# Patient Record
Sex: Female | Born: 1935 | Race: White | Hispanic: No | Smoking: Never smoker
Health system: Southern US, Community
[De-identification: ages and names within clinical notes are randomized; demographics above are authoritative.]

## PROBLEM LIST (undated history)

## (undated) DIAGNOSIS — F419 Anxiety disorder, unspecified: Secondary | ICD-10-CM

## (undated) DIAGNOSIS — J189 Pneumonia, unspecified organism: Secondary | ICD-10-CM

## (undated) DIAGNOSIS — I10 Essential (primary) hypertension: Secondary | ICD-10-CM

## (undated) DIAGNOSIS — F0390 Unspecified dementia without behavioral disturbance: Secondary | ICD-10-CM

## (undated) DIAGNOSIS — N3281 Overactive bladder: Secondary | ICD-10-CM

## (undated) DIAGNOSIS — I639 Cerebral infarction, unspecified: Secondary | ICD-10-CM

## (undated) HISTORY — PX: JOINT REPLACEMENT: SHX530

## (undated) HISTORY — PX: HIP ADDUCTOR TENOTOMY: SHX1747

---

## 2000-07-25 ENCOUNTER — Ambulatory Visit (HOSPITAL_COMMUNITY): Admission: RE | Admit: 2000-07-25 | Discharge: 2000-07-25 | Payer: Self-pay | Admitting: Ophthalmology

## 2001-10-30 ENCOUNTER — Ambulatory Visit (HOSPITAL_COMMUNITY): Admission: RE | Admit: 2001-10-30 | Discharge: 2001-10-30 | Payer: Self-pay | Admitting: Ophthalmology

## 2002-02-18 ENCOUNTER — Other Ambulatory Visit: Admission: RE | Admit: 2002-02-18 | Discharge: 2002-02-18 | Payer: Self-pay | Admitting: Obstetrics & Gynecology

## 2004-01-17 ENCOUNTER — Inpatient Hospital Stay (HOSPITAL_COMMUNITY): Admission: EM | Admit: 2004-01-17 | Discharge: 2004-01-20 | Payer: Self-pay | Admitting: Emergency Medicine

## 2004-01-20 ENCOUNTER — Ambulatory Visit: Payer: Self-pay | Admitting: Internal Medicine

## 2006-02-22 ENCOUNTER — Emergency Department (HOSPITAL_COMMUNITY): Admission: EM | Admit: 2006-02-22 | Discharge: 2006-02-22 | Payer: Self-pay | Admitting: Emergency Medicine

## 2006-06-16 ENCOUNTER — Emergency Department (HOSPITAL_COMMUNITY): Admission: EM | Admit: 2006-06-16 | Discharge: 2006-06-16 | Payer: Self-pay | Admitting: Emergency Medicine

## 2007-05-14 ENCOUNTER — Ambulatory Visit (HOSPITAL_COMMUNITY): Admission: RE | Admit: 2007-05-14 | Discharge: 2007-05-14 | Payer: Self-pay | Admitting: Pulmonary Disease

## 2007-07-17 ENCOUNTER — Inpatient Hospital Stay (HOSPITAL_COMMUNITY): Admission: EM | Admit: 2007-07-17 | Discharge: 2007-07-23 | Payer: Self-pay | Admitting: Emergency Medicine

## 2008-12-06 ENCOUNTER — Encounter: Payer: Self-pay | Admitting: Emergency Medicine

## 2008-12-07 ENCOUNTER — Inpatient Hospital Stay (HOSPITAL_COMMUNITY): Admission: RE | Admit: 2008-12-07 | Discharge: 2008-12-12 | Payer: Self-pay | Admitting: Internal Medicine

## 2008-12-10 ENCOUNTER — Ambulatory Visit: Payer: Self-pay | Admitting: Physical Medicine & Rehabilitation

## 2008-12-12 ENCOUNTER — Inpatient Hospital Stay (HOSPITAL_COMMUNITY)
Admission: RE | Admit: 2008-12-12 | Discharge: 2008-12-18 | Payer: Self-pay | Admitting: Physical Medicine & Rehabilitation

## 2008-12-12 ENCOUNTER — Ambulatory Visit: Payer: Self-pay | Admitting: Physical Medicine & Rehabilitation

## 2008-12-18 ENCOUNTER — Inpatient Hospital Stay: Admission: AD | Admit: 2008-12-18 | Discharge: 2009-02-05 | Payer: Self-pay | Admitting: Pulmonary Disease

## 2010-03-09 ENCOUNTER — Inpatient Hospital Stay (HOSPITAL_COMMUNITY)
Admission: RE | Admit: 2010-03-09 | Discharge: 2010-03-15 | DRG: 563 | Disposition: A | Discharge status: Skilled Nursing Facility | Payer: Medicare Other | Source: Ambulatory Visit | Attending: Pulmonary Disease | Admitting: Pulmonary Disease

## 2010-03-09 ENCOUNTER — Emergency Department (HOSPITAL_COMMUNITY): Payer: Medicare Other

## 2010-03-09 DIAGNOSIS — F3289 Other specified depressive episodes: Secondary | ICD-10-CM | POA: Diagnosis present

## 2010-03-09 DIAGNOSIS — E785 Hyperlipidemia, unspecified: Secondary | ICD-10-CM | POA: Diagnosis present

## 2010-03-09 DIAGNOSIS — N390 Urinary tract infection, site not specified: Secondary | ICD-10-CM | POA: Diagnosis present

## 2010-03-09 DIAGNOSIS — S42253A Displaced fracture of greater tuberosity of unspecified humerus, initial encounter for closed fracture: Secondary | ICD-10-CM | POA: Diagnosis present

## 2010-03-09 DIAGNOSIS — W19XXXA Unspecified fall, initial encounter: Secondary | ICD-10-CM | POA: Diagnosis present

## 2010-03-09 DIAGNOSIS — F329 Major depressive disorder, single episode, unspecified: Secondary | ICD-10-CM | POA: Diagnosis present

## 2010-03-09 DIAGNOSIS — Y92009 Unspecified place in unspecified non-institutional (private) residence as the place of occurrence of the external cause: Secondary | ICD-10-CM

## 2010-03-09 DIAGNOSIS — Z8673 Personal history of transient ischemic attack (TIA), and cerebral infarction without residual deficits: Secondary | ICD-10-CM

## 2010-03-09 DIAGNOSIS — S42213A Unspecified displaced fracture of surgical neck of unspecified humerus, initial encounter for closed fracture: Principal | ICD-10-CM | POA: Diagnosis present

## 2010-03-09 DIAGNOSIS — F411 Generalized anxiety disorder: Secondary | ICD-10-CM | POA: Diagnosis present

## 2010-03-09 DIAGNOSIS — I10 Essential (primary) hypertension: Secondary | ICD-10-CM | POA: Diagnosis present

## 2010-03-10 ENCOUNTER — Encounter: Payer: Self-pay | Admitting: Orthopedic Surgery

## 2010-03-10 DIAGNOSIS — S42213A Unspecified displaced fracture of surgical neck of unspecified humerus, initial encounter for closed fracture: Secondary | ICD-10-CM

## 2010-03-11 LAB — URINALYSIS, ROUTINE W REFLEX MICROSCOPIC
Bilirubin Urine: NEGATIVE
Nitrite: POSITIVE — AB
Specific Gravity, Urine: >1.03 — ABNORMAL HIGH (ref 1.005–1.030)
Urine Glucose, Fasting: NEGATIVE mg/dL
Urobilinogen, UA: 0.2 mg/dL (ref 0.0–1.0)
pH: 5.5 (ref 5.0–8.0)

## 2010-03-11 LAB — URINE MICROSCOPIC-ADD ON

## 2010-03-12 LAB — CBC
HCT: 34.7 % — ABNORMAL LOW (ref 36.0–46.0)
Hemoglobin: 11.6 g/dL — ABNORMAL LOW (ref 12.0–15.0)
MCH: 30.8 pg (ref 26.0–34.0)
MCHC: 33.4 g/dL (ref 30.0–36.0)
RBC: 3.77 MIL/uL — ABNORMAL LOW (ref 3.87–5.11)
RDW: 12.2 % (ref 11.5–15.5)
WBC: 9.1 10*3/uL (ref 4.0–10.5)

## 2010-03-12 LAB — DIFFERENTIAL
Basophils Absolute: 0 10*3/uL (ref 0.0–0.1)
Basophils Relative: 0 % (ref 0–1)
Eosinophils Absolute: 0 10*3/uL (ref 0.0–0.7)
Eosinophils Relative: 0 % (ref 0–5)
Lymphocytes Relative: 15 % (ref 12–46)
Lymphs Abs: 1.4 10*3/uL (ref 0.7–4.0)
Monocytes Absolute: 1 10*3/uL (ref 0.1–1.0)
Monocytes Relative: 12 % (ref 3–12)
Neutro Abs: 6.6 10*3/uL (ref 1.7–7.7)
Neutrophils Relative %: 73 % (ref 43–77)

## 2010-03-12 LAB — COMPREHENSIVE METABOLIC PANEL
ALT: 10 U/L (ref 0–35)
AST: 19 U/L (ref 0–37)
CO2: 27 mEq/L (ref 19–32)
Calcium: 9 mg/dL (ref 8.4–10.5)
Chloride: 100 mEq/L (ref 96–112)
GFR calc Af Amer: >60 mL/min (ref >60)
GFR calc non Af Amer: >60 mL/min (ref >60)
Glucose, Bld: 142 mg/dL — ABNORMAL HIGH (ref 70–99)
Sodium: 137 mEq/L (ref 135–145)
Total Bilirubin: 1.9 mg/dL — ABNORMAL HIGH (ref 0.3–1.2)
Total Protein: 7.1 g/dL (ref 6.0–8.3)

## 2010-03-14 LAB — URINE CULTURE
Colony Count: >=100000
Culture  Setup Time: 201202240151

## 2010-03-15 ENCOUNTER — Inpatient Hospital Stay
Admission: RE | Admit: 2010-03-15 | Discharge: 2010-04-17 | Disposition: A | Discharge status: Home / Self Care | Payer: Medicare Other | Source: Ambulatory Visit | Attending: Pulmonary Disease | Admitting: Pulmonary Disease

## 2010-03-15 ENCOUNTER — Encounter: Payer: Self-pay | Admitting: Orthopedic Surgery

## 2010-03-15 DIAGNOSIS — Z8701 Personal history of pneumonia (recurrent): Secondary | ICD-10-CM

## 2010-03-15 DIAGNOSIS — T148XXA Other injury of unspecified body region, initial encounter: Principal | ICD-10-CM

## 2010-03-15 DIAGNOSIS — R05 Cough: Secondary | ICD-10-CM

## 2010-03-16 NOTE — Discharge Summary (Signed)
  [name], [name]               ACCOUNT NO.:  [id number]  MEDICAL RECORD NO.:  [id number]           PATIENT TYPE:  I  LOCATION:  [id number]                          FACILITY:  [facility]  PHYSICIAN:  Deanza Upperman L. Luan Pulling, M.D.DATE OF BIRTH:  06/08/1920  DATE OF ADMISSION:  03/09/2010 DATE OF DISCHARGE:  LH                         DISCHARGE SUMMARY-REFERRING   [name] is potentially being transferred to a skilled care facility today.  HISTORY:  [name] is a 77 year old who has a fractured humerus. Her discharge diagnoses are: 1. Fractured humerus. 2. Status post hip fracture. 3. Status post knee fracture. 4. Inability to ambulate. 5. Urinary tract infection. 6. Hypertension. 7. Anxiety. 8. History of cerebrovascular accident. 9. Hyperlipidemia. 10.Depression.  This is a 77 year old who fell at home and suffered a fracture of her humerus.  [name] had been having severe problems with her leg.  [name] is not able to ambulate without using a walker and [name] cannot use the walker with her fracture in the humerus.  [name] has a history of CVA in the past. [name] is complaining of severe pain.  [name] has not been able to ambulate even with her walker because [name] cannot use it.  PHYSICAL EXAMINATION:  GENERAL:  [name] is very anxious, uncomfortable, complaining of pain.  [name] also complained of depression.  Her shoulder was in an immobilizer. CHEST:  Relatively clear. HEART:  Regular without gallop. ABDOMEN:  Bowel sounds present and active.  HOSPITAL COURSE:  [name] had IV fluids.  [name] was started on nerve medications, started on medications for depression.  [name] was given IV pain medication, had Orthopedic consultation.  The orthopedist said that [name] was not going to be able to bear any weight on that shoulder for at least 2-3 weeks.  This imposes a great deal of difficulty in that [name] has to use a walker to ambulate.  [name] can use the walker with her shoulder in the immobilizer and with no  weightbearing.  [name] did complain of urinary tract infection.  [name] complained of some discomfort in her abdomen after [name] was started on antibiotics and at this point, plans are for her to be transferred to a skilled care facility on a no added salt regular diet.  [name] will have PT, OT, and speech as needed.  [name] will have: 1. Amlodipine 10 mg daily. 2. Benazepril 20 mg daily. 3. Protonix 40 mg daily. 4. Zoloft 50 mg daily. 5. Xanax 0.25 mg q.4 h. p.r.n. pain. 6. Tums 2 q.i.d. p.r.n. indigestion. 7. Hydrocodone 10/325 q.4 h. p.r.n. more severe pain. 8. Zofran 4 mg p.o. q.6 h. p.r.n. nausea. 9. [name] will be on Cipro 250 mg p.o. b.i.d. x5 more days.     Fransisca Shawn L. Luan Pulling, M.D.     ELH/MEDQ  D:  03/12/2010  T:  03/12/2010  Job:  [id number]  Electronically Signed by Sinda Du M.D. on 03/16/2010 05:57:39 PM

## 2010-03-16 NOTE — Discharge Summary (Signed)
  NAMECARLEENA, NEUPERT               ACCOUNT NO.:  [id number]  MEDICAL RECORD NO.:  [id number]           PATIENT TYPE:  LOCATION:                                 FACILITY:  PHYSICIAN:  Quentina Fronek L. Luan Pulling, M.D.DATE OF BIRTH:  1921-01-03  DATE OF ADMISSION: DATE OF DISCHARGE:  LH                         DISCHARGE SUMMARY-REFERRING   ADDENDUM  This is an addendum to the discharge summary that was done earlier.  The patient was approved for inpatient admission.  She had some more problems with nausea, ended up having to take her Cipro IV initially, and then she had some more problems with depression.  All of that is better.  She is much improved, and she is ready for transfer to the skilled care facility on March 15, 2010.     Orella Cushman L. Luan Pulling, M.D.     ELH/MEDQ  D:  03/15/2010  T:  03/15/2010  Job:  [id number]  Electronically Signed by Sinda Du M.D. on 03/16/2010 05:57:47 PM

## 2010-03-16 NOTE — Progress Notes (Signed)
  [name], [name]               ACCOUNT NO.:  [id number]  MEDICAL RECORD NO.:  [id number]           PATIENT TYPE:  I  LOCATION:  [id number]                          FACILITY:  [facility]  PHYSICIAN:  Blessing Zaucha L. Luan Pulling, M.D.DATE OF BIRTH:  01-31-1920  DATE OF PROCEDURE: DATE OF DISCHARGE:                                PROGRESS NOTE   Ms. [name] was admitted with a fractured left humerus.  She is not able to use her arm at all.  She also had a urinary tract infection.  She seems to be doing a little bit better, although she still complains of pretty severe nausea.  Her exam otherwise shows that her temperature is 98.2, pulse 70, respirations 18, blood pressure 115/76, O2 sats 96% on 2 liters.  Her chest is clear.  Heart is regular.  Abdomen is soft.  Extremities showed no edema and overall, I think she is better.  My assessment is that she seems to be somewhat better.  She still complaining of nausea.  She still cannot use her arm.  I am gratified that she has been approved for inpatient status and she is set for transfer to the [facility] on Monday, March 15, 2010.     Keasha Malkiewicz L. Luan Pulling, M.D.     ELH/MEDQ  D:  03/13/2010  T:  03/13/2010  Job:  [id number]  Electronically Signed by Sinda Du M.D. on 03/16/2010 05:58:00 PM

## 2010-03-16 NOTE — Progress Notes (Signed)
  NAMEROBERTA, [name]               ACCOUNT NO.:  [id number]  MEDICAL RECORD NO.:  [id number]           PATIENT TYPE:  I  LOCATION:  [id number]                          FACILITY:  [facility]  PHYSICIAN:  Ricka Westra L. Luan Pulling, M.D.DATE OF BIRTH:  10-21-1920  DATE OF PROCEDURE: DATE OF DISCHARGE:                                PROGRESS NOTE   This patient was admitted with a fracture.  She says she is doing a little better.  She clearly has problems with depression.  I think because of her multiple problems.  My plan then is to continue with her current treatments and medications.  PHYSICAL EXAMINATION:  GENERAL:  Her exam shows that she is awake and alert.  She is in a sling. VITAL SIGNS:  Temperature is 98.6, pulse 85, respirations 16, blood pressure 129/73.  Assessment is that she has had a fracture.  She has problems with her legs.  She has been using a walker.  She is not able to use a walker because of the fracture and we are working on placement issues.     [name] L. Luan Pulling, M.D.     ELH/MEDQ  D:  03/11/2010  T:  03/12/2010  Job:  [id number]  Electronically Signed by Sinda Du M.D. on 03/16/2010 05:57:50 PM

## 2010-03-16 NOTE — Progress Notes (Signed)
  [name], [name]               ACCOUNT NO.:  [id number]  MEDICAL RECORD NO.:  [id number]           PATIENT TYPE:  LOCATION:                                 FACILITY:  PHYSICIAN:  Genova Kiner L. Luan Pulling, M.D.DATE OF BIRTH:  1920-09-29  DATE OF PROCEDURE: DATE OF DISCHARGE:                                PROGRESS NOTE   ADDENDUM  [name] is complaining that the Cipro is bothering her stomach, so I am going to switch it to IV now, so she will be on IV Cipro at this point.     Aliha Diedrich L. Luan Pulling, M.D.     ELH/MEDQ  D:  03/12/2010  T:  03/12/2010  Job:  [id number]  Electronically Signed by Sinda Du M.D. on 03/16/2010 05:58:09 PM

## 2010-03-16 NOTE — Progress Notes (Addendum)
  [name], [name]               ACCOUNT NO.:  [id number]  MEDICAL RECORD NO.:  [id number]           PATIENT TYPE:  LOCATION:                                FACILITY:  [facility]  PHYSICIAN:  Tariyah Pendry L. Luan Pulling, M.D.DATE OF BIRTH:  [birth year]  DATE OF PROCEDURE:  03/15/2010 DATE OF DISCHARGE:                                PROGRESS NOTE   [name] is much improved this morning.  She has less complaints of depression.  She looks better and says she feels better.  We have got her set for transfer to the [facility] later today and I will see how she does there.     Zakaria Sedor L. Luan Pulling, M.D.     ELH/MEDQ  D:  03/15/2010  T:  03/15/2010  Job:  [id number]  Electronically Signed by Sinda Du M.D. on 03/16/2010 05:58:15 PM

## 2010-03-16 NOTE — H&P (Signed)
  NAMEBRITNY, [name]               ACCOUNT NO.:  [id number]  MEDICAL RECORD NO.:  [id number]           PATIENT TYPE:  I  LOCATION:  [id number]                          FACILITY:  [facility]  PHYSICIAN:  Mustafa Potts L. Luan Pulling, M.D.DATE OF BIRTH:  Nov 17, 1920  DATE OF ADMISSION:  03/09/2010 DATE OF DISCHARGE:  LH                             HISTORY & PHYSICAL   REASON FOR ADMISSION:  Fractured humerus.  HISTORY:  Ms. [name] is an 77 year old who has fallen at home and suffered a fracture of her humerus.  The difficulty has been that she also has severe problems with her leg and she has been using a walker at home and she cannot use the walker with the fracture.  She had had severe problems with her leg and eventually went home and has been using a walker and a knee immobilizer.  She has a history of CVA in the past as well.  She is having severe pain.  She is not able to ambulate even with her walker because she cannot use it.  PAST MEDICAL HISTORY:  Positive for hypertension, anxiety, previous CVA, and hyperlipidemia.  PAST SURGICAL HISTORY:  She has had a cholecystectomy and surgeries on her leg.  SOCIAL HISTORY:  She does not smoke.  She does not use any alcohol.  She does not use any illicit drugs.  MEDICATIONS:  At home, she takes Lotrel 10/20 daily and Valium 5 mg as needed.  REVIEW OF SYSTEMS:  Except as mentioned is negative.  PHYSICAL EXAMINATION:  GENERAL:  She is very anxious.  She is uncomfortable and complaining of pain.  Her shoulders is in an immobilizer. HEENT:  Her pupils are reactive.  Nose and throat are clear.  Mucous membranes are moist. NECK:  Supple without masses. CHEST:  Fairly clear without wheezes, rales, or rhonchi. HEART:  Regular without murmur, gallop, or rub. ABDOMEN:  Soft with no masses.  Bowel sounds are present and active. EXTREMITIES:  She is in an immobilizer.  She is tender in the shoulder.  ASSESSMENT:  She has problems with ambulation now.  She  had a fracture. I do not think she is going to be able to manage it at home and I think she is probably going to end up having to be back in a skilled care facility, at least short-term until this heals.  PLAN:  Go ahead and get Orthopedic consultation.  I am going to give her some IV fluids.  Give her something for her nerves, start her on something for depression.  Continue with everything else and follow.     Naiyah Klostermann L. Luan Pulling, M.D.     ELH/MEDQ  D:  03/10/2010  T:  03/11/2010  Job:  [id number]  Electronically Signed by Sinda Du M.D. on 03/16/2010 05:57:36 PM

## 2010-03-16 NOTE — Progress Notes (Signed)
  NAMENERISSA, STARR               ACCOUNT NO.:  [id number]  MEDICAL RECORD NO.:  [id number]           PATIENT TYPE:  I  LOCATION:  [id number]                          FACILITY:  [facility]  PHYSICIAN:  Jing Howatt L. Luan Pulling, M.D.DATE OF BIRTH:  [birth year]  DATE OF PROCEDURE: DATE OF DISCHARGE:                                PROGRESS NOTE   Ms. Lindblom was overall about the same.  She is more depressed this morning.  I have told her that I think we need to simply push through this.  I understand her problem, but that she was much better before.  I think she had more problems with pain, so I am going to switch her from her current morphine to some Dilaudid and see if that helps.  Otherwise, everything is about the same and she has no other new complaints.  Exam shows that her temperature is 98.5, pulse 79, respirations 24, blood pressure 135/68, O2 sats 98% on 2 L.  Her chest is clear.  She looks fairly comfortable, but she is obviously very depressed.  ASSESSMENT:  She has got lot of problems with depression.  She has pain in her arm.  I am going to have her change her pain medication.  She is going to stay on the Zoloft and see how she does.     Rubin Dais L. Luan Pulling, M.D.     ELH/MEDQ  D:  03/14/2010  T:  03/14/2010  Job:  [id number]  Electronically Signed by Sinda Du M.D. on 03/16/2010 05:58:03 PM

## 2010-03-16 NOTE — Progress Notes (Signed)
  NAMEMEGEN, SHECKLER               ACCOUNT NO.:  [id number]  MEDICAL RECORD NO.:  [id number]           PATIENT TYPE:  I  LOCATION:  [id number]                          FACILITY:  [facility]  PHYSICIAN:  Averie Meiner L. Luan Pulling, M.D.DATE OF BIRTH:  16-Jan-1921  DATE OF PROCEDURE: DATE OF DISCHARGE:                                PROGRESS NOTE   I have reviewed all the information on Ms. Joens.  She appears to have a urinary tract infection.  She did not have any lab work done when she came into the hospital, I am going to order a CBC and CMET today.  She is very weak.  She certainly could be hypokalemic.  My concern is the fact that she lives alone.  She already was using a walker for ambulation and even with that was somewhat unsteady as evidenced by her fall.  Now, she has a fractured humerus and according to Dr. Ruthe Mannan note, she is not going to be able to bear weight on her humerus for at least 2-3 weeks and perhaps longer than that.  I simply do not see any way that she is going to be able to be anywhere other than a skilled care facility to receive PT, OT, etc.  With her inability to walk at home obviously, she is unable to prepare meals, she is unable to toilet herself, etc.  She needs to be in the hospital in my opinion.  I do not think that having her at home is safe and I am concerned about her well- being.  I understand they are concerned about whether she should be considered a "observation" patient or a full inpatient, but I think with the patient who cannot ambulate, who cannot use her left arm, who cannot get out of bed, who has a urinary tract infection and multiple other comorbidities including severe depression.  I do not think that we cannot have her fully admitted as an inpatient.  I discussed the situation with the medical director for her insurance in our facility yesterday, but I see no other choice.     Chundra Sauerwein L. Luan Pulling, M.D.     ELH/MEDQ  D:  03/12/2010  T:   03/12/2010  Job:  [id number]  Electronically Signed by Sinda Du M.D. on 03/16/2010 05:57:55 PM

## 2010-03-21 ENCOUNTER — Inpatient Hospital Stay (HOSPITAL_COMMUNITY): Payer: PRIVATE HEALTH INSURANCE

## 2010-03-21 ENCOUNTER — Inpatient Hospital Stay (HOSPITAL_COMMUNITY): Payer: PRIVATE HEALTH INSURANCE | Attending: Pulmonary Disease

## 2010-03-21 ENCOUNTER — Ambulatory Visit (HOSPITAL_COMMUNITY)
Admission: RE | Admit: 2010-03-21 | Discharge: 2010-03-21 | Disposition: A | Discharge status: Home / Self Care | Payer: Medicare Other | Source: Ambulatory Visit | Attending: Pulmonary Disease | Admitting: Pulmonary Disease

## 2010-03-23 NOTE — Consult Note (Addendum)
[name], [name]               ACCOUNT NO.:  [id number]  MEDICAL RECORD NO.:  [id number]           PATIENT TYPE:  I  LOCATION:  [id number]                          FACILITY:  [facility]  PHYSICIAN:  Carole Civil, M.D.DATE OF BIRTH:  09/15/1920  DATE OF CONSULTATION:  03/10/2010 DATE OF DISCHARGE:                                CONSULTATION   REQUESTING PHYSICIAN:  Jasper Loser. Luan Pulling, MD  DATE OF INJURY:  March 09, 2010  CHIEF COMPLAINT:  Pain, left shoulder.  The patient is noted to have had previous right distal femur fracture ORIF in November.  She had a right subcapital hip fracture, treated with bipolar prosthesis.  She is 77 years old.  Hip fracture was in 2009, fixed by Dr. Luna Glasgow.  Distal femur fracture was fixed in [place]. Had a history of stroke with right upper extremity mild weakness in 2005.  She had some postoperative chronic anemia, E. coli urinary tract infection, hypertension, hyperlipidemia.  She tells me she lives alone at home.  She has had a family history of coronary artery disease.  REVIEW OF SYSTEMS:  Negative.  She ambulates with a walker.  PHYSICAL EXAMINATION:  GENERAL APPEARANCE:  She is of small frame. Normal upper extremity pulse, perfusion, capillary refill.  Strong radial and ulnar pulse. NECK:  Cervical area lympha nodes normal. SKIN:  Bruising in the proximal humeral area.  Elbow, wrist, and hand normal. NEUROLOGIC:  Normal sensation in the left upper extremity. The patient is awake, alert, and oriented x3.  Mood and affect normal. EXTREMITIES:  The left arm is in a sling.  She has minimal range of motion and painful range of motion with normal muscle tone, and the elbow, wrist, and hand are stable.  X-rays show a left proximal humerus fracture with involvement of the greater tuberosity and surgical neck.  The fracture meets the Neer criteria for nonoperative treatment.  Recommend sling for 2 weeks and repeat AP and lateral left  shoulder.  If fracture stable, the patient can weight bear with a platform walker at that point.  Until that point, however, she will not be able to use her left upper extremity for weightbearing.  This has been discussed with the patient and her family members who were present.  Call [phone] if there are any questions regarding treatment of the fracture.     Carole Civil, M.D.     SEH/MEDQ  D:  03/10/2010  T:  03/11/2010  Job:  [id number]  Electronically Signed by Arther Abbott M.D. on 03/23/2010 11:40:43 AM Electronically Signed by Arther Abbott M.D. on 03/23/2010 12:00:33 PM Electronically Signed by Arther Abbott M.D. on 03/23/2010 12:15:00 PM Electronically Signed by Arther Abbott M.D. on 03/23/2010 12:30:57 PM Electronically Signed by Arther Abbott M.D. on 03/23/2010 12:48:23 PM Electronically Signed by Arther Abbott M.D. on 03/23/2010 [id number] PM Electronically Signed by Arther Abbott M.D. on 03/23/2010 01:27:00 PM Electronically Signed by Arther Abbott M.D. on 03/23/2010 01:48:17 PM Electronically Signed by Arther Abbott M.D. on 03/23/2010 02:08:03 PM Electronically Signed by Arther Abbott M.D. on 03/23/2010 02:28:39 PM Electronically Signed by Arther Abbott M.D. on 03/23/2010 02:51:41  PM Electronically Signed by Arther Abbott M.D. on 03/23/2010 03:15:57 PM Electronically Signed by Arther Abbott M.D. on 03/23/2010 03:40:41 PM Electronically Signed by Arther Abbott M.D. on 03/23/2010 04:17:06 PM Electronically Signed by Arther Abbott M.D. on 03/23/2010 04:48:09 PM Electronically Signed by Arther Abbott M.D. on 03/23/2010 07:32:31 PM

## 2010-03-31 ENCOUNTER — Ambulatory Visit (HOSPITAL_COMMUNITY): Payer: Medicare Other | Attending: Pulmonary Disease

## 2010-03-31 ENCOUNTER — Telehealth: Payer: Self-pay | Admitting: Orthopedic Surgery

## 2010-03-31 ENCOUNTER — Encounter: Payer: Self-pay | Admitting: Orthopedic Surgery

## 2010-03-31 ENCOUNTER — Ambulatory Visit (HOSPITAL_COMMUNITY): Payer: Medicare Other

## 2010-03-31 DIAGNOSIS — M25519 Pain in unspecified shoulder: Secondary | ICD-10-CM | POA: Insufficient documentation

## 2010-04-05 ENCOUNTER — Ambulatory Visit: Payer: Medicare Other | Admitting: Orthopedic Surgery

## 2010-04-05 ENCOUNTER — Encounter: Payer: Self-pay | Admitting: Orthopedic Surgery

## 2010-04-06 NOTE — Progress Notes (Signed)
Summary: call from [place] following Xray   Phone Note Other Incoming   Caller: [facility] Summary of Call: Inez Catalina from [place] - Ph [phone] - called to relay that patient was taken for her Xray today, Forestine Na.  Copy of Xray report faxed and scanned into chart.  Asking for Dr to review and advise.  Initial call taken by: Ihor Austin,  March 31, 2010 2:47 PM  Follow-up for Phone Call        i dont know does she needa n appt ????? Follow-up by: Arther Abbott MD,  March 31, 2010 3:03 PM  Additional Follow-up for Phone Call Additional follow up Details #1::        Consult note in chart dated 03/10/10 states (after this repeat Xray)   "if fracture stable, patient may weight bear with a platform walker at that point, however, until that point will not be able to use her left upper extremity for weight bearing." ( Patient also had hx of previous RT distal femur FX OR)IF in November 2011.  Please review Xray of 03/31/10 and confirm if appointment needed or not needed. Additional Follow-up by: Ihor Austin,  March 31, 2010 6:20 PM    Additional Follow-up for Phone Call Additional follow up Details #2::    patient may weight bear with a platform walker Follow-up by: Arther Abbott MD,  April 01, 2010 8:47 AM  Additional Follow-up for Phone Call Additional follow up Details #3:: Details for Additional Follow-up Action Taken: called back to [facility]; spoke with patient's nurse, Sharyn Lull; advised per above. Additional Follow-up by: Ihor Austin,  April 01, 2010 5:07 PM

## 2010-04-06 NOTE — Consult Note (Signed)
Summary: Hospital consult LT shoulder  Hospital consult LT shoulder   Imported By: Ihor Austin 03/31/2010 13:41:58  _____________________________________________________________________  External Attachment:    Type:   Image     Comment:   External Document

## 2010-04-08 NOTE — H&P (Signed)
  [name], [name]               ACCOUNT NO.:  [id number]  MEDICAL RECORD NO.:  [id number]           PATIENT TYPE:  I  LOCATION:  S104                          FACILITY:  [facility]  PHYSICIAN:  Leola Fiore L. Luan [name], M.D.DATE OF BIRTH:  [birth year]  DATE OF ADMISSION:  03/15/2010 DATE OF DISCHARGE:  LH                             HISTORY & PHYSICAL   The patient at the [facility].  [name] was admitted to the [facility] because she has a fractured humerus.  She has also had severe problems with her leg.  She is admitted for rehabilitation.  She had some problems with anxiety and depression, but that seems to be a little bit better now.  Her past medical history is positive for previous hip fracture, previous fracture in her knee for the humeral fracture, anxiety, and depression.  Her social history, she has been living at home alone.  She has a daughter who is about 25 miles away that who visits frequently.  She does not smoke.  She does not use any alcohol.  Does not use any illicit drugs.  FAMILY HISTORY:  Not known to be positive for osteoporosis.  REVIEW OF SYSTEMS:  Except as mentioned is negative.  PHYSICAL EXAMINATION:  GENERAL:  She is a well-developed, well-nourished female who is in no acute distress.  She has a sling on her left arm. HEENT:  Her pupils are reactive.  Nose and throat are clear.  Mucous membranes are moist. CHEST:  Shows some minimal rhonchi. HEART:  Regular. ABDOMEN:  Soft.  ASSESSMENT:  She had a fracture in the humerus.  She has difficulty with managing her walker.  She has been admitted to the skilled care facility for rehabilitation.     [name], M.D.    ELH/MEDQ  D:  03/17/2010  T:  03/17/2010  Job:  [id number]  Electronically Signed by Sinda Du M.D. on 04/08/2010 09:11:15 AM

## 2010-04-08 NOTE — Progress Notes (Signed)
  [name], [name]               ACCOUNT NO.:  [id number]  MEDICAL RECORD NO.:  [id number]           PATIENT TYPE:  LOCATION:                                 FACILITY:  PHYSICIAN:  Krosby Ritchie L. Luan Pulling, M.D.DATE OF BIRTH:  02/10/1920  DATE OF PROCEDURE:  04/04/2010 DATE OF DISCHARGE:                                PROGRESS NOTE   [name] is doing better.  She has no new complaints.  She is hopeful that she is going to be ready for discharge fairly soon.  She has no new problems.  Her arm seems to be healing, she is trying to work on learning how to walk with a walker with a raised platform.  Her chest is clear.  Heart is regular.  Abdomen is soft.  She looks pretty comfortable.  ASSESSMENT:  She has multiple problems including a fractured humerus. She has had severe problems with her leg.  She has anxiety.  She has depression in the past and overall I think she is better now.  My plan then is to continue with her treatment.  She is hopeful of going home fairly soon.  We will see what we can arrange as far as that is concerned.     Tyleek Smick L. Luan Pulling, M.D.     ELH/MEDQ  D:  04/04/2010  T:  04/04/2010  Job:  [id number]  Electronically Signed by Sinda Du M.D. on 04/08/2010 09:11:18 AM

## 2010-04-09 ENCOUNTER — Inpatient Hospital Stay (HOSPITAL_COMMUNITY): Payer: Medicare Other | Attending: Pulmonary Disease

## 2010-04-15 NOTE — Letter (Signed)
Summary: Disch Summ 03/15/10 and 03/09/10  Disch Summ 03/15/10 and 03/09/10   Imported By: Ihor Austin 04/04/2010 11:45:22  _____________________________________________________________________  External Attachment:    Type:   Image     Comment:   External Document

## 2010-04-20 LAB — PROTIME-INR
INR: 1.53 — ABNORMAL HIGH (ref 0.00–1.49)
Prothrombin Time: 20.8 seconds — ABNORMAL HIGH (ref 11.6–15.2)

## 2010-04-20 NOTE — Miscellaneous (Signed)
Summary: Nursing Home orders  Nursing Home orders   Imported By: Ihor Austin 04/13/2010 19:29:59  _____________________________________________________________________  External Attachment:    Type:   Image     Comment:   External Document

## 2010-04-21 LAB — URINE MICROSCOPIC-ADD ON

## 2010-04-21 LAB — COMPREHENSIVE METABOLIC PANEL
AST: 15 U/L (ref 0–37)
Albumin: 3.1 g/dL — ABNORMAL LOW (ref 3.5–5.2)
Calcium: 8.5 mg/dL (ref 8.4–10.5)
Chloride: 105 mEq/L (ref 96–112)
Creatinine, Ser: 0.75 mg/dL (ref 0.4–1.2)
GFR calc Af Amer: >60 mL/min (ref >60)
Total Protein: 5.7 g/dL — ABNORMAL LOW (ref 6.0–8.3)

## 2010-04-21 LAB — CBC
HCT: 38.6 % (ref 36.0–46.0)
HCT: 26.7 % — ABNORMAL LOW (ref 36.0–46.0)
HCT: 28.8 % — ABNORMAL LOW (ref 36.0–46.0)
Hemoglobin: 13.3 g/dL (ref 12.0–15.0)
Hemoglobin: 9.3 g/dL — ABNORMAL LOW (ref 12.0–15.0)
MCHC: 34.5 g/dL (ref 30.0–36.0)
MCHC: 34.6 g/dL (ref 30.0–36.0)
MCHC: 34.8 g/dL (ref 30.0–36.0)
MCHC: 35.2 g/dL (ref 30.0–36.0)
MCV: 93.8 fL (ref 78.0–100.0)
MCV: 94.2 fL (ref 78.0–100.0)
MCV: 94.5 fL (ref 78.0–100.0)
MCV: 94.6 fL (ref 78.0–100.0)
Platelets: 350 10*3/uL (ref 150–400)
RBC: 4.11 MIL/uL (ref 3.87–5.11)
RBC: 3.05 MIL/uL — ABNORMAL LOW (ref 3.87–5.11)
RDW: 12.1 % (ref 11.5–15.5)
WBC: 12.4 10*3/uL — ABNORMAL HIGH (ref 4.0–10.5)
WBC: 7 10*3/uL (ref 4.0–10.5)
WBC: 7.1 10*3/uL (ref 4.0–10.5)

## 2010-04-21 LAB — DIFFERENTIAL
Basophils Relative: 0 % (ref 0–1)
Eosinophils Absolute: 0 10*3/uL (ref 0.0–0.7)
Eosinophils Relative: 0 % (ref 0–5)
Eosinophils Relative: 4 % (ref 0–5)
Lymphocytes Relative: 24 % (ref 12–46)
Lymphs Abs: 2.4 10*3/uL (ref 0.7–4.0)
Lymphs Abs: 1.7 10*3/uL (ref 0.7–4.0)
Monocytes Absolute: 0.6 10*3/uL (ref 0.1–1.0)
Monocytes Absolute: 0.8 10*3/uL (ref 0.1–1.0)
Monocytes Relative: 5 % (ref 3–12)
Monocytes Relative: 12 % (ref 3–12)

## 2010-04-21 LAB — BASIC METABOLIC PANEL
BUN: 8 mg/dL (ref 6–23)
CO2: 26 mEq/L (ref 19–32)
CO2: 29 mEq/L (ref 19–32)
CO2: 31 mEq/L (ref 19–32)
Chloride: 104 mEq/L (ref 96–112)
Chloride: 101 mEq/L (ref 96–112)
GFR calc Af Amer: >60 mL/min (ref >60)
GFR calc Af Amer: >60 mL/min (ref >60)
Glucose, Bld: 116 mg/dL — ABNORMAL HIGH (ref 70–99)
Potassium: 3.1 mEq/L — ABNORMAL LOW (ref 3.5–5.1)
Potassium: 3.4 mEq/L — ABNORMAL LOW (ref 3.5–5.1)
Potassium: 4 mEq/L (ref 3.5–5.1)
Sodium: 136 mEq/L (ref 135–145)

## 2010-04-21 LAB — URINALYSIS, MICROSCOPIC ONLY
Glucose, UA: NEGATIVE mg/dL
Hgb urine dipstick: NEGATIVE
Specific Gravity, Urine: 1.021 (ref 1.005–1.030)
Urobilinogen, UA: 0.2 mg/dL (ref 0.0–1.0)

## 2010-04-21 LAB — URINE CULTURE: Culture: NO GROWTH

## 2010-04-21 LAB — PROTIME-INR
INR: 0.98 (ref 0.00–1.49)
INR: 1.91 — ABNORMAL HIGH (ref 0.00–1.49)
INR: 2.32 — ABNORMAL HIGH (ref 0.00–1.49)
INR: 2.4 — ABNORMAL HIGH (ref 0.00–1.49)
Prothrombin Time: 25.3 seconds — ABNORMAL HIGH (ref 11.6–15.2)
Prothrombin Time: 26 seconds — ABNORMAL HIGH (ref 11.6–15.2)

## 2010-04-21 LAB — URINALYSIS, ROUTINE W REFLEX MICROSCOPIC
Bilirubin Urine: NEGATIVE
Specific Gravity, Urine: 1.025 (ref 1.005–1.030)
Urobilinogen, UA: 0.2 mg/dL (ref 0.0–1.0)
pH: 6 (ref 5.0–8.0)

## 2010-06-01 NOTE — Group Therapy Note (Signed)
NAMECELIE, GIROLAMO               ACCOUNT NO.:  [id number]   MEDICAL RECORD NO.:  [id number]          PATIENT TYPE:  INP   LOCATION:  [id number]                          FACILITY:  [facility]   PHYSICIAN:  Edward L. Luan Pulling, M.D.DATE OF BIRTH:  Jun 13, 1920   DATE OF PROCEDURE:  DATE OF DISCHARGE:                                 PROGRESS NOTE   Ms. Romanek seen with broken hip and seems to be doing well.  She is not  comfortable, but she is doing well.   Her physical examination shows temperature is 98, pulse 78, respirations  20, blood pressure 118/60, O2 sats 99%.  Her chest is clear.  She looks  better.   ASSESSMENT:  She is doing better.   PLAN:  She is being set up for transfer to a skilled care facility, but  we do not have a bed as yet.      Edward L. Luan Pulling, M.D.  Electronically Signed     ELH/MEDQ  D:  07/20/2007  T:  07/20/2007  Job:  [id number]

## 2010-06-01 NOTE — Group Therapy Note (Signed)
[name], SHASTEEN               ACCOUNT NO.:  [id number]   MEDICAL RECORD NO.:  [id number]          PATIENT TYPE:  INP   LOCATION:  [id number]                          FACILITY:  [facility]   PHYSICIAN:  Edward L. Luan Pulling, M.D.DATE OF BIRTH:  1921-01-05   DATE OF PROCEDURE:  DATE OF DISCHARGE:                                 PROGRESS NOTE   Ms. [name] says that she got hot during the night and indeed her  temperature was 102.  This morning, her temperature was down to 97.1,  pulse was 83, respirations 16, blood pressure 107/52, and O2 sats 95% on  2 L.  Her chest is pretty clear.  O2 sats good.  She did have a culture  of the wound.   ASSESSMENT:  She has had a hip replacement.  She has had some fever now  without an obvious source.  I am going to go ahead and get blood  cultures, get a urine culture, and get a chest x-ray.  We will check a  CBC today and see what is going on with her hemoglobin level and her  white blood count.  Depending on results of all that, she may need an  antibiotics.  She may simply have atelectasis, but I am concerned about  the fact that she does have this fever.      Edward L. Luan Pulling, M.D.  Electronically Signed     ELH/MEDQ  D:  07/19/2007  T:  07/19/2007  Job:  [id number]

## 2010-06-01 NOTE — Group Therapy Note (Signed)
[name], [name]               ACCOUNT NO.:  [id number]   MEDICAL RECORD NO.:  [id number]          PATIENT TYPE:  INP   LOCATION:  [id number]                          FACILITY:  [facility]   PHYSICIAN:  Edward L. Luan Pulling, M.D.DATE OF BIRTH:  1920/06/04   DATE OF PROCEDURE:  07/18/2007  DATE OF DISCHARGE:                                 PROGRESS NOTE   Ms. [name] had her surgery done yesterday and seems to be doing pretty  well.  She has no new complaints.  She is anxious to start physical  therapy.  She does not like to wedge between her legs.  She does not  like the IV.  She does not like the O2.   PHYSICAL EXAMINATION:  Temperature is 99.1, pulse is 101, respirations  18, blood pressure 109/59.  Her O2 sat is 91% on 2 L, so I do not think  we can stop the oxygen.  Her chest is pretty clear.  Her heart is  regular.   She says that her pain is pretty well-controlled and rates it at about  5.  My plan then is to continue with current treatments.  She is to  start PT today and how she does with PT will determine whether she is  able to go home or whether she is going to need rehab stay.      Edward L. Luan Pulling, M.D.  Electronically Signed     ELH/MEDQ  D:  07/18/2007  T:  07/18/2007  Job:  [id number]

## 2010-06-01 NOTE — Consult Note (Signed)
NAMECHELCEY, [name]               ACCOUNT NO.:  [id number]   MEDICAL RECORD NO.:  [id number]          PATIENT TYPE:  EMS   LOCATION:  ED                            FACILITY:  [facility]   PHYSICIAN:  Edward L. Luan Pulling, M.D.DATE OF BIRTH:  1921/01/03   DATE OF CONSULTATION:  DATE OF DISCHARGE:                                 CONSULTATION   PATIENT OF:  Iona Hansen, MD   REASON FOR CONSULTATION:  Medical clearance for fractured hip.   HISTORY:  Ms. Fiest is an 77 year old who came into the emergency room  because of fall.  She fell about 2 in the morning.  She said she got up  through the bed to go to the bathroom.  She lost her balance, fell on to  the right side and felt something pop.  She called family eventually  when she came to her senses and had them bring her to the emergency room  where she was found to have a fractured hip.  She complained that her  pain is at 7-8/10.   PAST MEDICAL HISTORY:  Her past medical history is positive for  hypertension, anxiety, CVA, and hyperlipidemia.   PAST SURGICAL HISTORY:  Surgically she has had a cholecystectomy in the  past.   SOCIAL HISTORY:  She does not smoke.  She does not drink any alcohol.  She does not use any illicit drugs.  She lives at home alone.   MEDICATIONS AT HOME:  1. Lisinopril 40 mg daily.  2. Valium 5 mg as needed at bedtime.   ALLERGIES:  She is allergic to SULFA and PENICILLIN.   REVIEW OF SYSTEMS:  Except as mentioned is negative.  She has not had  any cough or congestion.  No other new symptoms.  No chest pain.   PHYSICAL EXAMINATION:  GENERAL:  Physical examination shows a thin, but  well-developed female who is in no acute distress now.  VITAL SIGNS:  Blood pressure 124/71, pulse is 96, respirations 20, and  temperature 97.5.  HEENT:  Her pupils are reactive.  Her nose and throat are clear.  NECK:  Her neck is supple without masses.  She does not have any bruits.  CHEST:  Her chest is clear without  wheezes.  HEART:  Her heart is regular without murmur, gallop, or rub.  ABDOMEN:  Her abdomen is soft.  No masses are felt.  EXTREMITIES:  I did not examined her right leg.  She does not have any  edema.  CENTRAL NERVOUS SYSTEM:  Examination is grossly intact.   LABORATORY DATA:  Electrocardiogram shows sinus rhythm, first-degree AV  block, no acute changes.   Chest x-ray shows no pneumonia, perhaps an area of atelectasis.  Hip  shows subcapital right femoral neck fracture, osteopenia.   She had a blood gas on room air that showed pH of 7.38, pCO2 of 39, pO2  of 53.9 and she since then had oxygen applied and had a nebulizer  treatment.  O2 saturation is in the 97% range now.  CBC shows white  count 14,100, hemoglobin 12.6, and platelets 218.  PT/INR 13.2 and 1.0  respectively, PTT is 25.  Her BMET, glucose 115, otherwise normal.  Urinalysis shows a small amount of blood and trace protein.   ASSESSMENT:  She has no contraindications to planned surgery and Dr.  Luna Glasgow think he can go ahead and do at this morning, which I think is  excellent to go ahead and get this accomplished as early as possible.      Edward L. Luan Pulling, M.D.  Electronically Signed     ELH/MEDQ  D:  07/17/2007  T:  07/17/2007  Job:  [id number]

## 2010-06-01 NOTE — Group Therapy Note (Signed)
NAMETIMEKO, TIDRICK               ACCOUNT NO.:  [id number]   MEDICAL RECORD NO.:  [id number]          PATIENT TYPE:  INP   LOCATION:  [id number]                          FACILITY:  [facility]   PHYSICIAN:  Edward L. Luan Pulling, M.D.DATE OF BIRTH:  05-12-1920   DATE OF PROCEDURE:  DATE OF DISCHARGE:                                 PROGRESS NOTE   Ms. Rempfer continues with a number of constitutional complaints.  She  is not sleeping or she is too sleepy.  She has been a little confused,  which I think is a combination of morphine, having had surgery, and  being out of her normal environment.   PHYSICAL EXAMINATION:  VITAL SIGNS:  Today shows a temperature 97.5,  pulse 81, respirations 20, blood pressure 133/71, and O2 saturation is  98%.  CHEST:  Clear.   She looks actually pretty comfortable, and I think she is doing well.  She is just not feeling as well as she would like.   ASSESSMENT:  I think she is doing okay.   PLAN:  Continue her treatment and medications.  She is to continue with  PT, etc.  She is going to need short-term nursing home placement, when  she goes home.      Edward L. Luan Pulling, M.D.  Electronically Signed     ELH/MEDQ  D:  07/21/2007  T:  07/21/2007  Job:  [id number]

## 2010-06-01 NOTE — Group Therapy Note (Signed)
Nicole, [name]               ACCOUNT NO.:  [id number]   MEDICAL RECORD NO.:  [id number]          PATIENT TYPE:  INP   LOCATION:  [id number]                          FACILITY:  [facility]   PHYSICIAN:  Edward L. Luan Pulling, M.D.DATE OF BIRTH:  [birth year]   DATE OF PROCEDURE:  DATE OF DISCHARGE:  07/23/2007                                 PROGRESS NOTE   PROBLEM:  Fractured hip.   SUBJECTIVE:  [name] is doing much better.  She has more movement of  her hip than she did yesterday and seems overall to be improved.  She  has no new complaints.  We are awaiting skilled care facility placement.   PHYSICAL EXAMINATION:  Her exam today shows her temperature is 98, pulse  82, respirations 20, blood pressure 152/61, O2 sats 99% on 2 liters.  She looks more comfortable.  Her chest is pretty clear.   ASSESSMENT:  She is better.   PLAN:  Continue with her treatment.  She is ready for placement on a  bedpan.      Edward L. Luan Pulling, M.D.  Electronically Signed     ELH/MEDQ  D:  07/23/2007  T:  07/23/2007  Job:  [id number]

## 2010-06-01 NOTE — Op Note (Signed)
[name], [name]               ACCOUNT NO.:  [id number]   MEDICAL RECORD NO.:  [id number]          PATIENT TYPE:  INP   LOCATION:  [id number]                          FACILITY:  [facility]   PHYSICIAN:  J. Sanjuana Kava, M.D. DATE OF BIRTH:  [birth year]   DATE OF PROCEDURE:  DATE OF DISCHARGE:                               OPERATIVE REPORT   PREOPERATIVE DIAGNOSIS:  Subcapital fracture of the right hip.   POSTOPERATIVE DIAGNOSIS:  Subcapital fracture of the right hip.   PROCEDURE:  Bipolar prosthesis on the right hip.   ANESTHESIA:  Spinal.   SURGEON:  J. Sanjuana Kava, M.D.   ESTIMATED BLOOD LOSS:  Less than 200 mL of blood loss.   DRAINS:  One large Hemovac drain.   INDICATIONS:  The patient fell this morning and injured her right hip  while going to the bathroom.  X-rays revealed subcapital fracture of the  right hip.  She was seen and evaluated in the emergency room.  Dr.  Luan Pulling has also seen her.  While she is at increased risk, she was  found to be a surgical candidate.  I have discussed with her the risks  and imponderables of the procedure.  She appears to understand, the  procedure as outlined.   DESCRIPTION OF PROCEDURE:  Seen in the holding area, the right hip was  identified as a correct surgical site.  She placed a mark on the right  hip and I placed a mark on the right hip.  She was brought to the  operating room.  She was given spinal anesthesia and transferred to the  fracture table.  She placed right side up, left side down decubitus  position, held in by supports.  She was prepped and draped in the usual  manner.   Had a generalized time-out, each members of surgical team and then  people in the room identified themselves.  We did have a Engineer, civil (consulting) present.  The nature of the procedure was discussed at length  at that time and estimated blood loss.  We have 2 units available.  The  patient was identified as [name] and we are doing the right hip for  bipolar hip prosthesis.  All instrumentations were seen to be present  and the drills were present.   Posterior approach was made to the hip.  Sciatic nerve was identified  and a Penrose drain placed around it.  Short external rotators were  identified, tagged, and cut.  Hip capsule was opened, culture obtained.  Femoral head was removed.  Femoral head measured 46 mm.  A box osteotome  was used and the canal was entered.  It was reamed up to a size 8 and  then rasped up to a size 8.  Femoral neck was cut accordingly.  A planer  was used.  A 0-neck and a 46 shaver was selected.  The hip was reduced.  Leg lengths appeared to be normal.  Prosthesis was stable and was  removed.  Acetabulum was cleaned of all debris.  Permanent prosthesis  inserted, a #8 femoral stem, a 0-neck,  and a 46 mm bipolar head was  selected.  Acetabulum was checked.  There was no debris.  Hip was  reduced.  Leg lengths looked good and was very stable.  X-rays were  taken and showed good position and alignment.  Short rotators were  reapproximated through previously applied tags.  Penrose drain was  removed after looking at the sciatic nerve and there was no apparent  injury.  Hemovac drain was placed on with 2-0 silk.  Fascial layer was  reapproximated using interrupted figure-of-eight #1 Bralon suture.  Subcutaneous tissue was closed in layers with 2-0 plain, and skin  staples were used on the skin.  Sterile dressing applied.  Abduction  pillow placed between the knees.  She tolerated it well.  She will go to  recovery room for recovery.           ______________________________  [name]. Sanjuana Kava, M.D.     JWK/MEDQ  D:  07/17/2007  T:  07/18/2007  Job:  [id number]   cc:   Percell Miller L. Luan Pulling, M.D.  Fax: [phone]   Gypsy Balsam. Olin Hauser, M.D.  Fax: [phone]

## 2010-06-01 NOTE — Discharge Summary (Signed)
[name], [name]               ACCOUNT NO.:  [id number]   MEDICAL RECORD NO.:  [id number]          PATIENT TYPE:  INP   LOCATION:  [id number]                          FACILITY:  [facility]   PHYSICIAN:  J. Sanjuana Kava, M.D. DATE OF BIRTH:  03-20-1920   DATE OF ADMISSION:  07/17/2007  DATE OF DISCHARGE:  LH                               DISCHARGE SUMMARY   DISCHARGE DIAGNOSIS:  Subcapital fracture of the right hip.   OTHER DIAGNOSES:  1. Chronic obstructive pulmonary disease.  2. Hypertension.   PROCEDURE PERFORMED:  Bipolar prosthesis on the right hip.   DATE OF SURGERY:  July 17, 2007   DATE OF ADMISSION:  July 07, 2007   PROGNOSIS:  Good.   DISPOSITION:  Awaiting nursing home placement.   DISCHARGE MEDICATIONS:  1. Enoxaparin 30 mg subcu daily for the next 14 days.  2. Potassium chloride 20 mEq p.o. b.i.d.  3. Darvocet-N 100 every 6 hours p.r.n. pain.   I will see the patient in my office in 1 month, x-rays at that time  prior to coming if she still in the nursing home.   WOUND CARE:  Jodell Cipro that are removed from her hip on the right on July  8 or July  9.  Steri-Strip the wound.   PHYSICAL THERAPY INSTRUCTIONS:  Use of a walker, toe-touch to  weightbearing as tolerated on the right.  Use an abduction pillow  between the knees while in bed.   BRIEF HISTORY:  The patient fell going to the bathroom on the morning of  admission.  She was seen in the emergency room early that morning and  went to surgery later that day as she was n.p.o.  Dr. Luan Pulling had seen  her and given medical clearance.  The patient is high risk due to her  age and other medical problems but found to be a good candidate.  She  underwent the procedure and tolerated it well.  She has been slow in  therapy secondary to nausea and weakness.  First postoperative day she  was afebrile, leg lengths were normal, labs were normal.  Second  postoperative day that evening she had a temperature of 102; it has not  risen since then.  Was evaluated with blood culture, urine cultures, and  these were all negative.  Chest x-ray was negative.  She has remained  afebrile since that time.  Again, she has been very slow in therapy,  walking only up to 10-15 feet at a time.  She complains of recurring  nausea at times.  Neurovascular has been intact, her labs are normal.  She did have an episode of decreased potassium and this has been  supplemented.  Will see her in the office in 1 month.  If she has any  difficulties she should contact me through the nursing home.                                            ______________________________  Iona Hansen, M.D.     JWK/MEDQ  D:  07/23/2007  T:  07/23/2007  Job:  [id number]

## 2010-06-01 NOTE — Group Therapy Note (Signed)
NAMEJAZMENE, HULING               ACCOUNT NO.:  [id number]   MEDICAL RECORD NO.:  [id number]          PATIENT TYPE:  INP   LOCATION:  [id number]                          FACILITY:  [facility]   PHYSICIAN:  Edward L. Luan Pulling, M.D.DATE OF BIRTH:  06/25/1920   DATE OF PROCEDURE:  DATE OF DISCHARGE:                                 PROGRESS NOTE   Patient of Dr. Luna Glasgow.   Ms. Dilg is overall I think better.  She is a less confused today,  more alert.  She is complaining of a foul taste in her mouth and she has  some lesions on her tongue that may be some yeast.  Otherwise, she is  doing about the same.  She seems to be eating a little bit better and I  think overall has improved somewhat.   Her physical examination today is for the patient encounter form.  Her  temperature is 97.7, pulse 84, respirations 20, blood pressure 131/99,  O2 sats 100% on 2 L, and she looks more comfortable today.  Her mouth is  as described.   Assessment is that she has had a hip fracture and is slowly improving  from that.  She has what may be some yeast in her mouth.  I am going to  have her take Duke's mixture for that.   PLAN:  I had the Duke's mixture, no other treatments.  No other change  in treatments today.  Continue her meds and follow.      Edward L. Luan Pulling, M.D.  Electronically Signed     ELH/MEDQ  D:  07/22/2007  T:  07/22/2007  Job:  [id number]

## 2010-06-04 NOTE — Procedures (Signed)
[name], [name] NO.:  [id number]   MEDICAL RECORD NO.:  [id number]          PATIENT TYPE:  INP   LOCATION:  [id number]                          FACILITY:  [facility]   PHYSICIAN:  Fay Records, M.D.    DATE OF BIRTH:  [birth year]   DATE OF PROCEDURE:  01/20/2004  DATE OF DISCHARGE:                                  ECHOCARDIOGRAM   REFERRING PHYSICIAN:  Dr. Luan Pulling   TEST INDICATION:  Patient is an 77 year old with history of right-sided  weakness.   2-D ECHOCARDIOGRAM WITH ECHOCARDIOGRAM DOPPLER:  Left ventricle is normal in  size with an end-diastolic dimension of 35 mm, end-systolic dimension of 22  mm.  The intraventricular septum is mildly thickened at 13 mm.  Posterior  wall is normal at 10 mm.  Left atrium is normal.  Right atrium and right  ventricle are normal in size.   The aortic valve is mildly thickened, not stenotic.  There is no  insufficiency.  Mitral valve was mildly thickened, not stenotic.  There is  mild insufficiency (1 out of 4).  Pulmonic valve is normal.  Tricuspid valve  is normal with trace insufficiency.   Overall LV systolic function is normal with an LV EF of 65-70%.  RV systolic  function is also normal.   No pericardial effusion is seen.   No obvious thrombus.      PVR/MEDQ  D:  01/20/2004  T:  01/20/2004  Job:  [id number]

## 2010-06-04 NOTE — Group Therapy Note (Signed)
NAMEJAYLAA, [name]               ACCOUNT NO.:  [id number]   MEDICAL RECORD NO.:  [id number]          PATIENT TYPE:  INP   LOCATION:  A215                          FACILITY:  [facility]   PHYSICIAN:  Edward L. Luan Pulling, M.D.DATE OF BIRTH:  07/16/1920   DATE OF PROCEDURE:  DATE OF DISCHARGE:                                   PROGRESS NOTE   PROBLEMS:  1.  Stroke.  2.  Hypertension.   SUBJECTIVE:  [name] says she is about the same, except she thinks her  hand is a bit worse.  She also had a fall last night when she says that her  right leg collapsed under her.  This is the affected side.   PHYSICAL EXAMINATION:  VITAL SIGNS:  Her temperature is 98; pulse 76;  respirations 16; blood pressure 140/71.  CHEST:  Clear.  She does not have any carotid bruits.  She has not had her  carotid study as yet.   ASSESSMENT:  I think she is a little worse.  Her hand does not appear to be  as mobile as it was.  She thinks this is related to her IV, which is in her  right hand.  I do not think that is the case, but I will go ahead and  discontinue that.  PT, OT and speech consults are ordered and I am going to  go ahead and ask Dr. Merlene Laughter to see her as well.  I think she is probably  going to require short term skilled care facility placement because she  lives at home and I am concerned about the potential for fall, etc.   PLAN:  We will plan to continue with her other treatments and follow.  Her  blood pressure is better controlled.   ADDENDUM:  Her family, after I had seen her, came in and said that she was  taking her Valium from home.  I have Valium ordered here.  I have told her  not to take her home medication.     Edwa   ELH/MEDQ  D:  01/19/2004  T:  01/19/2004  Job:  [id number]

## 2010-06-04 NOTE — Group Therapy Note (Signed)
NAMEGAONOU, HELING               ACCOUNT NO.:  [id number]   MEDICAL RECORD NO.:  [id number]          PATIENT TYPE:  INP   LOCATION:  A215                          FACILITY:  [facility]   PHYSICIAN:  Edward L. Luan Pulling, M.D.DATE OF BIRTH:  02-May-1920   DATE OF PROCEDURE:  DATE OF DISCHARGE:                                   PROGRESS NOTE   PROBLEM:  Stroke.   SUBJECTIVE:  Mrs. Sylvan said she thinks her hand is a little worse today.  To my exam she does have a little bit less strength in her hand than she did  on the last exam.  She is otherwise about the same and has no other new  complaints.   VITAL SIGNS:  Her temperature is 97.5, pulse 82, respirations 20, blood  pressure 150/81.  It actually got up to 197/136.  Her speech is still not  clear.  CHEST:  Clear.  HEART:  Regular.  ABDOMEN:  Soft.  EXTREMITIES:  Showed no edema.   ASSESSMENT:  She has had a stroke.   PLAN:  To continue with her treatments and medications.  I have asked for  PT, speech and OT consultation.  She has a carotid study ordered.  She has  an echocardiogram ordered.     Edwa   ELH/MEDQ  D:  01/18/2004  T:  01/18/2004  Job:  [id number]

## 2010-06-04 NOTE — H&P (Signed)
NAMEALTAMEASE, CAMBRON               ACCOUNT NO.:  [id number]   MEDICAL RECORD NO.:  [id number]          PATIENT TYPE:  EMS   LOCATION:  ED                            FACILITY:  [facility]   PHYSICIAN:  Edward L. Luan Pulling, M.D.DATE OF BIRTH:  01/14/1921   DATE OF ADMISSION:  01/17/2004  DATE OF DISCHARGE:  LH                                HISTORY & PHYSICAL   REASON FOR ADMISSION:  Stroke.   HISTORY:  Ms. Katzen is an 77 year old who started having problems at about  5:30 this morning with weakness of her right side, and her friends stay that  her speech was very slurred.  She seemed to get a little better, but she was  finally prevailed upon to come to the hospital which she did.  When she got  to the hospital, she was noted to have right-sided weakness as well as  somewhat slurred speech.  She has a long known history of hypertension, but  has not ever had anything similar to this in the past.  She says she is fine  now, but her speech is clearly still not normal for her.  She has otherwise  been in pretty good health.   PAST MEDICAL HISTORY:  Positive for hypertension, anxiety for which she  takes Valium.  She does not remember what her antihypertensive is.   SOCIAL HISTORY:  She is a widow.  She lives alone.  She does not have any  history of alcohol or tobacco use.   FAMILY HISTORY:  Her family history is not positive for strokes as far as  she knows.   REVIEW OF SYSTEMS:  Except as mentioned, she has not had any seizures, no  nausea or vomiting.   PHYSICAL EXAMINATION:  VITAL SIGNS:  Blood pressure 170/70, pulse 80.  She  is afebrile.  Respirations are about 14.  HEENT:  She has some facial asymmetry.  Her mucous membranes are moist.  Her  tympanic membranes are intact.  Pupils are reactive to light and  accommodation.  Her nose and throat are clear.  CHEST:  Clear.  HEART:  Regular without gallop.  ABDOMEN:  Soft without masses.  EXTREMITIES:  Showed no edema.  NEUROLOGIC:   Central nervous system examination shows that she does have  definite right-sided weakness, and her speech is not normal.   LABORATORY DATA:  Prothrombin time is 12.6, INR is 0.9.  BMET shows her  sodium is 134, potassium 3.6, chloride 104, CO2 25, glucose 124, BUN 20,  creatinine of 1.2.  CBC shows her white count 6600, hemoglobin 12.8,  platelets 248.   ASSESSMENT:  She has had a stroke, still with some residual.   PLAN:  Go ahead and admit her, place her on ACE inhibitors and Plavix.  I am  going to give her high-dose Lipitor as well, and check carotids for  blockage.  Check and echocardiogram.     Edwa   ELH/MEDQ  D:  01/17/2004  T:  01/17/2004  Job:  [id number]

## 2010-06-04 NOTE — Group Therapy Note (Signed)
NAMEYENTY, COCKRAN               ACCOUNT NO.:  [id number]   MEDICAL RECORD NO.:  [id number]          PATIENT TYPE:  INP   LOCATION:  A215                          FACILITY:  [facility]   PHYSICIAN:  Edward L. Luan Pulling, M.D.DATE OF BIRTH:  1920/04/10   DATE OF PROCEDURE:  DATE OF DISCHARGE:                                   PROGRESS NOTE   PROBLEM:  Stroke.   SUBJECTIVE:  Mrs. Marelli is clearly having more trouble with her right  hand.  Her hand is pretty much flaccid, and she is not really able to do  anything with it.  She did work with PT yesterday and apparently did fairly  well.  I have discussed at length with the patient and her family yesterday  and today that she is not going to be able to be managed at home, that she  is going to have to have at least a short-term nursing home placement.  I  have explained to Mrs. Aure that it will take at least six months for her  to see her total improvement from her stroke.   Her physical exam shows that her temperature is 97.9, pulse is 80,  respirations 20, blood pressure 144/87.  Her chest is clear.  Heart is regular.  She does have some right hemiparesis but not as bad.  Her hand is pretty  much flaccid.  Leg is weak.   Plan is to go ahead with setting her up for discharge to a skilled care  facility.     Edwa   ELH/MEDQ  D:  01/20/2004  T:  01/20/2004  Job:  [id number]

## 2010-06-04 NOTE — Discharge Summary (Signed)
[name], [name]               ACCOUNT NO.:  [id number]   MEDICAL RECORD NO.:  [id number]          PATIENT TYPE:  INP   LOCATION:  A215                          FACILITY:  [facility]   PHYSICIAN:  Edward L. Luan Pulling, M.D.DATE OF BIRTH:  1920/07/24   DATE OF ADMISSION:  01/17/2004  DATE OF DISCHARGE:  [facility]                                 DISCHARGE SUMMARY   DISPOSITION:  Going to a skilled care facility.   FINAL DISCHARGE DIAGNOSES:  1.  Stroke with right hemiparesis.  2.  Hypertension.  3.  Anxiety.   HISTORY:  [name] is an 77 year old who had problems starting at about  5:30 in the morning with weakness of her left side and she had very slurred  speech.  She seemed to get a little better; she delayed coming to the  emergency room until about 8 hours had passed.  She has a history of  hypertension and anxiety.   PHYSICAL EXAMINATION:  VITAL SIGNS:  Her physical examination shows a blood  pressure of 170/70 initially, pulse 80, respirations 14.  NEUROLOGIC:  She  had some facial asymmetry.  Speech was clearly not normal, but not terribly  slurred.  She had decreased strength on her right side.  CHEST:  Chest was clear.   HOSPITAL COURSE:  She was admitted, started on ACE inhibitors, Plavix, high-  dose Lipitor, but did not show much improvement and in fact, her hand seemed  to get worse.  PT, speech and OT consultations were obtained.  I explained  to her family that she was not going to be able to be managed at home and  she is being transferred to a skilled care facility on:   DISCHARGE MEDICATIONS:  1.  Lipitor 80 mg daily.  2.  Plavix 75 mg daily.  3.  Altace 10 mg daily.  4.  Valium 5 mg p.o. t.i.d. p.r.n.  5.  Ambien 10 mg p.o. nightly p.r.n.   DISCHARGE PLANS:  She is to have speech, OT and PT at the skilled care  facility as well.  Eventual discharge plans would be to allow her to get  back home possibly with home health versus an assisted living facility.     Edwa  ELH/MEDQ  D:  01/20/2004  T:  01/20/2004  Job:  [id number]

## 2010-10-14 LAB — WOUND CULTURE: Culture: NO GROWTH

## 2010-10-14 LAB — BASIC METABOLIC PANEL
BUN: 16
BUN: 7
CO2: 35 — ABNORMAL HIGH
Calcium: 8.7
Chloride: 108
Chloride: 100
Chloride: 103
Creatinine, Ser: 0.9
Creatinine, Ser: 0.69
Creatinine, Ser: 0.82
GFR calc Af Amer: >60
GFR calc Af Amer: >60
GFR calc non Af Amer: >60
Glucose, Bld: 115 — ABNORMAL HIGH
Potassium: 3.6
Sodium: 138
Sodium: 140

## 2010-10-14 LAB — URINALYSIS, ROUTINE W REFLEX MICROSCOPIC
Bilirubin Urine: NEGATIVE
Bilirubin Urine: NEGATIVE
Glucose, UA: NEGATIVE
Ketones, ur: NEGATIVE
Nitrite: NEGATIVE
Specific Gravity, Urine: 1.02
Urobilinogen, UA: 0.2
Urobilinogen, UA: 1
pH: 6

## 2010-10-14 LAB — BLOOD GAS, ARTERIAL
Acid-base deficit: 1.2
Patient temperature: 37
TCO2: 21
pCO2 arterial: 39.3
pH, Arterial: 7.387

## 2010-10-14 LAB — CBC
HCT: 36.2
Hemoglobin: 10.7 — ABNORMAL LOW
Hemoglobin: 12.2
MCHC: 33.8
MCV: 89
MCV: 90.6
MCV: 91.1
Platelets: 218
Platelets: 152
RBC: 3.39 — ABNORMAL LOW
RBC: 3.46 — ABNORMAL LOW
RBC: 3.92
RDW: 12.8
WBC: 13 — ABNORMAL HIGH
WBC: 6.4

## 2010-10-14 LAB — DIFFERENTIAL
Basophils Absolute: 0
Basophils Absolute: 0
Basophils Relative: 0
Basophils Relative: 0
Basophils Relative: 0
Eosinophils Absolute: 0
Eosinophils Absolute: 0
Eosinophils Relative: 0
Eosinophils Relative: 0
Lymphocytes Relative: 15
Lymphocytes Relative: 25
Monocytes Absolute: 0.7
Monocytes Relative: 11
Monocytes Relative: 9
Neutro Abs: 4.6
Neutrophils Relative %: 85 — ABNORMAL HIGH
Neutrophils Relative %: 71
Neutrophils Relative %: 80 — ABNORMAL HIGH

## 2010-10-14 LAB — PROTIME-INR: Prothrombin Time: 13.2

## 2010-10-14 LAB — URINE CULTURE
Culture: NO GROWTH
Special Requests: NEGATIVE

## 2010-10-14 LAB — URINE MICROSCOPIC-ADD ON

## 2010-10-14 LAB — SEDIMENTATION RATE: Sed Rate: 10

## 2010-10-14 LAB — CULTURE, BLOOD (ROUTINE X 2): Report Status: 7072009

## 2010-10-14 LAB — CROSSMATCH

## 2010-10-14 LAB — ABO/RH: ABO/RH(D): O POS

## 2011-05-24 ENCOUNTER — Encounter (HOSPITAL_COMMUNITY): Payer: Self-pay

## 2011-05-24 ENCOUNTER — Emergency Department (HOSPITAL_COMMUNITY)
Admission: EM | Admit: 2011-05-24 | Discharge: 2011-05-24 | Disposition: A | Discharge status: Home / Self Care | Payer: Medicare Other | Attending: Emergency Medicine | Admitting: Emergency Medicine

## 2011-05-24 ENCOUNTER — Emergency Department (HOSPITAL_COMMUNITY): Payer: Medicare Other

## 2011-05-24 DIAGNOSIS — R6889 Other general symptoms and signs: Secondary | ICD-10-CM | POA: Diagnosis not present

## 2011-05-24 DIAGNOSIS — I10 Essential (primary) hypertension: Secondary | ICD-10-CM | POA: Diagnosis not present

## 2011-05-24 DIAGNOSIS — S99929A Unspecified injury of unspecified foot, initial encounter: Secondary | ICD-10-CM | POA: Diagnosis not present

## 2011-05-24 DIAGNOSIS — IMO0002 Reserved for concepts with insufficient information to code with codable children: Secondary | ICD-10-CM | POA: Diagnosis not present

## 2011-05-24 DIAGNOSIS — M25569 Pain in unspecified knee: Secondary | ICD-10-CM | POA: Insufficient documentation

## 2011-05-24 DIAGNOSIS — S8990XA Unspecified injury of unspecified lower leg, initial encounter: Secondary | ICD-10-CM | POA: Diagnosis not present

## 2011-05-24 DIAGNOSIS — S8391XA Sprain of unspecified site of right knee, initial encounter: Secondary | ICD-10-CM

## 2011-05-24 HISTORY — DX: Cerebral infarction, unspecified: I63.9

## 2011-05-24 HISTORY — DX: Essential (primary) hypertension: I10

## 2011-05-24 MED ORDER — OXYCODONE-ACETAMINOPHEN 5-325 MG PO TABS: 1.0000 | ORAL_TABLET | Freq: Three times a day (TID) | ORAL | Status: AC | PRN

## 2011-05-24 MED ORDER — OXYCODONE-ACETAMINOPHEN 5-325 MG PO TABS
1.0000 | ORAL_TABLET | Freq: Once | ORAL | Status: AC
  Administered 2011-05-24: 1 via ORAL
  Filled 2011-05-24: qty 1

## 2011-05-24 NOTE — ED Notes (Signed)
Pt states she twisted her right knee and fell about a week ago. States she thought it would get better but has not. Bruising noted from right knee down leg

## 2011-05-24 NOTE — ED Provider Notes (Signed)
History   This chart was scribed for Sharyon Cable, MD by Carolyne Littles. The patient was seen in room APA09/APA09. Patient's care was started at 1059.    CSN: [id number]  Arrival date & time 05/24/11  1059   First MD Initiated Contact with Patient 05/24/11 1114      Chief Complaint - knee pain   HPI [name] is a 78 y.o. female who presents to the Emergency Department complaining of waxing and waning moderate right knee pain onset 1 week ago after twisting her knee while falling and persistent since with associated weakness to RLE. Notes knee pain is worse with movement.  A bystander caught her prior to fall, but twisted knee. Denies fever, chills. chest pain, SOB, numbness, tingling, hip pain, back pain. Patient with h/o stroke, HTN and RLE surgery. Denies use of blood thinners.   Past Medical History  Diagnosis Date  . Hypertension   . Stroke     Past Surgical History  Procedure Date  . Joint replacement   . Hip adductor tenotomy     History reviewed. No pertinent family history.  History  Substance Use Topics  . Smoking status: Not on file  . Smokeless tobacco: Not on file  . Alcohol Use: No    OB History    Grav Para Term Preterm Abortions TAB SAB Ect Mult Living                  Review of Systems A complete 10 system review of systems was obtained and all systems are negative except as noted in the HPI and PMH.   Allergies  Sulfa antibiotics Codeine  Home Medications  No current outpatient prescriptions on file.  BP 141/43  Pulse 78  Temp(Src) 98.1 F (36.7 C) (Oral)  Resp 20  Ht 5\' 2"  (1.575 m)  Wt 120 lb (54.432 kg)  BMI 21.95 kg/m2  SpO2 96%  Physical Exam CONSTITUTIONAL: Well developed/well nourished HEAD AND FACE: Normocephalic/atraumatic EYES: EOMI/PERRL ENMT: Mucous membranes moist NECK: supple no meningeal signs SPINE:entire spine nontender CV: S1/S2 noted, no murmurs/rubs/gallops noted LUNGS: Lungs are clear to auscultation  bilaterally, no apparent distress ABDOMEN: soft, nontender, no rebound or guarding NEURO: Pt is awake/alert, moves all extremitiesx4 EXTREMITIES: pulses normal, full ROM, DP and PT pulses intact bilaterally, tenderness to palpation to right knee with no bruising or erythema, no edema noted, right ankle non tender but scattered bruising to right tibia, no open skin SKIN: warm, color normal PSYCH: no abnormalities of mood noted  ED Course  Procedures   DIAGNOSTIC STUDIES: Oxygen Saturation is 96% on room air, adequate by my interpretation.    COORDINATION OF CARE: 11:29AM-Patient informed of current plan for treatment and evaluation and agrees with plan at this time.   Pt now ambulatory at baseline with walker, pain improved, requesting pain meds She is awake/alert, stable for d/c Discussed appropriate use of pain meds  The patient appears reasonably screened and/or stabilized for discharge and I doubt any other medical condition or other [facility] requiring further screening, evaluation, or treatment in the ED at this time prior to discharge.   Labs Reviewed - No data to display Dg Ankle Complete Right  05/24/2011  *RADIOLOGY REPORT*  Clinical Data: Twisted right ankle.  RIGHT ANKLE - COMPLETE 3+ VIEW  Comparison: None  Findings: The ankle mortise is maintained.  No acute fracture. Moderate osteoporosis.  The visualized mid and hind foot bony structures are intact.  IMPRESSION: No acute fracture.  Original Report Authenticated By: P. Kalman Jewels, M.D.   Dg Knee Complete 4 Views Right  05/24/2011  *RADIOLOGY REPORT*  Clinical Data: Twisted leg 1 week ago  RIGHT KNEE - COMPLETE 4+ VIEW  Comparison: None.  Findings: Internal fixation of the distal right femur.  No evidence acute fracture or dislocation.  No joint effusion.  Osteopenia noted.  IMPRESSION:  1.  No fracture. 2.  Osteopenia  Original Report Authenticated By: Suzy Bouchard, M.D.        MDM  Nursing notes reviewed and  considered in documentation xrays reviewed and considered Previous records reviewed and considered       I personally performed the services described in this documentation, which was scribed in my presence. The recorded information has been reviewed and considered.      Sharyon Cable, MD 05/24/11 1400

## 2011-05-24 NOTE — Discharge Instructions (Signed)
Joint Sprain A sprain is a tear or stretch in the ligaments that hold a joint together. Severe sprains may need as long as 3-6 weeks of immobilization and/or exercises to heal completely. Sprained joints should be rested and protected. If not, they can become unstable and prone to re-injury. Proper treatment can reduce your pain, shorten the period of disability, and reduce the risk of repeated injuries. TREATMENT   Rest and elevate the injured joint to reduce pain and swelling.   Apply ice packs to the injury for 20-30 minutes every 2-3 hours for the next 2-3 days.   Keep the injury wrapped in a compression bandage or splint as long as the joint is painful or as instructed by your caregiver.   Do not use the injured joint until it is completely healed to prevent re-injury and chronic instability. Follow the instructions of your caregiver.   Long-term sprain management may require exercises and/or treatment by a physical therapist. Taping or special braces may help stabilize the joint until it is completely better.  SEEK MEDICAL CARE IF:   You develop increased pain or swelling of the joint.   You develop increasing redness and warmth of the joint.   You develop a fever.   It becomes stiff.   Your hand or foot gets cold or numb.  Document Released: 02/11/2004 Document Revised: 12/23/2010 Document Reviewed: 01/21/2008 [facility] Patient Information 2012 [place].

## 2011-06-22 DIAGNOSIS — I10 Essential (primary) hypertension: Secondary | ICD-10-CM | POA: Diagnosis not present

## 2011-06-22 DIAGNOSIS — M1990 Unspecified osteoarthritis, unspecified site: Secondary | ICD-10-CM | POA: Diagnosis not present

## 2011-06-22 DIAGNOSIS — E785 Hyperlipidemia, unspecified: Secondary | ICD-10-CM | POA: Diagnosis not present

## 2011-06-22 DIAGNOSIS — N318 Other neuromuscular dysfunction of bladder: Secondary | ICD-10-CM | POA: Diagnosis not present

## 2012-02-28 ENCOUNTER — Encounter (HOSPITAL_COMMUNITY): Payer: Self-pay | Admitting: Emergency Medicine

## 2012-02-28 ENCOUNTER — Inpatient Hospital Stay (HOSPITAL_COMMUNITY)
Admission: EM | Admit: 2012-02-28 | Discharge: 2012-03-05 | DRG: 690 | Disposition: A | Discharge status: Home Health | Payer: Medicare Other | Attending: Pulmonary Disease | Admitting: Pulmonary Disease

## 2012-02-28 ENCOUNTER — Emergency Department (HOSPITAL_COMMUNITY): Payer: Medicare Other

## 2012-02-28 DIAGNOSIS — Z8673 Personal history of transient ischemic attack (TIA), and cerebral infarction without residual deficits: Secondary | ICD-10-CM | POA: Diagnosis not present

## 2012-02-28 DIAGNOSIS — A498 Other bacterial infections of unspecified site: Secondary | ICD-10-CM | POA: Diagnosis present

## 2012-02-28 DIAGNOSIS — I10 Essential (primary) hypertension: Secondary | ICD-10-CM | POA: Diagnosis not present

## 2012-02-28 DIAGNOSIS — R410 Disorientation, unspecified: Secondary | ICD-10-CM | POA: Diagnosis present

## 2012-02-28 DIAGNOSIS — R6889 Other general symptoms and signs: Secondary | ICD-10-CM | POA: Diagnosis not present

## 2012-02-28 DIAGNOSIS — J4489 Other specified chronic obstructive pulmonary disease: Secondary | ICD-10-CM | POA: Diagnosis not present

## 2012-02-28 DIAGNOSIS — E876 Hypokalemia: Secondary | ICD-10-CM | POA: Diagnosis not present

## 2012-02-28 DIAGNOSIS — N318 Other neuromuscular dysfunction of bladder: Secondary | ICD-10-CM | POA: Diagnosis present

## 2012-02-28 DIAGNOSIS — R4182 Altered mental status, unspecified: Secondary | ICD-10-CM | POA: Diagnosis not present

## 2012-02-28 DIAGNOSIS — N390 Urinary tract infection, site not specified: Principal | ICD-10-CM | POA: Diagnosis present

## 2012-02-28 DIAGNOSIS — J449 Chronic obstructive pulmonary disease, unspecified: Secondary | ICD-10-CM | POA: Diagnosis present

## 2012-02-28 DIAGNOSIS — I517 Cardiomegaly: Secondary | ICD-10-CM | POA: Diagnosis not present

## 2012-02-28 DIAGNOSIS — F05 Delirium due to known physiological condition: Secondary | ICD-10-CM | POA: Diagnosis not present

## 2012-02-28 DIAGNOSIS — G4700 Insomnia, unspecified: Secondary | ICD-10-CM | POA: Diagnosis not present

## 2012-02-28 DIAGNOSIS — Z79899 Other long term (current) drug therapy: Secondary | ICD-10-CM | POA: Diagnosis not present

## 2012-02-28 DIAGNOSIS — F29 Unspecified psychosis not due to a substance or known physiological condition: Secondary | ICD-10-CM | POA: Diagnosis not present

## 2012-02-28 LAB — CBC WITH DIFFERENTIAL/PLATELET
Basophils Absolute: 0 10*3/uL (ref 0.0–0.1)
Basophils Relative: 0 % (ref 0–1)
Eosinophils Relative: 0 % (ref 0–5)
HCT: 45 % (ref 36.0–46.0)
MCHC: 34 g/dL (ref 30.0–36.0)
MCV: 90.4 fL (ref 78.0–100.0)
Monocytes Absolute: 0.8 10*3/uL (ref 0.1–1.0)
Neutro Abs: 8.6 10*3/uL — ABNORMAL HIGH (ref 1.7–7.7)
Platelets: 269 10*3/uL (ref 150–400)
RDW: 12.7 % (ref 11.5–15.5)
WBC: 10.9 10*3/uL — ABNORMAL HIGH (ref 4.0–10.5)

## 2012-02-28 LAB — COMPREHENSIVE METABOLIC PANEL
ALT: 12 U/L (ref 0–35)
AST: 24 U/L (ref 0–37)
Albumin: 5.1 g/dL (ref 3.5–5.2)
Calcium: 10.5 mg/dL (ref 8.4–10.5)
Creatinine, Ser: 0.77 mg/dL (ref 0.50–1.10)
Sodium: 139 mEq/L (ref 135–145)
Total Protein: 8.6 g/dL — ABNORMAL HIGH (ref 6.0–8.3)

## 2012-02-28 LAB — URINALYSIS, ROUTINE W REFLEX MICROSCOPIC
Glucose, UA: NEGATIVE mg/dL
Ketones, ur: NEGATIVE mg/dL
Leukocytes, UA: NEGATIVE
Protein, ur: 100 mg/dL — AB
Specific Gravity, Urine: 1.025 (ref 1.005–1.030)
Urobilinogen, UA: 0.2 mg/dL (ref 0.0–1.0)

## 2012-02-28 LAB — URINE MICROSCOPIC-ADD ON

## 2012-02-28 MED ORDER — AMLODIPINE BESYLATE 5 MG PO TABS
10.0000 mg | ORAL_TABLET | Freq: Every day | ORAL | Status: DC
  Administered 2012-02-29 – 2012-03-05 (×5): 10 mg via ORAL
  Filled 2012-02-28 (×6): qty 2

## 2012-02-28 MED ORDER — AMLODIPINE BESY-BENAZEPRIL HCL 10-20 MG PO CAPS: 1.0000 | ORAL_CAPSULE | Freq: Every day | ORAL | Status: DC

## 2012-02-28 MED ORDER — BENAZEPRIL HCL 10 MG PO TABS
20.0000 mg | ORAL_TABLET | Freq: Every day | ORAL | Status: DC
  Administered 2012-02-29 – 2012-03-05 (×5): 20 mg via ORAL
  Filled 2012-02-28: qty 1
  Filled 2012-02-28: qty 2
  Filled 2012-02-28: qty 1
  Filled 2012-02-28 (×3): qty 2
  Filled 2012-02-28: qty 1
  Filled 2012-02-28 (×2): qty 2

## 2012-02-28 MED ORDER — DIAZEPAM 5 MG PO TABS
5.0000 mg | ORAL_TABLET | Freq: Every day | ORAL | Status: DC
  Administered 2012-02-28 – 2012-03-04 (×6): 5 mg via ORAL
  Filled 2012-02-28 (×6): qty 1

## 2012-02-28 MED ORDER — CEFTRIAXONE SODIUM 1 G IJ SOLR: 1.0000 g | Freq: Once | INTRAMUSCULAR | Status: DC

## 2012-02-28 MED ORDER — OXYBUTYNIN CHLORIDE ER 5 MG PO TB24
10.0000 mg | ORAL_TABLET | Freq: Every day | ORAL | Status: DC
  Administered 2012-02-29 – 2012-03-05 (×5): 10 mg via ORAL
  Filled 2012-02-28 (×2): qty 2
  Filled 2012-02-28: qty 1
  Filled 2012-02-28 (×3): qty 2
  Filled 2012-02-28: qty 1
  Filled 2012-02-28: qty 2

## 2012-02-28 MED ORDER — DEXTROSE 5 % IV SOLN
1.0000 g | INTRAVENOUS | Status: DC
  Administered 2012-02-28 – 2012-03-04 (×6): 1 g via INTRAVENOUS
  Filled 2012-02-28 (×7): qty 10

## 2012-02-28 MED ORDER — ACETAMINOPHEN 500 MG PO TABS
500.0000 mg | ORAL_TABLET | ORAL | Status: DC | PRN
  Administered 2012-02-29 – 2012-03-02 (×2): 500 mg via ORAL
  Filled 2012-02-28 (×2): qty 1

## 2012-02-28 NOTE — ED Notes (Addendum)
Pt lives alone and cares for self. Pt walks with walker, but unsteady today without it. Pt family reports pt is confused and talking to people who aren't there. No history dementia/alzheimers. [id number]. Pt has strong urine odor.
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
## 2012-02-29 DIAGNOSIS — N390 Urinary tract infection, site not specified: Secondary | ICD-10-CM | POA: Diagnosis not present

## 2012-02-29 DIAGNOSIS — R4182 Altered mental status, unspecified: Secondary | ICD-10-CM | POA: Diagnosis not present

## 2012-02-29 MED ORDER — CIPROFLOXACIN IN D5W 400 MG/200ML IV SOLN
400.0000 mg | Freq: Two times a day (BID) | INTRAVENOUS | Status: DC
  Administered 2012-02-29 – 2012-03-02 (×5): 400 mg via INTRAVENOUS
  Filled 2012-02-29 (×5): qty 200

## 2012-02-29 MED ORDER — LORAZEPAM 2 MG/ML IJ SOLN
1.0000 mg | INTRAMUSCULAR | Status: DC | PRN
  Administered 2012-02-29 – 2012-03-03 (×4): 1 mg via INTRAVENOUS
  Filled 2012-02-29 (×4): qty 1

## 2012-02-29 MED ORDER — ENOXAPARIN SODIUM 40 MG/0.4ML ~~LOC~~ SOLN
40.0000 mg | SUBCUTANEOUS | Status: DC
  Administered 2012-02-29 – 2012-03-05 (×6): 40 mg via SUBCUTANEOUS
  Filled 2012-02-29 (×6): qty 0.4

## 2012-02-29 MED ORDER — SODIUM CHLORIDE 0.9 % IV SOLN
INTRAVENOUS | Status: DC
  Administered 2012-02-29 – 2012-03-02 (×5): via INTRAVENOUS

## 2012-02-29 NOTE — Progress Notes (Signed)
ANTICOAGULATION CONSULT NOTE - Initial Consult  Pharmacy Consult for Lovenox Indication: VTE prophylaxis  Allergies  Allergen Reactions  . Sulfa Antibiotics    Patient Measurements: Height: 5\' 2"  (157.5 cm) Weight: 121 lb (54.885 kg) IBW/kg (Calculated) : 50.1  Vital Signs: Temp: 98.6 F (37 C) (02/12 0547) Temp src: Oral (02/12 0547) BP: 181/102 mmHg (02/12 0547) Pulse Rate: 100 (02/12 0547)  Labs:  Recent Labs  02/28/12 1912  HGB 15.3*  HCT 45.0  PLT 269  CREATININE 0.77  TROPONINI <0.30   Estimated Creatinine Clearance: 37.7 ml/min (by C-G formula based on Cr of 0.77).  Medical History: Past Medical History  Diagnosis Date  . Hypertension   . Stroke    Medications:  Scheduled:  . amLODipine  10 mg Oral Daily   And  . benazepril  20 mg Oral Daily  . cefTRIAXone (ROCEPHIN)  IV  1 g Intravenous Q24H  . diazepam  5 mg Oral QHS  . enoxaparin (LOVENOX) injection  40 mg Subcutaneous Q24H  . oxybutynin  10 mg Oral Daily  . [DISCONTINUED] amLODipine-benazepril  1 capsule Oral Daily  . [COMPLETED] cefTRIAXone  1 g Intramuscular Once    Assessment: 79yo female with clcr > 30.  Estimated Creatinine Clearance: 37.7 ml/min (by C-G formula based on Cr of 0.77).  Goal of Therapy:  VTE prophylaxis Monitor platelets by anticoagulation protocol: Yes   Plan:  Lovenox 40mg  SQ q24hrs CBC per protocol  Hart Robinsons A 02/29/2012,8:00 AM
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
## 2012-03-01 DIAGNOSIS — N390 Urinary tract infection, site not specified: Principal | ICD-10-CM | POA: Diagnosis present

## 2012-03-01 DIAGNOSIS — I10 Essential (primary) hypertension: Secondary | ICD-10-CM | POA: Diagnosis present

## 2012-03-01 DIAGNOSIS — E876 Hypokalemia: Secondary | ICD-10-CM | POA: Diagnosis not present

## 2012-03-01 DIAGNOSIS — R410 Disorientation, unspecified: Secondary | ICD-10-CM | POA: Diagnosis present

## 2012-03-01 DIAGNOSIS — R4182 Altered mental status, unspecified: Secondary | ICD-10-CM | POA: Diagnosis not present

## 2012-03-01 LAB — CBC WITH DIFFERENTIAL/PLATELET
Basophils Absolute: 0 10*3/uL (ref 0.0–0.1)
Lymphocytes Relative: 22 % (ref 12–46)
Neutro Abs: 5 10*3/uL (ref 1.7–7.7)
Neutrophils Relative %: 64 % (ref 43–77)
Platelets: 213 10*3/uL (ref 150–400)
RDW: 12.5 % (ref 11.5–15.5)
WBC: 7.9 10*3/uL (ref 4.0–10.5)

## 2012-03-01 LAB — BASIC METABOLIC PANEL
Calcium: 8.5 mg/dL (ref 8.4–10.5)
Chloride: 103 mEq/L (ref 96–112)
Creatinine, Ser: 0.68 mg/dL (ref 0.50–1.10)
GFR calc Af Amer: 87 mL/min — ABNORMAL LOW (ref >90)

## 2012-03-01 MED ORDER — POTASSIUM CHLORIDE CRYS ER 20 MEQ PO TBCR
20.0000 meq | EXTENDED_RELEASE_TABLET | Freq: Two times a day (BID) | ORAL | Status: DC
  Administered 2012-03-01 – 2012-03-05 (×9): 20 meq via ORAL
  Filled 2012-03-01 (×9): qty 1

## 2012-03-01 NOTE — Progress Notes (Signed)
Subjective: She was admitted last night with delirium from UTI. She has other medical problems which have been stable. She remains very confused  Objective: Vital signs in last 24 hours: Temp:  [97.8 F (36.6 C)-98.2 F (36.8 C)] 97.8 F (36.6 C) (02/13 0500) Pulse Rate:  [80-99] 80 (02/13 0500) Resp:  [18-20] 18 (02/13 0500) BP: (106-189)/(60-94) 146/60 mmHg (02/13 0500) SpO2:  [93 %-99 %] 99 % (02/13 0500) Weight change:  Last BM Date: 02/28/12  Intake/Output from previous day: 02/12 0701 - 02/13 0700 In: 2923.3 [P.O.:240; I.V.:2183.3; IV Piggyback:500] Out: -   PHYSICAL EXAM General appearance: alert and Very confused Resp: clear to auscultation bilaterally Cardio: regular rate and rhythm, S1, S2 normal, no murmur, click, rub or gallop GI: soft, non-tender; bowel sounds normal; no masses,  no organomegaly Extremities: extremities normal, atraumatic, no cyanosis or edema  Lab Results:    Basic Metabolic Panel:  Recent Labs  02/28/12 1912 03/01/12 0518  NA 139 138  K 3.7 2.8*  CL 97 103  CO2 28 25  GLUCOSE 117* 105*  BUN 15 14  CREATININE 0.77 0.68  CALCIUM 10.5 8.5   Liver Function Tests:  Recent Labs  02/28/12 1912  AST 24  ALT 12  ALKPHOS 114  BILITOT 1.8*  PROT 8.6*  ALBUMIN 5.1   No results found for this basename: LIPASE, AMYLASE,  in the last 72 hours No results found for this basename: AMMONIA,  in the last 72 hours CBC:  Recent Labs  02/28/12 1912 03/01/12 0518  WBC 10.9* 7.9  NEUTROABS 8.6* 5.0  HGB 15.3* 13.4  HCT 45.0 40.0  MCV 90.4 90.9  PLT 269 213   Cardiac Enzymes:  Recent Labs  02/28/12 1912  TROPONINI <0.30   BNP: No results found for this basename: PROBNP,  in the last 72 hours D-Dimer: No results found for this basename: DDIMER,  in the last 72 hours CBG: No results found for this basename: GLUCAP,  in the last 72 hours Hemoglobin A1C: No results found for this basename: HGBA1C,  in the last 72 hours Fasting  Lipid Panel: No results found for this basename: CHOL, HDL, LDLCALC, TRIG, CHOLHDL, LDLDIRECT,  in the last 72 hours Thyroid Function Tests: No results found for this basename: TSH, T4TOTAL, FREET4, T3FREE, THYROIDAB,  in the last 72 hours Anemia Panel: No results found for this basename: VITAMINB12, FOLATE, FERRITIN, TIBC, IRON, RETICCTPCT,  in the last 72 hours Coagulation: No results found for this basename: LABPROT, INR,  in the last 72 hours Urine Drug Screen: Drugs of Abuse  No results found for this basename: labopia, cocainscrnur, labbenz, amphetmu, thcu, labbarb    Alcohol Level: No results found for this basename: ETH,  in the last 72 hours Urinalysis:  Recent Labs  02/28/12 1830  COLORURINE YELLOW  LABSPEC 1.025  PHURINE 6.5  GLUCOSEU NEGATIVE  HGBUR MODERATE*  BILIRUBINUR NEGATIVE  KETONESUR NEGATIVE  PROTEINUR 100*  UROBILINOGEN 0.2  NITRITE NEGATIVE  LEUKOCYTESUR NEGATIVE   Misc. Labs:  ABGS No results found for this basename: PHART, PCO2, PO2ART, TCO2, HCO3,  in the last 72 hours CULTURES Recent Results (from the past 240 hour(s))  URINE CULTURE     Status: None   Collection Time    02/28/12  6:30 PM      Result Value Range Status   Specimen Description URINE, CATHETERIZED   Final   Special Requests NONE   Final   Culture  Setup Time 02/29/2012 01:43  Final   Colony Count PENDING   Incomplete   Culture Culture reincubated for better growth   Final   Report Status PENDING   Incomplete   Studies/Results: Dg Chest 2 View  02/28/2012  *RADIOLOGY REPORT*  Clinical Data:  Altered mental status.  No chest complaints.  CHEST - 2 VIEW  Comparison: 04/09/2010.  Findings: Cardiomegaly.  Calcified tortuous aorta.  Clear lung fields.   Osteopenia.  Similar appearance to priors.  IMPRESSION: No active cardiopulmonary disease.   Original Report Authenticated By: Rolla Flatten, M.D.     Medications:  Prior to Admission:  Prescriptions prior to admission   Medication Sig Dispense Refill  . acetaminophen (TYLENOL) 500 MG tablet Take 500 mg by mouth every 6 (six) hours as needed for pain.      Marland Kitchen amLODipine-benazepril (LOTREL) 10-20 MG per capsule Take 1 capsule by mouth daily.      . diazepam (VALIUM) 5 MG tablet Take 5 mg by mouth at bedtime.      Marland Kitchen oxybutynin (DITROPAN-XL) 10 MG 24 hr tablet Take 10 mg by mouth daily.       Scheduled: . amLODipine  10 mg Oral Daily   And  . benazepril  20 mg Oral Daily  . cefTRIAXone (ROCEPHIN)  IV  1 g Intravenous Q24H  . ciprofloxacin  400 mg Intravenous Q12H  . diazepam  5 mg Oral QHS  . enoxaparin (LOVENOX) injection  40 mg Subcutaneous Q24H  . oxybutynin  10 mg Oral Daily   Continuous: . sodium chloride 100 mL/hr at 03/01/12 0546   [id number], LORazepam  Assesment: She has delirium from a UTI. She has a history of hypertension and of overactive bladder she has had COPD. Active Problems:   * No active hospital problems. *    Plan: No change in treatments right now. I did add something to help with agitation. She will have repeat laboratory work in the morning.    LOS: 2 days   Areeb Corron L 03/01/2012, 10:36 AM

## 2012-03-01 NOTE — Progress Notes (Signed)
[place] for Lovenox Indication: VTE prophylaxis  Allergies  Allergen Reactions  . Sulfa Antibiotics    Patient Measurements: Height: 5\' 2"  (157.5 cm) Weight: 121 lb (54.885 kg) IBW/kg (Calculated) : 50.1  Vital Signs: Temp: 97.8 F (36.6 C) (02/13 0500) Temp src: Oral (02/13 0500) BP: 146/60 mmHg (02/13 0500) Pulse Rate: 80 (02/13 0500)  Labs:  Recent Labs  02/28/12 1912 03/01/12 0518  HGB 15.3* 13.4  HCT 45.0 40.0  PLT 269 213  CREATININE 0.77 0.68  TROPONINI <0.30  --    Estimated Creatinine Clearance: 37.7 ml/min (by C-G formula based on Cr of 0.68).  Medical History: Past Medical History  Diagnosis Date  . Hypertension   . Stroke    Medications:  Scheduled:  . amLODipine  10 mg Oral Daily   And  . benazepril  20 mg Oral Daily  . cefTRIAXone (ROCEPHIN)  IV  1 g Intravenous Q24H  . ciprofloxacin  400 mg Intravenous Q12H  . diazepam  5 mg Oral QHS  . enoxaparin (LOVENOX) injection  40 mg Subcutaneous Q24H  . oxybutynin  10 mg Oral Daily    Assessment: 79yo female with clcr > 30.  Estimated Creatinine Clearance: 37.7 ml/min (by C-G formula based on Cr of 0.68).  Labs OK.  Goal of Therapy:  VTE prophylaxis Monitor platelets by anticoagulation protocol: Yes   Plan:  Lovenox 40mg  SQ q24hrs Pharmacy will sign off  Hart Robinsons A 03/01/2012,7:45 AM

## 2012-03-01 NOTE — Progress Notes (Signed)
Subjective: She is much improved. She is less confused. She has no new complaints.  Objective: Vital signs in last 24 hours: Temp:  [97.8 F (36.6 C)-98.2 F (36.8 C)] 97.8 F (36.6 C) (02/13 0500) Pulse Rate:  [80-99] 80 (02/13 0500) Resp:  [18-20] 18 (02/13 0500) BP: (106-189)/(60-94) 123/74 mmHg (02/13 1048) SpO2:  [93 %-99 %] 99 % (02/13 0500) Weight change:  Last BM Date: 02/28/12  Intake/Output from previous day: 02/12 0701 - 02/13 0700 In: 2923.3 [P.O.:240; I.V.:2183.3; IV Piggyback:500] Out: -   PHYSICAL EXAM General appearance: alert, cooperative and no distress Resp: clear to auscultation bilaterally Cardio: regular rate and rhythm, S1, S2 normal, no murmur, click, rub or gallop GI: soft, non-tender; bowel sounds normal; no masses,  no organomegaly Extremities: extremities normal, atraumatic, no cyanosis or edema  Lab Results:    Basic Metabolic Panel:  Recent Labs  02/28/12 1912 03/01/12 0518  NA 139 138  K 3.7 2.8*  CL 97 103  CO2 28 25  GLUCOSE 117* 105*  BUN 15 14  CREATININE 0.77 0.68  CALCIUM 10.5 8.5   Liver Function Tests:  Recent Labs  02/28/12 1912  AST 24  ALT 12  ALKPHOS 114  BILITOT 1.8*  PROT 8.6*  ALBUMIN 5.1   No results found for this basename: LIPASE, AMYLASE,  in the last 72 hours No results found for this basename: AMMONIA,  in the last 72 hours CBC:  Recent Labs  02/28/12 1912 03/01/12 0518  WBC 10.9* 7.9  NEUTROABS 8.6* 5.0  HGB 15.3* 13.4  HCT 45.0 40.0  MCV 90.4 90.9  PLT 269 213   Cardiac Enzymes:  Recent Labs  02/28/12 1912  TROPONINI <0.30   BNP: No results found for this basename: PROBNP,  in the last 72 hours D-Dimer: No results found for this basename: DDIMER,  in the last 72 hours CBG: No results found for this basename: GLUCAP,  in the last 72 hours Hemoglobin A1C: No results found for this basename: HGBA1C,  in the last 72 hours Fasting Lipid Panel: No results found for this basename:  CHOL, HDL, LDLCALC, TRIG, CHOLHDL, LDLDIRECT,  in the last 72 hours Thyroid Function Tests: No results found for this basename: TSH, T4TOTAL, FREET4, T3FREE, THYROIDAB,  in the last 72 hours Anemia Panel: No results found for this basename: VITAMINB12, FOLATE, FERRITIN, TIBC, IRON, RETICCTPCT,  in the last 72 hours Coagulation: No results found for this basename: LABPROT, INR,  in the last 72 hours Urine Drug Screen: Drugs of Abuse  No results found for this basename: labopia, cocainscrnur, labbenz, amphetmu, thcu, labbarb    Alcohol Level: No results found for this basename: ETH,  in the last 72 hours Urinalysis:  Recent Labs  02/28/12 1830  COLORURINE YELLOW  LABSPEC 1.025  PHURINE 6.5  GLUCOSEU NEGATIVE  HGBUR MODERATE*  BILIRUBINUR NEGATIVE  KETONESUR NEGATIVE  PROTEINUR 100*  UROBILINOGEN 0.2  NITRITE NEGATIVE  LEUKOCYTESUR NEGATIVE   Misc. Labs:  ABGS No results found for this basename: PHART, PCO2, PO2ART, TCO2, HCO3,  in the last 72 hours CULTURES Recent Results (from the past 240 hour(s))  URINE CULTURE     Status: None   Collection Time    02/28/12  6:30 PM      Result Value Range Status   Specimen Description URINE, CATHETERIZED   Final   Special Requests NONE   Final   Culture  Setup Time 02/29/2012 01:43   Final   Colony Count PENDING  Incomplete   Culture Culture reincubated for better growth   Final   Report Status PENDING   Incomplete   Studies/Results: Dg Chest 2 View  02/28/2012  *RADIOLOGY REPORT*  Clinical Data:  Altered mental status.  No chest complaints.  CHEST - 2 VIEW  Comparison: 04/09/2010.  Findings: Cardiomegaly.  Calcified tortuous aorta.  Clear lung fields.   Osteopenia.  Similar appearance to priors.  IMPRESSION: No active cardiopulmonary disease.   Original Report Authenticated By: Rolla Flatten, M.D.     Medications:  Prior to Admission:  Prescriptions prior to admission  Medication Sig Dispense Refill  . acetaminophen (TYLENOL)  500 MG tablet Take 500 mg by mouth every 6 (six) hours as needed for pain.      Marland Kitchen amLODipine-benazepril (LOTREL) 10-20 MG per capsule Take 1 capsule by mouth daily.      . diazepam (VALIUM) 5 MG tablet Take 5 mg by mouth at bedtime.      Marland Kitchen oxybutynin (DITROPAN-XL) 10 MG 24 hr tablet Take 10 mg by mouth daily.       Scheduled: . amLODipine  10 mg Oral Daily   And  . benazepril  20 mg Oral Daily  . cefTRIAXone (ROCEPHIN)  IV  1 g Intravenous Q24H  . ciprofloxacin  400 mg Intravenous Q12H  . diazepam  5 mg Oral QHS  . enoxaparin (LOVENOX) injection  40 mg Subcutaneous Q24H  . oxybutynin  10 mg Oral Daily  . potassium chloride  20 mEq Oral BID   Continuous: . sodium chloride 100 mL/hr at 03/01/12 0546   [id number], LORazepam  Assesment: She has UTI and has acute delirium from that. Her delirium is much better. She is hypokalemic this morning and that will need to be replaced Principal Problem:   Urinary tract infection, site not specified Active Problems:   Acute delirium   Essential hypertension, benign    Plan: Continue IV antibiotics and oral potassium continue all the other treatments and she has Ativan if needed    LOS: 2 days   Jakeisha Stricker L 03/01/2012, 11:02 AM

## 2012-03-02 LAB — CBC
HCT: 37.6 % (ref 36.0–46.0)
Hemoglobin: 12.6 g/dL (ref 12.0–15.0)
RDW: 12.6 % (ref 11.5–15.5)
WBC: 6.5 10*3/uL (ref 4.0–10.5)

## 2012-03-02 LAB — URINE CULTURE: Colony Count: >=100000

## 2012-03-02 MED ORDER — CIPROFLOXACIN HCL 250 MG PO TABS
500.0000 mg | ORAL_TABLET | Freq: Two times a day (BID) | ORAL | Status: DC
  Administered 2012-03-02 – 2012-03-05 (×5): 500 mg via ORAL
  Filled 2012-03-02: qty 1
  Filled 2012-03-02: qty 2
  Filled 2012-03-02: qty 1
  Filled 2012-03-02 (×3): qty 2
  Filled 2012-03-02: qty 1

## 2012-03-02 NOTE — Progress Notes (Signed)
PHARMACIST - PHYSICIAN COMMUNICATION DR:   Luan Pulling CONCERNING: Antibiotic IV to Oral Route Change Policy  RECOMMENDATION: This patient is receiving Cipro by the intravenous route.  Based on criteria approved by the Pharmacy and Therapeutics Committee, the antibiotic(s) is/are being converted to the equivalent oral dose form(s).   DESCRIPTION: These criteria include:  Patient being treated for a respiratory tract infection, urinary tract infection, or cellulitis  The patient is not neutropenic and does not exhibit a GI malabsorption state  The patient is eating (either orally or via tube) and/or has been taking other orally administered medications for a least 24 hours  The patient is improving clinically and has a Tmax < 100.5  If you have questions about this conversion, please contact the Pharmacy Department  [x]   [phone] )  [facility] []   [phone] )  [facility]  []   [phone] )  [facility] []   [phone] )  [facility]   S. [place] Crane, PharmD

## 2012-03-02 NOTE — Care Management Note (Signed)
    Page 1 of 2   03/05/2012     11:27:45 AM   CARE MANAGEMENT NOTE 03/05/2012  Patient:  [name], [name]   Account Number:  [id number]  Date Initiated:  03/02/2012  Documentation initiated by:  Vladimir Creeks  Subjective/Objective Assessment:   Pt admitted with UTI, and AMS, but is back to normal now. She lives alone, and is comepletely independent at home. She has family and friends who assist if she really needs help, but she refuses to let them do anything, unless she is sick!     Action/Plan:   pt will be returning home at D/C. She has a rolling walker, but is it broken and difficult to use, and she could use another one. It is 77 yrs old. Will ask for order for new walker.   Anticipated DC Date:  03/05/2012   Anticipated DC Plan:  [place]  CM consult      PAC Choice  [place]   Choice offered to / List presented to:     DME arranged  [place]      DME agency  [place] arranged  HH-1 RN  [place].   Status of service:  In process, will continue to follow Medicare Important Message given?  YES (If response is "NO", the following Medicare IM given date fields will be blank) Date Medicare IM given:  03/02/2012 Date Additional Medicare IM given:    Discharge Disposition:    Per UR Regulation:    If discussed at Long Length of Stay Meetings, dates discussed:    Comments:  03/05/12 [place], RN BSN CM Pt discharged home today with [facility] RN and PT. Romualdo Bolk of [facility] is aware and will collet the pts information from the chart. Pt also wants RW with [facility]. Referral sent to [facility] and the walker will be dropped shipped to pts home. Pt and pts nurse aware of discharge arrangements.  03/02/12 [place] Pt would benefit from [facility] RN to visit short term. Refuses HH PT. States she has had it before,  and gets around fine with her walker. Has used [facility], and really liked them and would like them again. Daughter is visiting  in the room and agrees with this. Would like walker from there also. Referrals made to [facility], [facility], and message left for MD

## 2012-03-02 NOTE — Progress Notes (Signed)
PT Cancellation Note  Patient Details Name: [name] MRN: [id number] DOB: 25-Jul-1920   Cancelled Treatment:    Reason Eval/Treat Not Completed:  (pt refused)..the patient adamantly refused to work with me in any way, shape, or form.  She became a bit agitated as I tried to gently persuade her to just "take a little walk with me".  She repeatedly stated that she had no intention of walking with me or doing any PT.  Her daughter was present and was also unable to persuade her to cooperate.  We will d/c PT consult.   Demetrios Isaacs L 03/02/2012, 1:12 PM

## 2012-03-02 NOTE — Progress Notes (Addendum)
Notified Dr. Cindie Laroche that pt is more confused this afternoon than this am. Order for BMET in the a.m.; however, there is already a BMET scheduled for in the am.. Also, notified him of pts urine culture positive for E-coli, pt is on Cipro, no new orders at this time. [name]

## 2012-03-02 NOTE — Progress Notes (Signed)
Subjective: She is still mildly confused. She is very weak  Objective: Vital signs in last 24 hours: Temp:  [98 F (36.7 C)-98.8 F (37.1 C)] 98.8 F (37.1 C) (02/14 0700) Pulse Rate:  [73-84] 84 (02/14 0900) Resp:  [18] 18 (02/14 0700) BP: (131-144)/(46-79) 131/46 mmHg (02/14 0900) SpO2:  [93 %-94 %] 93 % (02/14 0700) Weight change:  Last BM Date:  (unknown)  Intake/Output from previous day: 02/13 0701 - 02/14 0700 In: 2725 [I.V.:2275; IV Piggyback:450] Out: -   PHYSICAL EXAM General appearance: alert, cooperative and moderate distress Resp: clear to auscultation bilaterally Cardio: regular rate and rhythm, S1, S2 normal, no murmur, click, rub or gallop GI: soft, non-tender; bowel sounds normal; no masses,  no organomegaly Extremities: extremities normal, atraumatic, no cyanosis or edema  Lab Results:    Basic Metabolic Panel:  Recent Labs  02/28/12 1912 03/01/12 0518  NA 139 138  K 3.7 2.8*  CL 97 103  CO2 28 25  GLUCOSE 117* 105*  BUN 15 14  CREATININE 0.77 0.68  CALCIUM 10.5 8.5   Liver Function Tests:  Recent Labs  02/28/12 1912  AST 24  ALT 12  ALKPHOS 114  BILITOT 1.8*  PROT 8.6*  ALBUMIN 5.1   No results found for this basename: LIPASE, AMYLASE,  in the last 72 hours No results found for this basename: AMMONIA,  in the last 72 hours CBC:  Recent Labs  02/28/12 1912 03/01/12 0518 03/02/12 0442  WBC 10.9* 7.9 6.5  NEUTROABS 8.6* 5.0  --   HGB 15.3* 13.4 12.6  HCT 45.0 40.0 37.6  MCV 90.4 90.9 91.7  PLT 269 213 194   Cardiac Enzymes:  Recent Labs  02/28/12 1912  TROPONINI <0.30   BNP: No results found for this basename: PROBNP,  in the last 72 hours D-Dimer: No results found for this basename: DDIMER,  in the last 72 hours CBG: No results found for this basename: GLUCAP,  in the last 72 hours Hemoglobin A1C: No results found for this basename: HGBA1C,  in the last 72 hours Fasting Lipid Panel: No results found for this  basename: CHOL, HDL, LDLCALC, TRIG, CHOLHDL, LDLDIRECT,  in the last 72 hours Thyroid Function Tests: No results found for this basename: TSH, T4TOTAL, FREET4, T3FREE, THYROIDAB,  in the last 72 hours Anemia Panel: No results found for this basename: VITAMINB12, FOLATE, FERRITIN, TIBC, IRON, RETICCTPCT,  in the last 72 hours Coagulation: No results found for this basename: LABPROT, INR,  in the last 72 hours Urine Drug Screen: Drugs of Abuse  No results found for this basename: labopia, cocainscrnur, labbenz, amphetmu, thcu, labbarb    Alcohol Level: No results found for this basename: ETH,  in the last 72 hours Urinalysis:  Recent Labs  02/28/12 1830  COLORURINE YELLOW  LABSPEC 1.025  PHURINE 6.5  GLUCOSEU NEGATIVE  HGBUR MODERATE*  BILIRUBINUR NEGATIVE  KETONESUR NEGATIVE  PROTEINUR 100*  UROBILINOGEN 0.2  NITRITE NEGATIVE  LEUKOCYTESUR NEGATIVE   Misc. Labs:  ABGS No results found for this basename: PHART, PCO2, PO2ART, TCO2, HCO3,  in the last 72 hours CULTURES Recent Results (from the past 240 hour(s))  URINE CULTURE     Status: None   Collection Time    02/28/12  6:30 PM      Result Value Range Status   Specimen Description URINE, CATHETERIZED   Final   Special Requests NONE   Final   Culture  Setup Time 02/29/2012 01:43   Final  Colony Count PENDING   Incomplete   Culture Culture reincubated for better growth   Final   Report Status PENDING   Incomplete   Studies/Results: No results found.  Medications:  Prior to Admission:  Prescriptions prior to admission  Medication Sig Dispense Refill  . acetaminophen (TYLENOL) 500 MG tablet Take 500 mg by mouth every 6 (six) hours as needed for pain.      Marland Kitchen amLODipine-benazepril (LOTREL) 10-20 MG per capsule Take 1 capsule by mouth daily.      . diazepam (VALIUM) 5 MG tablet Take 5 mg by mouth at bedtime.      Marland Kitchen oxybutynin (DITROPAN-XL) 10 MG 24 hr tablet Take 10 mg by mouth daily.       Scheduled: . amLODipine   10 mg Oral Daily   And  . benazepril  20 mg Oral Daily  . cefTRIAXone (ROCEPHIN)  IV  1 g Intravenous Q24H  . ciprofloxacin  500 mg Oral BID  . diazepam  5 mg Oral QHS  . enoxaparin (LOVENOX) injection  40 mg Subcutaneous Q24H  . oxybutynin  10 mg Oral Daily  . potassium chloride  20 mEq Oral BID   Continuous: . sodium chloride 100 mL/hr at 03/02/12 0535   [id number], LORazepam  Assesment:she was admited with UTI and delirium. She is better her but still mildly confused her potassium has been low  Principal Problem:   Urinary tract infection, site not specified Active Problems:   Acute delirium   Essential hypertension, benign   Hypokalemia    Plan:she will continue current medications. I will reduce IV fluids. Recheck potassium tomorrow    LOS: 3 days   Hudson Lehmkuhl L 03/02/2012, 11:53 AM

## 2012-03-03 DIAGNOSIS — R4182 Altered mental status, unspecified: Secondary | ICD-10-CM | POA: Diagnosis not present

## 2012-03-03 DIAGNOSIS — N390 Urinary tract infection, site not specified: Secondary | ICD-10-CM | POA: Diagnosis not present

## 2012-03-03 LAB — BASIC METABOLIC PANEL
BUN: 9 mg/dL (ref 6–23)
Chloride: 105 mEq/L (ref 96–112)
GFR calc Af Amer: >90 mL/min (ref >90)
Glucose, Bld: 110 mg/dL — ABNORMAL HIGH (ref 70–99)
Potassium: 3.1 mEq/L — ABNORMAL LOW (ref 3.5–5.1)

## 2012-03-03 MED ORDER — LORAZEPAM 2 MG/ML IJ SOLN
1.0000 mg | Freq: Once | INTRAMUSCULAR | Status: AC
  Administered 2012-03-03: 1 mg via INTRAMUSCULAR
  Filled 2012-03-03: qty 1
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
## 2012-03-03 NOTE — Progress Notes (Addendum)
Pt continues to be very confused today, she was combative at times. She took some medications crushed in applesauce, but refused Diflucan, as it can't be crushed.  After resting, she was more pleasantly confused this evening, however, she is still hallucinating and hearing things. [name]

## 2012-03-04 ENCOUNTER — Inpatient Hospital Stay (HOSPITAL_COMMUNITY): Payer: Medicare Other

## 2012-03-04 DIAGNOSIS — J449 Chronic obstructive pulmonary disease, unspecified: Secondary | ICD-10-CM | POA: Diagnosis not present

## 2012-03-04 DIAGNOSIS — F29 Unspecified psychosis not due to a substance or known physiological condition: Secondary | ICD-10-CM | POA: Diagnosis not present

## 2012-03-04 DIAGNOSIS — R4182 Altered mental status, unspecified: Secondary | ICD-10-CM | POA: Diagnosis not present

## 2012-03-04 DIAGNOSIS — N390 Urinary tract infection, site not specified: Secondary | ICD-10-CM | POA: Diagnosis not present

## 2012-03-04 DIAGNOSIS — I10 Essential (primary) hypertension: Secondary | ICD-10-CM | POA: Diagnosis not present

## 2012-03-04 DIAGNOSIS — G4700 Insomnia, unspecified: Secondary | ICD-10-CM | POA: Diagnosis not present

## 2012-03-04 LAB — VITAMIN B12: Vitamin B-12: 158 pg/mL — ABNORMAL LOW (ref 211–911)

## 2012-03-04 LAB — BASIC METABOLIC PANEL
CO2: 28 mEq/L (ref 19–32)
Chloride: 101 mEq/L (ref 96–112)
Creatinine, Ser: 0.65 mg/dL (ref 0.50–1.10)
Potassium: 3.7 mEq/L (ref 3.5–5.1)

## 2012-03-04 LAB — DIFFERENTIAL
Basophils Relative: 0 % (ref 0–1)
Lymphocytes Relative: 16 % (ref 12–46)
Lymphs Abs: 1.2 10*3/uL (ref 0.7–4.0)
Monocytes Absolute: 0.7 10*3/uL (ref 0.1–1.0)
Monocytes Relative: 9 % (ref 3–12)
Neutro Abs: 5.6 10*3/uL (ref 1.7–7.7)

## 2012-03-04 LAB — FOLATE: Folate: 15.6 ng/mL

## 2012-03-04 LAB — MAGNESIUM: Magnesium: 1.8 mg/dL (ref 1.5–2.5)

## 2012-03-04 LAB — CBC
HCT: 41.4 % (ref 36.0–46.0)
Hemoglobin: 14 g/dL (ref 12.0–15.0)
MCHC: 33.8 g/dL (ref 30.0–36.0)
RBC: 4.56 MIL/uL (ref 3.87–5.11)

## 2012-03-04 MED ORDER — DEXTROSE 5 % IV SOLN
INTRAVENOUS | Status: AC
  Filled 2012-03-04: qty 10

## 2012-03-04 NOTE — Progress Notes (Signed)
NAMEJUDA, DEFREITAS NO.:  [id number]  MEDICAL RECORD NO.:  [id number]  LOCATION:  A341                          FACILITY:  [facility]  PHYSICIAN:  Unk Lightning, MDDATE OF BIRTH:  Nov 22, 1920  DATE OF PROCEDURE: DATE OF DISCHARGE:                                PROGRESS NOTE   An 79 year old white lady, Dr. Luan Pulling with documented UTI and possible altered mental status/delirium.  Blood pressure is 144/68, temperature 98, pulse is 96 and regular, respiratory rate is 20, O2 sat is 96%.  The patient verbalizes where she was uncomfortable today, recognizes caregiver by person, not by the name, more lucid than yesterday. Potassium was rectified from 3.1-3.7 today.  Hemoglobin was 12.6 on February 14,2014, currently on Rocephin and Cipro IV.  PHYSICAL EXAMINATION:  LUNGS:  Diminished breath sounds at the bases. No rales, wheeze, or rhonchi appreciable. HEART:  Regular rhythm.  No S3, S4.  No heaves, thrills, or rubs. EXTREMITIES:  No clubbing, cyanosis, or edema.  The patient is on Valium 5 at bedtime as well as Ditropan 10 mg daily, the only sedating substances.  The plan right now is to get CBC today, Bmet  in the a.m. to check potassium.  We will check magnesium.  We will do dementia workup and obtain CT of the head to rule out any significant infarct, which may account for altered mental state.     Unk Lightning, MD     RMD/MEDQ  D:  03/04/2012  T:  03/04/2012  Job:  [id number]

## 2012-03-04 NOTE — Progress Notes (Signed)
Subjective: She is more confused. She is on appropriate antibiotics based on culture results.  Objective: Vital signs in last 24 hours: Temp:  [97.6 F (36.4 C)-99.9 F (37.7 C)] 97.6 F (36.4 C) (02/16 1358) Pulse Rate:  [87-96] 87 (02/16 1358) Resp:  [16-24] 16 (02/16 1358) BP: (115-154)/(50-80) 115/71 mmHg (02/16 1358) SpO2:  [92 %-97 %] 92 % (02/16 1358) Weight change:  Last BM Date:  (unknown)  Intake/Output from previous day: 02/15 0701 - 02/16 0700 In: 786.3 [I.V.:786.3] Out: -   PHYSICAL EXAM General appearance: alert, moderate distress and confused Resp: clear to auscultation bilaterally Cardio: regular rate and rhythm, S1, S2 normal, no murmur, click, rub or gallop GI: soft, non-tender; bowel sounds normal; no masses,  no organomegaly Extremities: extremities normal, atraumatic, no cyanosis or edema  Lab Results:    Basic Metabolic Panel:  Recent Labs  03/03/12 0808 03/04/12 0610 03/04/12 1006  NA 141 138  --   K 3.1* 3.7  --   CL 105 101  --   CO2 25 28  --   GLUCOSE 110* 106*  --   BUN 9 8  --   CREATININE 0.61 0.65  --   CALCIUM 8.9 9.4  --   MG  --   --  1.8   Liver Function Tests: No results found for this basename: AST, ALT, ALKPHOS, BILITOT, PROT, ALBUMIN,  in the last 72 hours No results found for this basename: LIPASE, AMYLASE,  in the last 72 hours No results found for this basename: AMMONIA,  in the last 72 hours CBC:  Recent Labs  03/02/12 0442 03/04/12 1006  WBC 6.5 7.8  NEUTROABS  --  5.6  HGB 12.6 14.0  HCT 37.6 41.4  MCV 91.7 90.8  PLT 194 251   Cardiac Enzymes: No results found for this basename: CKTOTAL, CKMB, CKMBINDEX, TROPONINI,  in the last 72 hours BNP: No results found for this basename: PROBNP,  in the last 72 hours D-Dimer: No results found for this basename: DDIMER,  in the last 72 hours CBG: No results found for this basename: GLUCAP,  in the last 72 hours Hemoglobin A1C: No results found for this  basename: HGBA1C,  in the last 72 hours Fasting Lipid Panel: No results found for this basename: CHOL, HDL, LDLCALC, TRIG, CHOLHDL, LDLDIRECT,  in the last 72 hours Thyroid Function Tests: No results found for this basename: TSH, T4TOTAL, FREET4, T3FREE, THYROIDAB,  in the last 72 hours Anemia Panel: No results found for this basename: VITAMINB12, FOLATE, FERRITIN, TIBC, IRON, RETICCTPCT,  in the last 72 hours Coagulation: No results found for this basename: LABPROT, INR,  in the last 72 hours Urine Drug Screen: Drugs of Abuse  No results found for this basename: labopia, cocainscrnur, labbenz, amphetmu, thcu, labbarb    Alcohol Level: No results found for this basename: ETH,  in the last 72 hours Urinalysis: No results found for this basename: COLORURINE, APPERANCEUR, LABSPEC, PHURINE, GLUCOSEU, HGBUR, BILIRUBINUR, KETONESUR, PROTEINUR, UROBILINOGEN, NITRITE, LEUKOCYTESUR,  in the last 72 hours Misc. Labs:  ABGS No results found for this basename: PHART, PCO2, PO2ART, TCO2, HCO3,  in the last 72 hours CULTURES Recent Results (from the past 240 hour(s))  URINE CULTURE     Status: None   Collection Time    02/28/12  6:30 PM      Result Value Range Status   Specimen Description URINE, CATHETERIZED   Final   Special Requests NONE   Final   Culture  Setup Time 02/29/2012 01:43   Final   Colony Count >=100,000 COLONIES/ML   Final   Culture ESCHERICHIA COLI   Final   Report Status 03/02/2012 FINAL   Final   Organism ID, Bacteria ESCHERICHIA COLI   Final   Studies/Results: Ct Head Wo Contrast  03/04/2012  *RADIOLOGY REPORT*  Clinical Data: Altered mental status and confusion.  CT HEAD WITHOUT CONTRAST  Technique:  Contiguous axial images were obtained from the base of the skull through the vertex without contrast.  Comparison: 06/16/2006 CT  Findings: Remote infarcts within the left frontal lobe and bilateral basal ganglia noted. Chronic small vessel white matter ischemic changes are  identified.  No acute intracranial abnormalities are identified, including mass lesion or mass effect, hydrocephalus, extra-axial fluid collection, midline shift, hemorrhage, or acute infarction.  The visualized bony calvarium is unremarkable.  IMPRESSION: No evidence of acute intracranial abnormality.  Remote infarcts and chronic small vessel white matter ischemic changes.   Original Report Authenticated By: Margarette Canada, M.D.     Medications:  Prior to Admission:  Prescriptions prior to admission  Medication Sig Dispense Refill  . acetaminophen (TYLENOL) 500 MG tablet Take 500 mg by mouth every 6 (six) hours as needed for pain.      Marland Kitchen amLODipine-benazepril (LOTREL) 10-20 MG per capsule Take 1 capsule by mouth daily.      . diazepam (VALIUM) 5 MG tablet Take 5 mg by mouth at bedtime.      Marland Kitchen oxybutynin (DITROPAN-XL) 10 MG 24 hr tablet Take 10 mg by mouth daily.       Scheduled: . amLODipine  10 mg Oral Daily   And  . benazepril  20 mg Oral Daily  . cefTRIAXone (ROCEPHIN)  IV  1 g Intravenous Q24H  . ciprofloxacin  500 mg Oral BID  . diazepam  5 mg Oral QHS  . enoxaparin (LOVENOX) injection  40 mg Subcutaneous Q24H  . oxybutynin  10 mg Oral Daily  . potassium chloride  20 mEq Oral BID   Continuous: . sodium chloride 20 mL/hr at 03/02/12 1232   [id number], LORazepam  Assesment:she has a UTI and has delirium from that. She is on the appropriate antibiotic.  Principal Problem:   Urinary tract infection, site not specified Active Problems:   Acute delirium   Essential hypertension, benign   Hypokalemia    Plan:no change in treatments continue current medications    LOS: 5 days   [name] L 03/04/2012, 5:55 PM

## 2012-03-04 NOTE — Progress Notes (Signed)
[id number] 

## 2012-03-05 DIAGNOSIS — N390 Urinary tract infection, site not specified: Secondary | ICD-10-CM | POA: Diagnosis not present

## 2012-03-05 DIAGNOSIS — R4182 Altered mental status, unspecified: Secondary | ICD-10-CM | POA: Diagnosis not present

## 2012-03-05 LAB — CBC
HCT: 41.2 % (ref 36.0–46.0)
Platelets: 227 10*3/uL (ref 150–400)
RBC: 4.48 MIL/uL (ref 3.87–5.11)
RDW: 12.5 % (ref 11.5–15.5)
WBC: 7.9 10*3/uL (ref 4.0–10.5)

## 2012-03-05 LAB — BASIC METABOLIC PANEL
BUN: 16 mg/dL (ref 6–23)
CO2: 24 mEq/L (ref 19–32)
Chloride: 102 mEq/L (ref 96–112)
GFR calc Af Amer: 84 mL/min — ABNORMAL LOW (ref >90)
Potassium: 3.8 mEq/L (ref 3.5–5.1)

## 2012-03-05 MED ORDER — CIPROFLOXACIN HCL 500 MG PO TABS: 500.0000 mg | ORAL_TABLET | Freq: Two times a day (BID) | ORAL | Status: DC

## 2012-03-05 NOTE — Progress Notes (Signed)
Discharge instructions given to pt, & pt.'s daughter with teach back given  to RN. Pt. Taken to car via W/C.`

## 2012-03-06 NOTE — Discharge Summary (Signed)
Physician Discharge Summary  Patient ID: [name] MRN: [id number] DOB/AGE: [birth year] 79 y.o. Primary Care Physician:No primary provider on file. Admit date: 02/28/2012 Discharge date: 03/06/2012    Discharge Diagnoses:   Principal Problem:   Urinary tract infection, site not specified Active Problems:   Acute delirium   Essential hypertension, benign   Hypokalemia     Medication List    TAKE these medications       acetaminophen 500 MG tablet  Commonly known as:  TYLENOL  Take 500 mg by mouth every 6 (six) hours as needed for pain.     amLODipine-benazepril 10-20 MG per capsule  Commonly known as:  LOTREL  Take 1 capsule by mouth daily.     ciprofloxacin 500 MG tablet  Commonly known as:  CIPRO  Take 1 tablet (500 mg total) by mouth 2 (two) times daily.     diazepam 5 MG tablet  Commonly known as:  VALIUM  Take 5 mg by mouth at bedtime.     oxybutynin 10 MG 24 hr tablet  Commonly known as:  DITROPAN-XL  Take 10 mg by mouth daily.        Discharged Condition: Improved    Consults: None  Significant Diagnostic Studies: Dg Chest 2 View  02/28/2012  *RADIOLOGY REPORT*  Clinical Data:  Altered mental status.  No chest complaints.  CHEST - 2 VIEW  Comparison: 04/09/2010.  Findings: Cardiomegaly.  Calcified tortuous aorta.  Clear lung fields.   Osteopenia.  Similar appearance to priors.  IMPRESSION: No active cardiopulmonary disease.   Original Report Authenticated By: Rolla Flatten, M.D.    Ct Head Wo Contrast  03/04/2012  *RADIOLOGY REPORT*  Clinical Data: Altered mental status and confusion.  CT HEAD WITHOUT CONTRAST  Technique:  Contiguous axial images were obtained from the base of the skull through the vertex without contrast.  Comparison: 06/16/2006 CT  Findings: Remote infarcts within the left frontal lobe and bilateral basal ganglia noted. Chronic small vessel white matter ischemic changes are identified.  No acute intracranial abnormalities are  identified, including mass lesion or mass effect, hydrocephalus, extra-axial fluid collection, midline shift, hemorrhage, or acute infarction.  The visualized bony calvarium is unremarkable.  IMPRESSION: No evidence of acute intracranial abnormality.  Remote infarcts and chronic small vessel white matter ischemic changes.   Original Report Authenticated By: Margarette Canada, M.D.     Lab Results: Basic Metabolic Panel:  Recent Labs  03/04/12 0610 03/04/12 1006 03/05/12 0528  NA 138  --  139  K 3.7  --  3.8  CL 101  --  102  CO2 28  --  24  GLUCOSE 106*  --  96  BUN 8  --  16  CREATININE 0.65  --  0.75  CALCIUM 9.4  --  9.2  MG  --  1.8  --    Liver Function Tests: No results found for this basename: AST, ALT, ALKPHOS, BILITOT, PROT, ALBUMIN,  in the last 72 hours   CBC:  Recent Labs  03/04/12 1006 03/05/12 0528  WBC 7.8 7.9  NEUTROABS 5.6  --   HGB 14.0 13.8  HCT 41.4 41.2  MCV 90.8 92.0  PLT 251 227    Recent Results (from the past 240 hour(s))  URINE CULTURE     Status: None   Collection Time    02/28/12  6:30 PM      Result Value Range Status   Specimen Description URINE, CATHETERIZED   Final  Special Requests NONE   Final   Culture  Setup Time 02/29/2012 01:43   Final   Colony Count >=100,000 COLONIES/ML   Final   Culture ESCHERICHIA COLI   Final   Report Status 03/02/2012 FINAL   Final   Organism ID, Bacteria ESCHERICHIA COLI   Final     Hospital Course: She came to the hospital because of confusion. Family members had called her and she did not answer the phone when he went to check on her she was markedly confused. She was brought to the emergency department and found to have what appeared to be a urinary tract infection. This eventually grew Escherichia coli. She was started on IV antibiotics and improved and had a somewhat fluctuating course of her delirium. By the time of discharge however she was much improved back at her normal mental status but still  somewhat weak.  Discharge Exam: Blood pressure 134/56, pulse 73, temperature 98 F (36.7 C), temperature source Oral, resp. rate 18, height 5\' 2"  (1.575 m), weight 54.885 kg (121 lb), SpO2 96.00%. She is awake and alert. Her chest is clear. Her heart is regular. Mental status is back to baseline.  Disposition: Home with home health services      Discharge Orders   Future Orders Complete By Expires     Discharge patient  As directed     Face-to-face encounter  As directed     Comments:      I Ellary Casamento L certify that this patient is under my care and that I, or a nurse practitioner or physician's assistant working with me, had a face-to-face encounter that meets the physician face-to-face encounter requirements with this patient on 03/05/2012. The encounter with the patient was in whole, or in part for the following medical condition(s) which is the primary reason for home health care (List medical condition): UTI/delerium    Questions:      The encounter with the patient was in whole, or in part, for the following medical condition, which is the primary reason for home health care:  UTI/delerium    I certify that, based on my findings, the following services are medically necessary home health services:  Nursing    Physical therapy    My clinical findings support the need for the above services:  Cognitive impairments, dementia, or mental confusion  that make it unsafe to leave home    Further, I certify that my clinical findings support that this patient is homebound due to:  Unsafe ambulation due to balance issues    To provide the following care/treatments:  PT    [place]  As directed     Questions:      To provide the following care/treatments:  PT    RN       Follow-up Information   Follow up with [facility]   Contact information:   [address] [place] [address] [phone]      Signed: Alonza Bogus Pager [phone]  03/06/2012,  7:53 AM

## 2012-03-08 DIAGNOSIS — Z8673 Personal history of transient ischemic attack (TIA), and cerebral infarction without residual deficits: Secondary | ICD-10-CM | POA: Diagnosis not present

## 2012-03-08 DIAGNOSIS — N390 Urinary tract infection, site not specified: Secondary | ICD-10-CM | POA: Diagnosis not present

## 2012-03-08 DIAGNOSIS — J449 Chronic obstructive pulmonary disease, unspecified: Secondary | ICD-10-CM | POA: Diagnosis not present

## 2012-03-09 NOTE — Progress Notes (Signed)
UR Chart Review Completed  

## 2012-03-12 DIAGNOSIS — A499 Bacterial infection, unspecified: Secondary | ICD-10-CM | POA: Diagnosis not present

## 2012-03-12 DIAGNOSIS — J449 Chronic obstructive pulmonary disease, unspecified: Secondary | ICD-10-CM | POA: Diagnosis not present

## 2012-03-12 DIAGNOSIS — N390 Urinary tract infection, site not specified: Secondary | ICD-10-CM | POA: Diagnosis not present

## 2012-03-12 DIAGNOSIS — Z8673 Personal history of transient ischemic attack (TIA), and cerebral infarction without residual deficits: Secondary | ICD-10-CM | POA: Diagnosis not present

## 2012-03-12 DIAGNOSIS — I10 Essential (primary) hypertension: Secondary | ICD-10-CM | POA: Diagnosis not present

## 2012-03-15 DIAGNOSIS — N390 Urinary tract infection, site not specified: Secondary | ICD-10-CM | POA: Diagnosis not present

## 2012-03-15 DIAGNOSIS — I10 Essential (primary) hypertension: Secondary | ICD-10-CM | POA: Diagnosis not present

## 2012-03-15 DIAGNOSIS — J449 Chronic obstructive pulmonary disease, unspecified: Secondary | ICD-10-CM | POA: Diagnosis not present

## 2012-03-15 DIAGNOSIS — A499 Bacterial infection, unspecified: Secondary | ICD-10-CM | POA: Diagnosis not present

## 2012-03-15 DIAGNOSIS — Z8673 Personal history of transient ischemic attack (TIA), and cerebral infarction without residual deficits: Secondary | ICD-10-CM | POA: Diagnosis not present

## 2012-03-20 DIAGNOSIS — I10 Essential (primary) hypertension: Secondary | ICD-10-CM | POA: Diagnosis not present

## 2012-03-20 DIAGNOSIS — A499 Bacterial infection, unspecified: Secondary | ICD-10-CM | POA: Diagnosis not present

## 2012-03-20 DIAGNOSIS — J449 Chronic obstructive pulmonary disease, unspecified: Secondary | ICD-10-CM | POA: Diagnosis not present

## 2012-03-20 DIAGNOSIS — N390 Urinary tract infection, site not specified: Secondary | ICD-10-CM | POA: Diagnosis not present

## 2012-03-20 DIAGNOSIS — Z8673 Personal history of transient ischemic attack (TIA), and cerebral infarction without residual deficits: Secondary | ICD-10-CM | POA: Diagnosis not present

## 2012-04-12 DIAGNOSIS — J449 Chronic obstructive pulmonary disease, unspecified: Secondary | ICD-10-CM | POA: Diagnosis not present

## 2012-04-12 DIAGNOSIS — Z8673 Personal history of transient ischemic attack (TIA), and cerebral infarction without residual deficits: Secondary | ICD-10-CM | POA: Diagnosis not present

## 2012-04-12 DIAGNOSIS — I10 Essential (primary) hypertension: Secondary | ICD-10-CM | POA: Diagnosis not present

## 2012-04-12 DIAGNOSIS — A499 Bacterial infection, unspecified: Secondary | ICD-10-CM | POA: Diagnosis not present

## 2012-04-12 DIAGNOSIS — N390 Urinary tract infection, site not specified: Secondary | ICD-10-CM | POA: Diagnosis not present

## 2012-04-23 DIAGNOSIS — A499 Bacterial infection, unspecified: Secondary | ICD-10-CM | POA: Diagnosis not present

## 2012-04-23 DIAGNOSIS — I10 Essential (primary) hypertension: Secondary | ICD-10-CM | POA: Diagnosis not present

## 2012-04-23 DIAGNOSIS — N390 Urinary tract infection, site not specified: Secondary | ICD-10-CM | POA: Diagnosis not present

## 2012-04-23 DIAGNOSIS — Z8673 Personal history of transient ischemic attack (TIA), and cerebral infarction without residual deficits: Secondary | ICD-10-CM | POA: Diagnosis not present

## 2012-04-23 DIAGNOSIS — J449 Chronic obstructive pulmonary disease, unspecified: Secondary | ICD-10-CM | POA: Diagnosis not present

## 2012-05-01 DIAGNOSIS — I10 Essential (primary) hypertension: Secondary | ICD-10-CM | POA: Diagnosis not present

## 2012-05-01 DIAGNOSIS — N390 Urinary tract infection, site not specified: Secondary | ICD-10-CM | POA: Diagnosis not present

## 2012-05-01 DIAGNOSIS — J449 Chronic obstructive pulmonary disease, unspecified: Secondary | ICD-10-CM | POA: Diagnosis not present

## 2012-05-01 DIAGNOSIS — Z8673 Personal history of transient ischemic attack (TIA), and cerebral infarction without residual deficits: Secondary | ICD-10-CM | POA: Diagnosis not present

## 2012-05-01 DIAGNOSIS — A499 Bacterial infection, unspecified: Secondary | ICD-10-CM | POA: Diagnosis not present

## 2013-02-17 ENCOUNTER — Emergency Department (HOSPITAL_COMMUNITY): Payer: Medicare Other

## 2013-02-17 ENCOUNTER — Encounter (HOSPITAL_COMMUNITY): Payer: Self-pay | Admitting: Emergency Medicine

## 2013-02-17 ENCOUNTER — Emergency Department (HOSPITAL_COMMUNITY)
Admission: EM | Admit: 2013-02-17 | Discharge: 2013-02-17 | Disposition: A | Discharge status: Home / Self Care | Payer: Medicare Other | Attending: Emergency Medicine | Admitting: Emergency Medicine

## 2013-02-17 DIAGNOSIS — Z79899 Other long term (current) drug therapy: Secondary | ICD-10-CM | POA: Insufficient documentation

## 2013-02-17 DIAGNOSIS — R51 Headache: Secondary | ICD-10-CM | POA: Insufficient documentation

## 2013-02-17 DIAGNOSIS — R42 Dizziness and giddiness: Secondary | ICD-10-CM | POA: Insufficient documentation

## 2013-02-17 DIAGNOSIS — R519 Headache, unspecified: Secondary | ICD-10-CM

## 2013-02-17 DIAGNOSIS — Z8673 Personal history of transient ischemic attack (TIA), and cerebral infarction without residual deficits: Secondary | ICD-10-CM | POA: Insufficient documentation

## 2013-02-17 DIAGNOSIS — Z792 Long term (current) use of antibiotics: Secondary | ICD-10-CM | POA: Diagnosis not present

## 2013-02-17 DIAGNOSIS — F411 Generalized anxiety disorder: Secondary | ICD-10-CM | POA: Diagnosis not present

## 2013-02-17 DIAGNOSIS — I10 Essential (primary) hypertension: Secondary | ICD-10-CM | POA: Insufficient documentation

## 2013-02-17 DIAGNOSIS — R6889 Other general symptoms and signs: Secondary | ICD-10-CM | POA: Diagnosis not present

## 2013-02-17 HISTORY — DX: Anxiety disorder, unspecified: F41.9

## 2013-02-17 LAB — CBC WITH DIFFERENTIAL/PLATELET
BASOS ABS: 0 10*3/uL (ref 0.0–0.1)
BASOS PCT: 0 % (ref 0–1)
EOS ABS: 0.2 10*3/uL (ref 0.0–0.7)
Eosinophils Relative: 3 % (ref 0–5)
HCT: 42.6 % (ref 36.0–46.0)
HEMOGLOBIN: 14.2 g/dL (ref 12.0–15.0)
Lymphocytes Relative: 28 % (ref 12–46)
Lymphs Abs: 2 10*3/uL (ref 0.7–4.0)
MCH: 30.8 pg (ref 26.0–34.0)
MCHC: 33.3 g/dL (ref 30.0–36.0)
MCV: 92.4 fL (ref 78.0–100.0)
Monocytes Absolute: 0.6 10*3/uL (ref 0.1–1.0)
Monocytes Relative: 9 % (ref 3–12)
NEUTROS ABS: 4.3 10*3/uL (ref 1.7–7.7)
NEUTROS PCT: 60 % (ref 43–77)
PLATELETS: 228 10*3/uL (ref 150–400)
RBC: 4.61 MIL/uL (ref 3.87–5.11)
RDW: 12.8 % (ref 11.5–15.5)
WBC: 7.2 10*3/uL (ref 4.0–10.5)

## 2013-02-17 LAB — BASIC METABOLIC PANEL
BUN: 19 mg/dL (ref 6–23)
CALCIUM: 9.5 mg/dL (ref 8.4–10.5)
CO2: 29 mEq/L (ref 19–32)
CREATININE: 0.87 mg/dL (ref 0.50–1.10)
Chloride: 99 mEq/L (ref 96–112)
GFR, EST AFRICAN AMERICAN: 66 mL/min — AB (ref >90)
GFR, EST NON AFRICAN AMERICAN: 57 mL/min — AB (ref >90)
Glucose, Bld: 108 mg/dL — ABNORMAL HIGH (ref 70–99)
POTASSIUM: 4.4 meq/L (ref 3.7–5.3)
Sodium: 140 mEq/L (ref 137–147)

## 2013-02-17 LAB — SEDIMENTATION RATE: SED RATE: 8 mm/h (ref 0–22)

## 2013-02-17 NOTE — ED Notes (Signed)
Pt to department via EMS.  Per report, pt lives alone.  C/O headache and ringing in ears.  Per report, pt's blood pressure was 204/123 on scene.  Pt is not prescribed BP medications.

## 2013-02-17 NOTE — ED Provider Notes (Signed)
CSN: [id number]     Arrival date & time 02/17/13  0017 History   First MD Initiated Contact with Patient 02/17/13 [phone]     Chief Complaint  Patient presents with  . Headache    Patient is a 80 y.o. female presenting with headaches. The history is provided by the patient.  Headache Pain location:  Generalized Quality:  Dull Radiates to:  Does not radiate Onset quality:  Gradual Duration: several days ago. Timing:  Constant Progression:  Unchanged Chronicity:  New Context comment:  Medications Relieved by:  Nothing Worsened by:  Nothing tried Associated symptoms: dizziness   Associated symptoms: no abdominal pain, no fever, no focal weakness, no visual change and no vomiting   pt reports she recently was placed on cipro for a uti, and after starting this medication she noticed headache and "ringing in my ears"  No new weakness No falls reported No new visual changes She is ambulatory at home with a walker and is at her baseline  She called EMS due to her HA and dizziness  Past Medical History  Diagnosis Date  . Hypertension   . Stroke   . Anxiety    Past Surgical History  Procedure Laterality Date  . Joint replacement    . Hip adductor tenotomy     No family history on file. History  Substance Use Topics  . Smoking status: Never Smoker   . Smokeless tobacco: Not on file  . Alcohol Use: No   OB History   Grav Para Term Preterm Abortions TAB SAB Ect Mult Living                 Review of Systems  Constitutional: Negative for fever.  Eyes: Negative for visual disturbance.  Respiratory: Negative for shortness of breath.   Cardiovascular: Negative for chest pain.  Gastrointestinal: Negative for vomiting and abdominal pain.  Neurological: Positive for dizziness and headaches. Negative for focal weakness and weakness.  All other systems reviewed and are negative.    Allergies  Sulfa antibiotics  Home Medications   Current Outpatient Rx  Name  Route  Sig   Dispense  Refill  . acetaminophen (TYLENOL) 500 MG tablet   Oral   Take 500 mg by mouth every 6 (six) hours as needed for pain.         Marland Kitchen amLODipine-benazepril (LOTREL) 10-20 MG per capsule   Oral   Take 1 capsule by mouth daily.         . ciprofloxacin (CIPRO) 500 MG tablet   Oral   Take 1 tablet (500 mg total) by mouth 2 (two) times daily.   10 tablet   0   . diazepam (VALIUM) 5 MG tablet   Oral   Take 5 mg by mouth at bedtime.         Marland Kitchen oxybutynin (DITROPAN-XL) 10 MG 24 hr tablet   Oral   Take 10 mg by mouth daily.          BP 190/91  Pulse 86  Temp(Src) 98.3 F (36.8 C) (Oral)  Resp 16  Ht 5\' 2"  (1.575 m)  Wt 125 lb (56.7 kg)  BMI 22.86 kg/m2  SpO2 95% Physical Exam CONSTITUTIONAL: Well developed/well nourished HEAD: Normocephalic/atraumatic, no bruising or signs of trauma EYES: EOMI/PERRL, no nystagmus, no corneal hazing, no erythema noted ENMT: Mucous membranes moist, right TM occluded by cerumen, left TM partially obscured by cerumen NECK: supple no meningeal signs, no bruits CV: S1/S2 noted, no murmurs/rubs/gallops noted  LUNGS: Lungs are clear to auscultation bilaterally, no apparent distress ABDOMEN: soft, nontender, no rebound or guarding NEURO:Awake/alert, facies symmetric, no arm or leg drift is noted Cranial nerves 3/4/5/6/07/25/08/11/12 tested and intact Gait normal without ataxia (she uses walker but has steady gait) EXTREMITIES: pulses normal, full ROM SKIN: warm, color normal PSYCH: no abnormalities of mood noted   ED Course  Procedures (including critical care time) 1:55 AM Pt very well appearing.  She immediately speaks about her UTI and seems most concerned about this issue.  She thinks her HA is caused by cipro.  She has no neuro deficits on initial assessment, but she is hypertensive.  Given her age and her headache, will obtain CT head (last CT head in system was last February) 3:11 AM Pt resting comfortably She denies any current  HA She has no focal neuro deficits.  I feel she is clinically stable for d/c home She does live at home alone but apparently is self sufficient with walker I did advise PCP followup this week for re-evaluation and further management of her BP Labs Review Labs Reviewed  BASIC METABOLIC PANEL - Abnormal; Notable for the following:    Glucose, Bld 108 (*)    GFR calc non Af Amer 57 (*)    GFR calc Af Amer 66 (*)    All other components within normal limits  CBC WITH DIFFERENTIAL  SEDIMENTATION RATE   Imaging Review No results found.  EKG Interpretation   None       MDM  No diagnosis found. Nursing notes including past medical history and social history reviewed and considered in documentation Labs/vital reviewed and considered     Sharyon Cable, MD 02/17/13 [phone]

## 2013-02-17 NOTE — ED Notes (Signed)
Patient has steady gait with use of walker. Standby assist to restroom. Patient voided and sample obtained. Patient is in CT at this time.

## 2013-02-17 NOTE — ED Notes (Signed)
Patient placed on monitor at this time.

## 2013-02-17 NOTE — Discharge Instructions (Signed)

## 2013-02-17 NOTE — ED Notes (Signed)
Pt resting in bed while we try and find her a ride home.

## 2013-02-28 DIAGNOSIS — G4700 Insomnia, unspecified: Secondary | ICD-10-CM | POA: Diagnosis not present

## 2013-02-28 DIAGNOSIS — I10 Essential (primary) hypertension: Secondary | ICD-10-CM | POA: Diagnosis not present

## 2013-02-28 DIAGNOSIS — N318 Other neuromuscular dysfunction of bladder: Secondary | ICD-10-CM | POA: Diagnosis not present

## 2013-06-26 DIAGNOSIS — F411 Generalized anxiety disorder: Secondary | ICD-10-CM | POA: Diagnosis not present

## 2013-06-26 DIAGNOSIS — M1990 Unspecified osteoarthritis, unspecified site: Secondary | ICD-10-CM | POA: Diagnosis not present

## 2013-06-26 DIAGNOSIS — I10 Essential (primary) hypertension: Secondary | ICD-10-CM | POA: Diagnosis not present

## 2013-06-26 DIAGNOSIS — N318 Other neuromuscular dysfunction of bladder: Secondary | ICD-10-CM | POA: Diagnosis not present

## 2013-12-19 DIAGNOSIS — Z961 Presence of intraocular lens: Secondary | ICD-10-CM | POA: Diagnosis not present

## 2013-12-19 DIAGNOSIS — H3530 Unspecified macular degeneration: Secondary | ICD-10-CM | POA: Diagnosis not present

## 2013-12-19 DIAGNOSIS — H1851 Endothelial corneal dystrophy: Secondary | ICD-10-CM | POA: Diagnosis not present

## 2013-12-26 DIAGNOSIS — H542 Low vision, both eyes: Secondary | ICD-10-CM | POA: Diagnosis not present

## 2013-12-26 DIAGNOSIS — M1990 Unspecified osteoarthritis, unspecified site: Secondary | ICD-10-CM | POA: Diagnosis not present

## 2013-12-26 DIAGNOSIS — I639 Cerebral infarction, unspecified: Secondary | ICD-10-CM | POA: Diagnosis not present

## 2013-12-26 DIAGNOSIS — Z23 Encounter for immunization: Secondary | ICD-10-CM | POA: Diagnosis not present

## 2013-12-26 DIAGNOSIS — I10 Essential (primary) hypertension: Secondary | ICD-10-CM | POA: Diagnosis not present

## 2014-06-27 DIAGNOSIS — I10 Essential (primary) hypertension: Secondary | ICD-10-CM | POA: Diagnosis not present

## 2014-06-27 DIAGNOSIS — F419 Anxiety disorder, unspecified: Secondary | ICD-10-CM | POA: Diagnosis not present

## 2014-06-27 DIAGNOSIS — M1990 Unspecified osteoarthritis, unspecified site: Secondary | ICD-10-CM | POA: Diagnosis not present

## 2014-06-27 DIAGNOSIS — H542 Low vision, both eyes: Secondary | ICD-10-CM | POA: Diagnosis not present

## 2014-07-11 ENCOUNTER — Encounter (HOSPITAL_COMMUNITY): Payer: Self-pay | Admitting: *Deleted

## 2014-07-11 ENCOUNTER — Emergency Department (HOSPITAL_COMMUNITY): Payer: Medicare Other

## 2014-07-11 ENCOUNTER — Inpatient Hospital Stay (HOSPITAL_COMMUNITY)
Admission: EM | Admit: 2014-07-11 | Discharge: 2014-07-12 | DRG: 195 | Disposition: A | Discharge status: Home / Self Care | Payer: Medicare Other | Attending: Family Medicine | Admitting: Family Medicine

## 2014-07-11 DIAGNOSIS — R0781 Pleurodynia: Secondary | ICD-10-CM | POA: Diagnosis not present

## 2014-07-11 DIAGNOSIS — I10 Essential (primary) hypertension: Secondary | ICD-10-CM | POA: Diagnosis present

## 2014-07-11 DIAGNOSIS — J189 Pneumonia, unspecified organism: Secondary | ICD-10-CM | POA: Diagnosis not present

## 2014-07-11 DIAGNOSIS — Z8673 Personal history of transient ischemic attack (TIA), and cerebral infarction without residual deficits: Secondary | ICD-10-CM | POA: Diagnosis not present

## 2014-07-11 DIAGNOSIS — R270 Ataxia, unspecified: Secondary | ICD-10-CM | POA: Diagnosis not present

## 2014-07-11 DIAGNOSIS — F419 Anxiety disorder, unspecified: Secondary | ICD-10-CM | POA: Diagnosis present

## 2014-07-11 DIAGNOSIS — R079 Chest pain, unspecified: Secondary | ICD-10-CM | POA: Diagnosis present

## 2014-07-11 DIAGNOSIS — R918 Other nonspecific abnormal finding of lung field: Secondary | ICD-10-CM | POA: Diagnosis not present

## 2014-07-11 HISTORY — DX: Overactive bladder: N32.81

## 2014-07-11 LAB — CBC
HCT: 40.6 % (ref 36.0–46.0)
Hemoglobin: 13.6 g/dL (ref 12.0–15.0)
MCH: 30.8 pg (ref 26.0–34.0)
MCHC: 33.5 g/dL (ref 30.0–36.0)
MCV: 91.9 fL (ref 78.0–100.0)
PLATELETS: 202 10*3/uL (ref 150–400)
RBC: 4.42 MIL/uL (ref 3.87–5.11)
RDW: 13.1 % (ref 11.5–15.5)
WBC: 5.8 10*3/uL (ref 4.0–10.5)

## 2014-07-11 LAB — COMPREHENSIVE METABOLIC PANEL
ALBUMIN: 4.3 g/dL (ref 3.5–5.0)
ALT: 9 U/L — ABNORMAL LOW (ref 14–54)
AST: 18 U/L (ref 15–41)
Alkaline Phosphatase: 84 U/L (ref 38–126)
Anion gap: 11 (ref 5–15)
BUN: 16 mg/dL (ref 6–20)
CALCIUM: 9.7 mg/dL (ref 8.9–10.3)
CO2: 28 mmol/L (ref 22–32)
CREATININE: 0.88 mg/dL (ref 0.44–1.00)
Chloride: 101 mmol/L (ref 101–111)
GFR calc Af Amer: >60 mL/min (ref >60)
GFR calc non Af Amer: 56 mL/min — ABNORMAL LOW (ref >60)
Glucose, Bld: 109 mg/dL — ABNORMAL HIGH (ref 65–99)
Potassium: 3.6 mmol/L (ref 3.5–5.1)
Sodium: 140 mmol/L (ref 135–145)
Total Bilirubin: 1.3 mg/dL — ABNORMAL HIGH (ref 0.3–1.2)
Total Protein: 7.6 g/dL (ref 6.5–8.1)

## 2014-07-11 LAB — APTT: aPTT: 28 seconds (ref 24–37)

## 2014-07-11 LAB — PROTIME-INR
INR: 1.06 (ref 0.00–1.49)
Prothrombin Time: 14 seconds (ref 11.6–15.2)

## 2014-07-11 LAB — TROPONIN I: Troponin I: <0.03 ng/mL (ref <0.031)

## 2014-07-11 MED ORDER — DIAZEPAM 5 MG PO TABS
5.0000 mg | ORAL_TABLET | Freq: Every day | ORAL | Status: DC
  Administered 2014-07-11: 5 mg via ORAL
  Filled 2014-07-11: qty 1

## 2014-07-11 MED ORDER — ACETAMINOPHEN 500 MG PO TABS
1000.0000 mg | ORAL_TABLET | Freq: Every day | ORAL | Status: DC
  Administered 2014-07-11: 1000 mg via ORAL
  Filled 2014-07-11: qty 2

## 2014-07-11 MED ORDER — AMLODIPINE BESYLATE 5 MG PO TABS
10.0000 mg | ORAL_TABLET | Freq: Every day | ORAL | Status: DC
  Administered 2014-07-11 – 2014-07-12 (×2): 10 mg via ORAL
  Filled 2014-07-11 (×2): qty 2

## 2014-07-11 MED ORDER — CEFTRIAXONE SODIUM IN DEXTROSE 20 MG/ML IV SOLN
1.0000 g | INTRAVENOUS | Status: DC
  Filled 2014-07-11 (×2): qty 50

## 2014-07-11 MED ORDER — ASPIRIN 81 MG PO CHEW
324.0000 mg | CHEWABLE_TABLET | Freq: Once | ORAL | Status: AC
  Administered 2014-07-11: 324 mg via ORAL
  Filled 2014-07-11: qty 4

## 2014-07-11 MED ORDER — ENOXAPARIN SODIUM 40 MG/0.4ML ~~LOC~~ SOLN
40.0000 mg | SUBCUTANEOUS | Status: DC
Start: 2014-07-11 — End: 2014-07-12
  Administered 2014-07-11: 40 mg via SUBCUTANEOUS
  Filled 2014-07-11: qty 0.4

## 2014-07-11 MED ORDER — SODIUM CHLORIDE 0.9 % IV SOLN
1000.0000 mL | INTRAVENOUS | Status: DC
  Administered 2014-07-11 – 2014-07-12 (×2): 1000 mL via INTRAVENOUS

## 2014-07-11 MED ORDER — AZITHROMYCIN 500 MG IV SOLR
500.0000 mg | INTRAVENOUS | Status: DC
  Filled 2014-07-11 (×2): qty 500

## 2014-07-11 MED ORDER — BENAZEPRIL HCL 10 MG PO TABS
20.0000 mg | ORAL_TABLET | Freq: Every day | ORAL | Status: DC
  Administered 2014-07-11 – 2014-07-12 (×2): 20 mg via ORAL
  Filled 2014-07-11 (×2): qty 2

## 2014-07-11 MED ORDER — DEXTROSE 5 % IV SOLN
500.0000 mg | INTRAVENOUS | Status: DC
  Filled 2014-07-11 (×2): qty 500

## 2014-07-11 MED ORDER — OXYBUTYNIN CHLORIDE ER 5 MG PO TB24
10.0000 mg | ORAL_TABLET | Freq: Every day | ORAL | Status: DC
  Administered 2014-07-12: 10 mg via ORAL
  Filled 2014-07-11 (×4): qty 1
  Filled 2014-07-11: qty 2

## 2014-07-11 MED ORDER — AMLODIPINE BESY-BENAZEPRIL HCL 10-20 MG PO CAPS: 1.0000 | ORAL_CAPSULE | Freq: Every day | ORAL | Status: DC

## 2014-07-11 MED ORDER — AZITHROMYCIN 250 MG PO TABS
500.0000 mg | ORAL_TABLET | Freq: Once | ORAL | Status: AC
  Administered 2014-07-11: 500 mg via ORAL
  Filled 2014-07-11: qty 2

## 2014-07-11 MED ORDER — DEXTROSE 5 % IV SOLN
1.0000 g | Freq: Once | INTRAVENOUS | Status: AC
  Administered 2014-07-11: 1 g via INTRAVENOUS
  Filled 2014-07-11: qty 10

## 2014-07-11 MED ORDER — DEXTROSE 5 % IV SOLN
1.0000 g | INTRAVENOUS | Status: DC
  Filled 2014-07-11 (×2): qty 10

## 2014-07-11 NOTE — H&P (Signed)
[facility] History and Physical  [name] [id number] DOB: 01-Jun-1920 DOA: 07/11/2014  Referring physician: ER PCP: No primary care provider on file.   Chief Complaint: Left-sided chest pain  HPI: [name] is a 81 y.o. female  This is a 81 year old active lady who presents with left-sided chest pain below her breast for the last 2 days and this has been intermittent. It has not been associated with exertion or lifting or movement. She says that she wants to cough but cannot seem to cough. She does complain of some dyspnea. She also describes subjective fever. There is no nausea, vomiting, abdominal pain, palpitations or limb weakness. Evaluation in the emergency room shows her to have a probable left lower lobe pneumonia. She is now being admitted for further management.   Review of Systems:  Apart from symptoms above, all systems negative.  Past Medical History  Diagnosis Date  . Hypertension   . Stroke   . Anxiety   . Overactive bladder    Past Surgical History  Procedure Laterality Date  . Joint replacement    . Hip adductor tenotomy     Social History:  reports that she has never smoked. She does not have any smokeless tobacco history on file. She reports that she does not drink alcohol or use illicit drugs.  Allergies  Allergen Reactions  . Sulfa Antibiotics      Family history: No history of lung disease.   Prior to Admission medications   Medication Sig Start Date End Date Taking? Authorizing Provider  acetaminophen (TYLENOL) 500 MG tablet Take 1,000 mg by mouth at bedtime.    Yes Historical Provider, MD  amLODipine-benazepril (LOTREL) 10-20 MG per capsule Take 1 capsule by mouth daily.   Yes Historical Provider, MD  diazepam (VALIUM) 5 MG tablet Take 5 mg by mouth at bedtime.   Yes Historical Provider, MD  oxybutynin (DITROPAN-XL) 10 MG 24 hr tablet Take 10 mg by mouth daily.   Yes Historical Provider, MD   Physical Exam: Filed Vitals:   07/11/14 1300 07/11/14 1400 07/11/14 1430 07/11/14 1630  BP: 163/78 153/62 150/66 146/76  Pulse: 85 76 67 72  Temp:      TempSrc:      Resp: 25 18 18 12   Height:      Weight:      SpO2: 93% 92% 91% 95%    Wt Readings from Last 3 Encounters:  07/11/14 54.432 kg (120 lb)  02/17/13 56.7 kg (125 lb)  02/28/12 54.885 kg (121 lb)    General:  Appears calm and comfortable. She does not look toxic or septic. There is no increased work of breathing. Eyes: PERRL, normal lids, irises & conjunctiva ENT: grossly normal hearing, lips & tongue Neck: no LAD, masses or thyromegaly Cardiovascular: RRR, no m/r/g. No LE edema. Telemetry: SR, no arrhythmias  Respiratory: Inspiratory crackles more on the left lower lobe than the right. There is no bronchial breathing or wheezing. Abdomen: soft, ntnd Skin: no rash or induration seen on limited exam Musculoskeletal: grossly normal tone BUE/BLE Psychiatric: grossly normal mood and affect, speech fluent and appropriate Neurologic: grossly non-focal.          Labs on Admission:  Basic Metabolic Panel:  Recent Labs Lab 07/11/14 1311  NA 140  K 3.6  CL 101  CO2 28  GLUCOSE 109*  BUN 16  CREATININE 0.88  CALCIUM 9.7   Liver Function Tests:  Recent Labs Lab 07/11/14 1311  AST 18  ALT 9*  ALKPHOS 84  BILITOT 1.3*  PROT 7.6  ALBUMIN 4.3   No results for input(s): LIPASE, AMYLASE in the last 168 hours. No results for input(s): AMMONIA in the last 168 hours. CBC:  Recent Labs Lab 07/11/14 1311  WBC 5.8  HGB 13.6  HCT 40.6  MCV 91.9  PLT 202   Cardiac Enzymes:  Recent Labs Lab 07/11/14 1311  TROPONINI <0.03    BNP (last 3 results) No results for input(s): BNP in the last 8760 hours.  ProBNP (last 3 results) No results for input(s): PROBNP in the last 8760 hours.  CBG: No results for input(s): GLUCAP in the last 168 hours.  Radiological Exams on Admission: Dg Chest Portable 1 View  07/11/2014   CLINICAL DATA:   Left rib cage pain  EXAM: PORTABLE CHEST - 1 VIEW  COMPARISON:  02/28/2012  FINDINGS: Normal heart size. There is aortic atherosclerotic disease. Opacity in the left lung base is noted, suspicious for pneumonia.  IMPRESSION: 1. Suspect left lower lobe pneumonia.   Electronically Signed   By: Kerby Moors M.D.   On: 07/11/2014 14:10    EKG: Independently reviewed. Normal sinus rhythm without any acute ST-T wave changes. Suggestion of old MI.  Assessment/Plan   1. Community-acquired pneumonia. Left lower lobe. She will be treated with intravenous antibodies. Her oxygen requirement is not significant. 2. Hypertension. Stable. Continue with home medications.  Further recommendations will depend on patient's hospital progress.  Code Status: Full code.  DVT Prophylaxis: Lovenox.  Family Communication: I discussed the plan with the patient at the bedside.  Disposition Plan: Home when medically stable.   Time spent: 60 minutes.  Doree Albee [facility] Pager [phone].

## 2014-07-11 NOTE — ED Provider Notes (Signed)
CSN: [id number]     Arrival date & time 07/11/14  1248 History   First MD Initiated Contact with Patient 07/11/14 1305     Chief Complaint  Patient presents with  . Muscle Pain   Patient is a 81 y.o. female presenting with chest pain. The history is provided by the patient.  Chest Pain Pain location:  L chest (below her breast) Pain quality: aching   Pain radiates to:  Does not radiate Pain radiates to the back: no   Pain severity:  Moderate Onset quality:  Unable to specify Duration:  2 days Timing:  Intermittent Chronicity:  New Context: not lifting, no movement and not raising an arm   Relieved by:  Nothing Worsened by:  Nothing tried (She does a lot of house work.  She feels that certain positions causes some pain but movement does not bring on the pain specifically.) Ineffective treatments:  None tried Associated symptoms: shortness of breath (sometimes she feels like she has to take a deep breath when she gets the pain)   Associated symptoms: no abdominal pain, no back pain, no fever, not vomiting and no weakness   Risk factors: no coronary artery disease, no diabetes mellitus, no high cholesterol, no hypertension, no prior DVT/PE and no smoking     Past Medical History  Diagnosis Date  . Hypertension   . Stroke   . Anxiety   . Overactive bladder    Past Surgical History  Procedure Laterality Date  . Joint replacement    . Hip adductor tenotomy     No family history on file. History  Substance Use Topics  . Smoking status: Never Smoker   . Smokeless tobacco: Not on file  . Alcohol Use: No   OB History    No data available     Review of Systems  Constitutional: Negative for fever.  Respiratory: Positive for shortness of breath (sometimes she feels like she has to take a deep breath when she gets the pain).   Cardiovascular: Positive for chest pain.  Gastrointestinal: Negative for vomiting and abdominal pain.  Musculoskeletal: Negative for back pain.   Neurological: Negative for weakness.  All other systems reviewed and are negative.     Allergies  Sulfa antibiotics  Home Medications   Prior to Admission medications   Medication Sig Start Date End Date Taking? Authorizing Provider  acetaminophen (TYLENOL) 500 MG tablet Take 1,000 mg by mouth at bedtime.    Yes Historical Provider, MD  amLODipine-benazepril (LOTREL) 10-20 MG per capsule Take 1 capsule by mouth daily.   Yes Historical Provider, MD  diazepam (VALIUM) 5 MG tablet Take 5 mg by mouth at bedtime.   Yes Historical Provider, MD  oxybutynin (DITROPAN-XL) 10 MG 24 hr tablet Take 10 mg by mouth daily.   Yes Historical Provider, MD   BP 150/66 mmHg  Pulse 67  Temp(Src) 98.3 F (36.8 C) (Oral)  Resp 18  Ht 5\' 2"  (1.575 m)  Wt 120 lb (54.432 kg)  BMI 21.94 kg/m2  SpO2 91% Physical Exam  Constitutional: No distress.  HENT:  Head: Normocephalic and atraumatic.  Right Ear: External ear normal.  Left Ear: External ear normal.  Mouth/Throat: No oropharyngeal exudate.  Eyes: Conjunctivae are normal. Right eye exhibits no discharge. Left eye exhibits no discharge. No scleral icterus.  Neck: Neck supple. No tracheal deviation present.  Cardiovascular: Normal rate, regular rhythm and intact distal pulses.   Pulmonary/Chest: Effort normal and breath sounds normal. No stridor. No  respiratory distress. She has no wheezes. She has no rales. She exhibits no tenderness.  Abdominal: Soft. Bowel sounds are normal. She exhibits no distension. There is no tenderness. There is no rebound and no guarding.  Musculoskeletal: She exhibits no edema or tenderness.  Neurological: She is alert. She has normal strength. No cranial nerve deficit (no facial droop, extraocular movements intact, no slurred speech) or sensory deficit. She exhibits normal muscle tone. She displays no seizure activity. Coordination normal.  Decreased auditory acuity  Skin: Skin is warm and dry. No rash noted. She is not  diaphoretic.  Psychiatric: She has a normal mood and affect.  Nursing note and vitals reviewed.   ED Course  Procedures (including critical care time) Labs Review Labs Reviewed  COMPREHENSIVE METABOLIC PANEL - Abnormal; Notable for the following:    Glucose, Bld 109 (*)    ALT 9 (*)    Total Bilirubin 1.3 (*)    GFR calc non Af Amer 56 (*)    All other components within normal limits  APTT  CBC  PROTIME-INR  TROPONIN I  TROPONIN I  TROPONIN I    Imaging Review Dg Chest Portable 1 View  07/11/2014   CLINICAL DATA:  Left rib cage pain  EXAM: PORTABLE CHEST - 1 VIEW  COMPARISON:  02/28/2012  FINDINGS: Normal heart size. There is aortic atherosclerotic disease. Opacity in the left lung base is noted, suspicious for pneumonia.  IMPRESSION: 1. Suspect left lower lobe pneumonia.   Electronically Signed   By: Kerby Moors M.D.   On: 07/11/2014 14:10     EKG Interpretation   Date/Time:  Friday July 11 2014 12:56:52 EDT Ventricular Rate:  81 PR Interval:  196 QRS Duration: 95 QT Interval:  394 QTC Calculation: 457 R Axis:   15 Text Interpretation:  Sinus rhythm Anterior infarct, old No significant  change since last tracing Confirmed by Fay Bagg  MD-J, Gia Lusher [id number]) on  07/11/2014 1:13:14 PM     Medications  0.9 %  sodium chloride infusion (1,000 mLs Intravenous New Bag/Given 07/11/14 1347)  cefTRIAXone (ROCEPHIN) 1 g in dextrose 5 % 50 mL IVPB (not administered)  azithromycin (ZITHROMAX) tablet 500 mg (not administered)  aspirin chewable tablet 324 mg (324 mg Oral Given 07/11/14 1316)    MDM   Final diagnoses:  HCAP (healthcare-associated pneumonia)   Pt in retrospect has been having a cough recently.  CXR suggests a LLL PNA which could be causing her pain.  She is otherwise stable but with her age will consult with medical service for overnight observation, iv abx and serial cardiac enzymes.  Dorie Rank, MD 07/11/14 [phone]

## 2014-07-11 NOTE — ED Notes (Signed)
Pt c/o left rib cage pain since Wednesday, she reports doing a lot of house work this week and has had a pain under left breast since then, reports pain is relieved with pressure applied to area. EMS reports pt. Stated several times that she feels better when she has someone with her as she lives alone. Mail carrier called EMS.

## 2014-07-11 NOTE — ED Notes (Signed)
Hospitalist at bedside 

## 2014-07-11 NOTE — ED Notes (Signed)
MD at bedside. 

## 2014-07-12 DIAGNOSIS — J189 Pneumonia, unspecified organism: Principal | ICD-10-CM

## 2014-07-12 DIAGNOSIS — I10 Essential (primary) hypertension: Secondary | ICD-10-CM

## 2014-07-12 LAB — COMPREHENSIVE METABOLIC PANEL
ALK PHOS: 80 U/L (ref 38–126)
ALT: 10 U/L — AB (ref 14–54)
AST: 18 U/L (ref 15–41)
Albumin: 4 g/dL (ref 3.5–5.0)
Anion gap: 9 (ref 5–15)
BUN: 18 mg/dL (ref 6–20)
CALCIUM: 9 mg/dL (ref 8.9–10.3)
CO2: 27 mmol/L (ref 22–32)
Chloride: 102 mmol/L (ref 101–111)
Creatinine, Ser: 0.96 mg/dL (ref 0.44–1.00)
GFR calc Af Amer: 58 mL/min — ABNORMAL LOW (ref >60)
GFR, EST NON AFRICAN AMERICAN: 50 mL/min — AB (ref >60)
Glucose, Bld: 97 mg/dL (ref 65–99)
Potassium: 3.7 mmol/L (ref 3.5–5.1)
SODIUM: 138 mmol/L (ref 135–145)
TOTAL PROTEIN: 7.1 g/dL (ref 6.5–8.1)
Total Bilirubin: 1.6 mg/dL — ABNORMAL HIGH (ref 0.3–1.2)

## 2014-07-12 LAB — CBC
HEMATOCRIT: 39 % (ref 36.0–46.0)
HEMOGLOBIN: 12.8 g/dL (ref 12.0–15.0)
MCH: 30.2 pg (ref 26.0–34.0)
MCHC: 32.8 g/dL (ref 30.0–36.0)
MCV: 92 fL (ref 78.0–100.0)
Platelets: 234 10*3/uL (ref 150–400)
RBC: 4.24 MIL/uL (ref 3.87–5.11)
RDW: 13.2 % (ref 11.5–15.5)
WBC: 6.1 10*3/uL (ref 4.0–10.5)

## 2014-07-12 MED ORDER — CEFUROXIME AXETIL 500 MG PO TABS: 500.0000 mg | ORAL_TABLET | Freq: Two times a day (BID) | ORAL | Status: DC

## 2014-07-12 MED ORDER — CEFUROXIME AXETIL 250 MG PO TABS
500.0000 mg | ORAL_TABLET | Freq: Two times a day (BID) | ORAL | Status: DC
  Administered 2014-07-12: 500 mg via ORAL
  Filled 2014-07-12: qty 2

## 2014-07-12 MED ORDER — DEXTROSE 5 % IV SOLN
1.0000 g | INTRAVENOUS | Status: DC
  Filled 2014-07-12 (×2): qty 10

## 2014-07-12 MED ORDER — AZITHROMYCIN 250 MG PO TABS: 250.0000 mg | ORAL_TABLET | Freq: Every day | ORAL | Status: DC

## 2014-07-12 MED ORDER — AZITHROMYCIN 250 MG PO TABS
250.0000 mg | ORAL_TABLET | Freq: Every day | ORAL | Status: DC
  Administered 2014-07-12: 250 mg via ORAL
  Filled 2014-07-12: qty 1

## 2014-07-12 NOTE — Discharge Planning (Signed)
Pt VSS and RN assessment reveals stability.  Pt IV removed, DC paper given explained and educated. Pt and family told of needed FU appt and scripts at pharm.  Anbx X2 given before DC. Pt being wheeled to car and family driving pt home.

## 2014-07-12 NOTE — Progress Notes (Signed)
  PROGRESS NOTE  [name] [id number] DOB: [birth year] DOA: 07/11/2014 PCP: Alonza Bogus, MD  Summary: 81 year old woman presented with left-sided chest pain below her breast for 2 days, not clearly associated with exertion. Further evaluation revealed left lower lobe pneumonia.  Assessment/Plan: 1. Community acquired pneumonia. No hypoxia, no leukocytosis, pain much decreased. 2. Hypertension. Stable.   Feeling much better. No signs or symptoms of severe infection.  Home today on oral abx  I will discuss her care with Dr. Luan Pulling (her PCP, lately discovered).  Discussed with daughter at bedside  Murray Hodgkins, MD  [facility]  Pager [phone] If 7PM-7AM, please contact night-coverage at [email or web address], password [facility] 07/12/2014, 1:27 PM  LOS: 1 day   Consultants:    Procedures:    Antibiotics:  Azithromycin 6/24 >> 6/28  Ceftin 6/25 >> 6/30  Ceftriaxone 6/24   HPI/Subjective: Feeling better. Minimal chest pain ("100 percent better").  Objective: Filed Vitals:   07/11/14 1730 07/11/14 2316 07/12/14 0558 07/12/14 0948  BP: 175/67 163/65 119/59 122/64  Pulse: 82  64   Temp: 98.1 F (36.7 C)  97.9 F (36.6 C)   TempSrc: Oral Oral Oral   Resp: 17 20 20    Height: 5\' 2"  (1.575 m)     Weight: 53.57 kg (118 lb 1.6 oz)     SpO2: 96% 97% 93%     Intake/Output Summary (Last 24 hours) at 07/12/14 1327 Last data filed at 07/12/14 0900  Gross per 24 hour  Intake    400 ml  Output      4 ml  Net    396 ml     Filed Weights   07/11/14 1257 07/11/14 1730  Weight: 54.432 kg (120 lb) 53.57 kg (118 lb 1.6 oz)    Exam:     Afebrile, vital signs stable, no hypoxia General:  Appears calm and comfortable Cardiovascular: RRR, no m/r/g. No LE edema. Telemetry: SR, no arrhythmias  Respiratory: some coarse breath sounds on the left; good air movement, no wheezes or rales. Psychiatric: grossly normal mood and affect, speech fluent and  appropriate  New data reviewed:  Basic metabolic panel unremarkable.  Total bilirubin slightly elevated.  Troponins negative.  CBC unremarkable.  Chest x-ray suggested left lower lobe pneumonia.  Pertinent data since admission:  EKG sinus rhythm, anteroseptal MI, old. No acute changes.  Pending data:  Blood cultures  Scheduled Meds: . acetaminophen  1,000 mg Oral QHS  . amLODipine  10 mg Oral Daily   And  . benazepril  20 mg Oral Daily  . azithromycin  500 mg Intravenous Q24H  . cefTRIAXone (ROCEPHIN)  IV  1 g Intravenous Q24H  . diazepam  5 mg Oral QHS  . enoxaparin (LOVENOX) injection  40 mg Subcutaneous Q24H  . oxybutynin  10 mg Oral Daily   Continuous Infusions: . sodium chloride 1,000 mL (07/12/14 1027)    Active Problems:   Essential hypertension, benign   Community acquired pneumonia

## 2014-07-12 NOTE — Discharge Summary (Signed)
Physician Discharge Summary  [name] [id number] DOB: 1921/01/03 DOA: 07/11/2014  PCP: Alonza Bogus, MD  Admit date: 07/11/2014 Discharge date: 07/12/2014  Recommendations for Outpatient Follow-up:  1. Resolution of community-acquired pneumonia    Follow-up Information    Follow up with HAWKINS,EDWARD L, MD. Schedule an appointment as soon as possible for a visit in 2 weeks.   Specialty:  Pulmonary Disease   Contact information:   [address] [place] [address] [phone]      Discharge Diagnoses:  1. Community-acquired pneumonia  Discharge Condition: Improved Disposition: Home  Diet recommendation: Regular  Filed Weights   07/11/14 1257 07/11/14 1730  Weight: 54.432 kg (120 lb) 53.57 kg (118 lb 1.6 oz)    History of present illness:  81 year old woman presented with left-sided chest pain below her breast for 2 days, not clearly associated with exertion. Further evaluation revealed left lower lobe pneumonia.  Hospital Course:  [name] was treated with antibiotics and rapidly improved with marked diminishment of pain. No hypoxia or leukocytosis. Overall feeling much better today, ambulated to the bathroom without difficulty. Hospitalization was uncomplicated. Stable for discharge home.   I will discuss her care with Dr. Luan Pulling (her PCP, lately discovered).  Consultants:  None   Procedures:  None   Antibiotics:  Azithromycin 6/24 >> 6/28  Ceftin 6/25 >> 6/30  Ceftriaxone 6/24  Discharge Instructions  Discharge Instructions    Diet general    Complete by:  As directed      Discharge instructions    Complete by:  As directed   Call your physician or seek immediate medical attention for shortness of breath, fever, pain or worsening of condition.     Increase activity slowly    Complete by:  As directed           Current Discharge Medication List    START taking these medications   Details  azithromycin  (ZITHROMAX) 250 MG tablet Take 1 tablet (250 mg total) by mouth daily. Start 6/26 in the afternoon. Qty: 3 tablet, Refills: 0    cefUROXime (CEFTIN) 500 MG tablet Take 1 tablet (500 mg total) by mouth 2 (two) times daily with a meal. Start 6/26 in the morning. Qty: 10 tablet, Refills: 0      CONTINUE these medications which have NOT CHANGED   Details  acetaminophen (TYLENOL) 500 MG tablet Take 1,000 mg by mouth at bedtime.     amLODipine-benazepril (LOTREL) 10-20 MG per capsule Take 1 capsule by mouth daily.    diazepam (VALIUM) 5 MG tablet Take 5 mg by mouth at bedtime.    oxybutynin (DITROPAN-XL) 10 MG 24 hr tablet Take 10 mg by mouth daily.       Allergies  Allergen Reactions  . Sulfa Antibiotics     The results of significant diagnostics from this hospitalization (including imaging, microbiology, ancillary and laboratory) are listed below for reference.    Significant Diagnostic Studies: Dg Chest Portable 1 View  07/11/2014   CLINICAL DATA:  Left rib cage pain  EXAM: PORTABLE CHEST - 1 VIEW  COMPARISON:  02/28/2012  FINDINGS: Normal heart size. There is aortic atherosclerotic disease. Opacity in the left lung base is noted, suspicious for pneumonia.  IMPRESSION: 1. Suspect left lower lobe pneumonia.   Electronically Signed   By: Kerby Moors M.D.   On: 07/11/2014 14:10    Microbiology: Recent Results (from the past 240 hour(s))  Culture, blood (routine x 2) Call MD if  unable to obtain prior to antibiotics being given     Status: None (Preliminary result)   Collection Time: 07/11/14  5:10 PM  Result Value Ref Range Status   Specimen Description BLOOD RIGHT HAND  Final   Special Requests BOTTLES DRAWN AEROBIC ONLY [name]  Final   Culture PENDING  Incomplete   Report Status PENDING  Incomplete  Culture, blood (routine x 2) Call MD if unable to obtain prior to antibiotics being given     Status: None (Preliminary result)   Collection Time: 07/11/14  5:15 PM  Result Value Ref  Range Status   Specimen Description BLOOD RIGHT ARM  Final   Special Requests BOTTLES DRAWN AEROBIC AND ANAEROBIC [facility]  Final   Culture PENDING  Incomplete   Report Status PENDING  Incomplete     Labs: Basic Metabolic Panel:  Recent Labs Lab 07/11/14 1311 07/12/14 0554  NA 140 138  K 3.6 3.7  CL 101 102  CO2 28 27  GLUCOSE 109* 97  BUN 16 18  CREATININE 0.88 0.96  CALCIUM 9.7 9.0   Liver Function Tests:  Recent Labs Lab 07/11/14 1311 07/12/14 0554  AST 18 18  ALT 9* 10*  ALKPHOS 84 80  BILITOT 1.3* 1.6*  PROT 7.6 7.1  ALBUMIN 4.3 4.0   CBC:  Recent Labs Lab 07/11/14 1311 07/12/14 0554  WBC 5.8 6.1  HGB 13.6 12.8  HCT 40.6 39.0  MCV 91.9 92.0  PLT 202 234   Cardiac Enzymes:  Recent Labs Lab 07/11/14 1311 07/11/14 1558 07/11/14 1849  TROPONINI <0.03 <0.03 <0.03    Active Problems:   Essential hypertension, benign   Community acquired pneumonia   Time coordinating discharge: 35 minutes  Signed:  Murray Hodgkins, MD [facility] 07/12/2014, 2:19 PM

## 2014-07-16 LAB — CULTURE, BLOOD (ROUTINE X 2)
CULTURE: NO GROWTH
Culture: NO GROWTH

## 2014-07-25 DIAGNOSIS — J189 Pneumonia, unspecified organism: Secondary | ICD-10-CM | POA: Diagnosis not present

## 2014-07-25 DIAGNOSIS — I10 Essential (primary) hypertension: Secondary | ICD-10-CM | POA: Diagnosis not present

## 2014-07-25 DIAGNOSIS — M1990 Unspecified osteoarthritis, unspecified site: Secondary | ICD-10-CM | POA: Diagnosis not present

## 2014-07-25 DIAGNOSIS — F639 Impulse disorder, unspecified: Secondary | ICD-10-CM | POA: Diagnosis not present

## 2014-07-31 DIAGNOSIS — Z1211 Encounter for screening for malignant neoplasm of colon: Secondary | ICD-10-CM | POA: Diagnosis not present

## 2014-12-26 DIAGNOSIS — M1990 Unspecified osteoarthritis, unspecified site: Secondary | ICD-10-CM | POA: Diagnosis not present

## 2014-12-26 DIAGNOSIS — I10 Essential (primary) hypertension: Secondary | ICD-10-CM | POA: Diagnosis not present

## 2014-12-26 DIAGNOSIS — Z23 Encounter for immunization: Secondary | ICD-10-CM | POA: Diagnosis not present

## 2014-12-26 DIAGNOSIS — I639 Cerebral infarction, unspecified: Secondary | ICD-10-CM | POA: Diagnosis not present

## 2014-12-26 DIAGNOSIS — F419 Anxiety disorder, unspecified: Secondary | ICD-10-CM | POA: Diagnosis not present

## 2015-06-26 DIAGNOSIS — I10 Essential (primary) hypertension: Secondary | ICD-10-CM | POA: Diagnosis not present

## 2015-06-26 DIAGNOSIS — F419 Anxiety disorder, unspecified: Secondary | ICD-10-CM | POA: Diagnosis not present

## 2015-06-26 DIAGNOSIS — H9313 Tinnitus, bilateral: Secondary | ICD-10-CM | POA: Diagnosis not present

## 2015-06-26 DIAGNOSIS — N3281 Overactive bladder: Secondary | ICD-10-CM | POA: Diagnosis not present

## 2015-11-23 ENCOUNTER — Observation Stay (HOSPITAL_COMMUNITY)
Admission: EM | Admit: 2015-11-23 | Discharge: 2015-11-25 | Disposition: A | Discharge status: Home / Self Care | Payer: PPO | Attending: Pulmonary Disease | Admitting: Pulmonary Disease

## 2015-11-23 ENCOUNTER — Emergency Department (HOSPITAL_COMMUNITY): Payer: PPO

## 2015-11-23 ENCOUNTER — Inpatient Hospital Stay (HOSPITAL_COMMUNITY): Payer: PPO

## 2015-11-23 ENCOUNTER — Encounter (HOSPITAL_COMMUNITY): Payer: Self-pay | Admitting: Emergency Medicine

## 2015-11-23 DIAGNOSIS — M810 Age-related osteoporosis without current pathological fracture: Secondary | ICD-10-CM | POA: Diagnosis present

## 2015-11-23 DIAGNOSIS — S22050A Wedge compression fracture of T5-T6 vertebra, initial encounter for closed fracture: Secondary | ICD-10-CM | POA: Diagnosis not present

## 2015-11-23 DIAGNOSIS — E876 Hypokalemia: Secondary | ICD-10-CM | POA: Insufficient documentation

## 2015-11-23 DIAGNOSIS — N200 Calculus of kidney: Secondary | ICD-10-CM | POA: Diagnosis not present

## 2015-11-23 DIAGNOSIS — M549 Dorsalgia, unspecified: Secondary | ICD-10-CM | POA: Diagnosis not present

## 2015-11-23 DIAGNOSIS — N10 Acute tubulo-interstitial nephritis: Secondary | ICD-10-CM | POA: Diagnosis not present

## 2015-11-23 DIAGNOSIS — Z8673 Personal history of transient ischemic attack (TIA), and cerebral infarction without residual deficits: Secondary | ICD-10-CM | POA: Insufficient documentation

## 2015-11-23 DIAGNOSIS — I739 Peripheral vascular disease, unspecified: Secondary | ICD-10-CM | POA: Diagnosis present

## 2015-11-23 DIAGNOSIS — R110 Nausea: Secondary | ICD-10-CM | POA: Diagnosis not present

## 2015-11-23 DIAGNOSIS — N12 Tubulo-interstitial nephritis, not specified as acute or chronic: Secondary | ICD-10-CM | POA: Diagnosis present

## 2015-11-23 DIAGNOSIS — R52 Pain, unspecified: Secondary | ICD-10-CM | POA: Diagnosis not present

## 2015-11-23 DIAGNOSIS — G8929 Other chronic pain: Secondary | ICD-10-CM

## 2015-11-23 DIAGNOSIS — N390 Urinary tract infection, site not specified: Secondary | ICD-10-CM | POA: Diagnosis present

## 2015-11-23 DIAGNOSIS — M5489 Other dorsalgia: Secondary | ICD-10-CM | POA: Diagnosis not present

## 2015-11-23 DIAGNOSIS — I10 Essential (primary) hypertension: Secondary | ICD-10-CM | POA: Insufficient documentation

## 2015-11-23 DIAGNOSIS — M5126 Other intervertebral disc displacement, lumbar region: Secondary | ICD-10-CM | POA: Diagnosis not present

## 2015-11-23 DIAGNOSIS — M545 Low back pain: Secondary | ICD-10-CM

## 2015-11-23 DIAGNOSIS — M25551 Pain in right hip: Secondary | ICD-10-CM | POA: Diagnosis not present

## 2015-11-23 DIAGNOSIS — S32020A Wedge compression fracture of second lumbar vertebra, initial encounter for closed fracture: Secondary | ICD-10-CM | POA: Diagnosis present

## 2015-11-23 DIAGNOSIS — F0390 Unspecified dementia without behavioral disturbance: Secondary | ICD-10-CM | POA: Diagnosis not present

## 2015-11-23 DIAGNOSIS — M4850XA Collapsed vertebra, not elsewhere classified, site unspecified, initial encounter for fracture: Secondary | ICD-10-CM | POA: Diagnosis present

## 2015-11-23 DIAGNOSIS — M8008XA Age-related osteoporosis with current pathological fracture, vertebra(e), initial encounter for fracture: Secondary | ICD-10-CM | POA: Insufficient documentation

## 2015-11-23 LAB — URINALYSIS, ROUTINE W REFLEX MICROSCOPIC
Bilirubin Urine: NEGATIVE
GLUCOSE, UA: NEGATIVE mg/dL
KETONES UR: NEGATIVE mg/dL
Nitrite: POSITIVE — AB
PH: 8 (ref 5.0–8.0)
Protein, ur: NEGATIVE mg/dL
Specific Gravity, Urine: 1.01 (ref 1.005–1.030)

## 2015-11-23 LAB — I-STAT CHEM 8, ED
BUN: 27 mg/dL — AB (ref 6–20)
CHLORIDE: 104 mmol/L (ref 101–111)
CREATININE: 1 mg/dL (ref 0.44–1.00)
Calcium, Ion: 1.17 mmol/L (ref 1.15–1.40)
Glucose, Bld: 102 mg/dL — ABNORMAL HIGH (ref 65–99)
HEMATOCRIT: 43 % (ref 36.0–46.0)
Hemoglobin: 14.6 g/dL (ref 12.0–15.0)
POTASSIUM: 4.7 mmol/L (ref 3.5–5.1)
Sodium: 141 mmol/L (ref 135–145)
TCO2: 27 mmol/L (ref 0–100)

## 2015-11-23 LAB — CBC WITH DIFFERENTIAL/PLATELET
BASOS ABS: 0 10*3/uL (ref 0.0–0.1)
BASOS PCT: 0 %
Eosinophils Absolute: 0.1 10*3/uL (ref 0.0–0.7)
Eosinophils Relative: 1 %
HEMATOCRIT: 43.1 % (ref 36.0–46.0)
HEMOGLOBIN: 14.5 g/dL (ref 12.0–15.0)
Lymphocytes Relative: 17 %
Lymphs Abs: 1.7 10*3/uL (ref 0.7–4.0)
MCH: 30.9 pg (ref 26.0–34.0)
MCHC: 33.6 g/dL (ref 30.0–36.0)
MCV: 91.7 fL (ref 78.0–100.0)
Monocytes Absolute: 0.8 10*3/uL (ref 0.1–1.0)
Monocytes Relative: 8 %
NEUTROS ABS: 7.2 10*3/uL (ref 1.7–7.7)
NEUTROS PCT: 74 %
Platelets: 246 10*3/uL (ref 150–400)
RBC: 4.7 MIL/uL (ref 3.87–5.11)
RDW: 13.2 % (ref 11.5–15.5)
WBC: 9.7 10*3/uL (ref 4.0–10.5)

## 2015-11-23 LAB — CREATININE, SERUM
Creatinine, Ser: 1.05 mg/dL — ABNORMAL HIGH (ref 0.44–1.00)
GFR calc Af Amer: 51 mL/min — ABNORMAL LOW (ref >60)
GFR calc non Af Amer: 44 mL/min — ABNORMAL LOW (ref >60)

## 2015-11-23 LAB — URINE MICROSCOPIC-ADD ON: Squamous Epithelial / LPF: NONE SEEN

## 2015-11-23 MED ORDER — ONDANSETRON HCL 4 MG/2ML IJ SOLN: 4.0000 mg | Freq: Four times a day (QID) | INTRAMUSCULAR | Status: DC | PRN

## 2015-11-23 MED ORDER — HYDROMORPHONE HCL 1 MG/ML IJ SOLN
0.5000 mg | Freq: Once | INTRAMUSCULAR | Status: AC
  Administered 2015-11-23: 0.5 mg via INTRAVENOUS
  Filled 2015-11-23: qty 1

## 2015-11-23 MED ORDER — DEXTROSE 5 % IV SOLN
1.0000 g | Freq: Once | INTRAVENOUS | Status: AC
  Administered 2015-11-23: 1 g via INTRAVENOUS
  Filled 2015-11-23: qty 10

## 2015-11-23 MED ORDER — ALBUTEROL SULFATE (2.5 MG/3ML) 0.083% IN NEBU: 2.5000 mg | INHALATION_SOLUTION | RESPIRATORY_TRACT | Status: DC | PRN

## 2015-11-23 MED ORDER — TAMSULOSIN HCL 0.4 MG PO CAPS
0.4000 mg | ORAL_CAPSULE | Freq: Every day | ORAL | Status: DC
  Administered 2015-11-23 – 2015-11-25 (×3): 0.4 mg via ORAL
  Filled 2015-11-23 (×3): qty 1

## 2015-11-23 MED ORDER — DEXTROSE 5 % IV SOLN: 1.0000 g | Freq: Once | INTRAVENOUS | Status: DC

## 2015-11-23 MED ORDER — DIAZEPAM 5 MG PO TABS
5.0000 mg | ORAL_TABLET | Freq: Every day | ORAL | Status: DC
  Administered 2015-11-23 – 2015-11-24 (×2): 5 mg via ORAL
  Filled 2015-11-23 (×2): qty 1

## 2015-11-23 MED ORDER — ACETAMINOPHEN 500 MG PO TABS
1000.0000 mg | ORAL_TABLET | Freq: Every day | ORAL | Status: DC
  Administered 2015-11-23 – 2015-11-24 (×2): 1000 mg via ORAL
  Filled 2015-11-23 (×2): qty 2

## 2015-11-23 MED ORDER — HEPARIN SODIUM (PORCINE) 5000 UNIT/ML IJ SOLN
5000.0000 [IU] | Freq: Three times a day (TID) | INTRAMUSCULAR | Status: DC
  Administered 2015-11-23 – 2015-11-25 (×6): 5000 [IU] via SUBCUTANEOUS
  Filled 2015-11-23 (×6): qty 1

## 2015-11-23 MED ORDER — SODIUM CHLORIDE 0.9 % IV SOLN
INTRAVENOUS | Status: AC
  Administered 2015-11-23 – 2015-11-24 (×2): via INTRAVENOUS

## 2015-11-23 MED ORDER — HALOPERIDOL LACTATE 5 MG/ML IJ SOLN: 2.0000 mg | Freq: Four times a day (QID) | INTRAMUSCULAR | Status: DC | PRN

## 2015-11-23 MED ORDER — ONDANSETRON HCL 4 MG/2ML IJ SOLN
4.0000 mg | Freq: Once | INTRAMUSCULAR | Status: AC
  Administered 2015-11-23: 4 mg via INTRAVENOUS
  Filled 2015-11-23: qty 2

## 2015-11-23 MED ORDER — HYDROCODONE-ACETAMINOPHEN 5-325 MG PO TABS: 1.0000 | ORAL_TABLET | Freq: Four times a day (QID) | ORAL | Status: DC | PRN

## 2015-11-23 NOTE — ED Notes (Signed)
Pt urinated. Pt and bed linens cleaned.

## 2015-11-23 NOTE — ED Notes (Signed)
Pt returned from ct

## 2015-11-23 NOTE — ED Triage Notes (Signed)
Pt lives alone. Has been moving furiniture and now complaining of lower back pain. A/o. nad at this [facility]

## 2015-11-23 NOTE — H&P (Signed)
TRH H&P   Patient Demographics:    [name], is a 82 y.o. female  MRN: [id number]   DOB - May 09, 1920  Admit Date - 11/23/2015  Outpatient Primary MD for the patient is Alonza Bogus, MD  Outpatient Specialists: Dr Luan Pulling    Patient coming from: Home  Chief Complaint  Patient presents with  . Back Pain      HPI:    [name]  is a 82 y.o. female, With history of hypertension, mild early dementia, who lives alone walks with a walker comes in with one-week history of back pain which she is not too sure where it originates but points towards the flank area, unfortunately she at times says it's on the right side but but also at times says on the left, it's radiating to the front of her belly, she said a week ago she was trying to move some furniture and pain started at that time.   Her back pain is worse with activity better with rest, she Denies any fever chills, no chest pain cough or shortness of breath, no abdominal pain but mild nausea. She came to the ER where workup suggested nonobstructing left renal calculus, mild partial right-sided UPJ stone, possible UTI, I was called to admit the patient for pain control and possible pyelonephritis.  Besides above review of systems all other review of systems are negative    Review of systems:    In addition to the HPI above,  No Fever-chills, No Headache, No changes with Vision or hearing, No problems swallowing food or Liquids, No Chest pain, Cough or Shortness of Breath, No Abdominal pain, No Nausea or Vommitting, Bowel movements are regular, No Blood in stool or Urine, No dysuria, No new skin rashes or bruises, No new joints pains-aches, except back  pain as above No new weakness, tingling, numbness in any extremity, No recent weight gain or loss, No polyuria, polydypsia or polyphagia, No significant Mental Stressors.  A full 10 point Review of Systems was done, except as stated above, all other Review of Systems were negative.   With Past History of the following :    Past Medical History:  Diagnosis Date  . Anxiety   . Hypertension   . Overactive bladder   . Stroke [facility])       Past Surgical History:  Procedure Laterality Date  . HIP ADDUCTOR TENOTOMY    . JOINT REPLACEMENT        Social History:     Social History  Substance Use Topics  . Smoking status: Never Smoker  . Smokeless tobacco: Never Used  . Alcohol use No      Family History :   No history of CAD in the family at early age   Home Medications:   Prior to Admission medications   Medication Sig Start Date End Date Taking? Authorizing Provider  acetaminophen (TYLENOL) 500 MG tablet Take 1,000 mg by mouth at bedtime.    Yes Historical Provider, MD  amLODipine-benazepril (LOTREL) 10-20 MG per capsule Take 1 capsule by mouth daily.   Yes Historical Provider, MD  diazepam (VALIUM) 5 MG tablet Take 5 mg by mouth at bedtime.   Yes Historical Provider, MD  oxybutynin (DITROPAN-XL) 10 MG 24 hr tablet Take 10 mg by mouth daily.   Yes Historical Provider, MD     Allergies:     Allergies  Allergen Reactions  . Sulfa Antibiotics      Physical Exam:   Vitals  Blood pressure 142/71, pulse 87, temperature 98.3 F (36.8 C), temperature source Oral, resp. rate 17, height 5\' 2"  (1.575 m), weight 48.1 kg (106 lb), SpO2 95 %.   1. General Frail elderly white female lying in bed in mild back pain and nausea,  2. Normal affect and insight, Not Suicidal or Homicidal, Awake , pleasantly confused.  3. No F.N deficits, ALL C.Nerves Intact, Strength 5/5 all 4 extremities, Sensation intact all 4 extremities, Plantars down going.  4. Ears and Eyes appear  Normal, Conjunctivae clear, PERRLA. Moist Oral Mucosa.  5. Supple Neck, No JVD, No cervical lymphadenopathy appriciated, No Carotid Bruits.  6. Symmetrical Chest wall movement, Good air movement bilaterally, CTAB.  7. RRR, No Gallops, Rubs or Murmurs, No Parasternal Heave.  8. Positive Bowel Sounds, Abdomen Soft, No tenderness, No organomegaly appriciated,No rebound -guarding or rigidity.  9.  No Cyanosis, Normal Skin Turgor, No Skin Rash or Bruise.  10. Good muscle tone,  joints appear normal , no effusions, Normal ROM. Her back is diffusely tender,  11. No Palpable Lymph Nodes in Neck or Axillae      Data Review:    CBC  Recent Labs Lab 11/23/15 1356 11/23/15 1448  WBC 9.7  --   HGB 14.5 14.6  HCT 43.1 43.0  PLT 246  --   MCV 91.7  --   MCH 30.9  --   MCHC 33.6  --   RDW 13.2  --   LYMPHSABS 1.7  --   MONOABS 0.8  --   EOSABS 0.1  --   BASOSABS 0.0  --    ------------------------------------------------------------------------------------------------------------------  Chemistries   Recent Labs Lab 11/23/15 1448  NA 141  K 4.7  CL 104  GLUCOSE 102*  BUN 27*  CREATININE 1.00   ------------------------------------------------------------------------------------------------------------------ estimated creatinine clearance is 26.7 mL/min (by C-G formula based on SCr of 1 mg/dL). ------------------------------------------------------------------------------------------------------------------ No results for input(s): TSH, T4TOTAL, T3FREE, THYROIDAB in the last 72 hours.  Invalid input(s): FREET3  Coagulation profile No results for input(s): INR, PROTIME in the last 168 hours. ------------------------------------------------------------------------------------------------------------------- No results for input(s): DDIMER in the last 72  hours. -------------------------------------------------------------------------------------------------------------------  Cardiac Enzymes No results for input(s): CKMB, TROPONINI, MYOGLOBIN in the last 168 hours.  Invalid input(s): CK ------------------------------------------------------------------------------------------------------------------ No results found for: BNP   ---------------------------------------------------------------------------------------------------------------  Urinalysis    Component Value  Date/Time   COLORURINE YELLOW 11/23/2015 1022   APPEARANCEUR HAZY (A) 11/23/2015 1022   LABSPEC 1.010 11/23/2015 1022   PHURINE 8.0 11/23/2015 1022   GLUCOSEU NEGATIVE 11/23/2015 1022   HGBUR TRACE (A) 11/23/2015 1022   BILIRUBINUR NEGATIVE 11/23/2015 1022   KETONESUR NEGATIVE 11/23/2015 1022   PROTEINUR NEGATIVE 11/23/2015 1022   UROBILINOGEN 0.2 02/28/2012 1830   NITRITE POSITIVE (A) 11/23/2015 1022   LEUKOCYTESUR LARGE (A) 11/23/2015 1022    ----------------------------------------------------------------------------------------------------------------   Imaging Results:    Ct Renal Stone Study  Result Date: 11/23/2015 CLINICAL DATA:  Low back and left flank pain. EXAM: CT ABDOMEN AND PELVIS WITHOUT CONTRAST TECHNIQUE: Multidetector CT imaging of the abdomen and pelvis was performed following the standard protocol without IV contrast. COMPARISON:  None. FINDINGS: Lower chest: Mild left lower lobe atelectasis and minimal right lower lobe atelectasis or scarring. Hepatobiliary: Cholecystectomy clips.  Unremarkable liver. Pancreas: Unremarkable. No pancreatic ductal dilatation or surrounding inflammatory changes. Spleen: Normal in size without focal abnormality. Adrenals/Urinary Tract: 1.1 cm lower pole left renal calculus. Tiny mid left renal calculus or vascular calcification. Small exophytic hemorrhagic left renal cyst an more simple appearing exophytic upper pole  left renal cyst. Mild dilatation of the right renal collecting system to the level of the ureteropelvic junction. No obstructing stone or mass seen. Normal appearing adrenal glands, ureters and urinary bladder. Stomach/Bowel: Large number of sigmoid and distal descending colon diverticulum. Mildly prominent small bowel loops containing air-fluid levels in the pelvis. No evidence of appendicitis. Unremarkable stomach. Vascular/Lymphatic: Dense atheromatous arterial calcifications, including the coronary arteries, abdominal aorta of and origin of the right renal artery. Reproductive: Densely calcified uterine fibroids. No adnexal masses. Other: None. Musculoskeletal: Right hip prosthesis with associated artifacts. Diffuse osteopenia. 30% L2 vertebral body compression deformity with mild bony retropulsion. 10% L4 superior endplate compression deformity with mild bony retropulsion. 25% L5 superior endplate compression deformity with mild bony retropulsion. Facet degenerative changes with associated grade 1 anterolisthesis at the L5-S1 level. IMPRESSION: 1. 1.1 cm lower pole nonobstructing left renal calculus and additional tiny left renal calculus or vascular calcification. 2. 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 3. Extensive sigmoid and descending colon diverticulosis. 4. Dense coronary artery atherosclerosis and aortic atherosclerosis. 5. Diffuse osteopenia with multiple vertebral compression deformities with no acute fracture lines seen. 6. Mild mid small bowel ileus or partial obstruction. Electronically Signed   By: Claudie Revering M.D.   On: 11/23/2015 12:49        Assessment & Plan:      1. Back pain with radiation to both flanks. Questionable pyelonephritis in the ER, CT scan likely chronic changes. CT scan discussed with urologist on call Dr. Pilar Jarvis, no intervention needed. I'm not convinced that patient has pyelonephritis, I think her pain is musculoskeletal after moving furniture. For now we'll  cover her with Rocephin and blood cultures along with gentle IV fluids for hydration.  I will investigate her back pain further with L and T-spine CT scan along with CT scan of her right hip. Her age and is in the back radiating to both flanks but at times concentrates in the right hip. She has either sprained her back muscles during moving furniture or she has a pathological fracture which needs to be found. For now supportive care with pain control and PT.  2. UTI. Rocephin, follow cultures.  3. Early dementia. At risk for delirium. Haldol as needed, minimize narcotics and benzodiazepines. Daughter informed.  4. Possible small bowel obstruction  on CT scan. Currently mildly nauseated, keep her nothing by mouth, repeat x-ray in the morning, IV fluids and Zofran for now.    DVT Prophylaxis Heparin    AM Labs Ordered, also please review Full Orders  Family Communication: Admission, patients condition and plan of care including tests being ordered have been discussed with the patient and daughter who indicate understanding and agree with the plan and Code Status.  Code Status DNR  Likely DC to TBD  Condition GUARDED    Consults called: Urology Dr Pilar Jarvis over the phone    Admission status: Inpt    Time spent in minutes : 30   Ryenne Lynam K M.D on 11/23/2015 at 2:54 PM  Between 7am to 7pm - Pager - [phone]. After 7pm go to [email or web address] - password [facility]  [facility] - Office  [phone]

## 2015-11-23 NOTE — ED Notes (Signed)
Admitting doctor at bedside 

## 2015-11-23 NOTE — ED Notes (Signed)
Pt had episode of incontinence, pt bed linens changed and gown changed.  Pt placed into diaper with chucks beneath.  Pt was also cleaned up and patted dry with towel.  Pt repositioned in bed, family at bedside.  Pt alert and oriented, CT notified that pt is now ready to be taken for scan.

## 2015-11-23 NOTE — ED Provider Notes (Signed)
[facility] [facility] DEPT Provider Note   CSN: [id number] Arrival date & time: 11/23/15  0825  By signing my name below, I, Higinio Plan, attest that this documentation has been prepared under the direction and in the presence of Milton Ferguson, MD . Electronically Signed: Higinio Plan, Scribe. 11/23/2015. 10:17 AM.  History   Chief Complaint Chief Complaint  Patient presents with  . Back Pain   The history is provided by the patient. No language interpreter was used.  Back Pain   This is a new problem. The problem has been gradually worsening. The pain is associated with lifting heavy objects. The pain is present in the lumbar spine. The pain is at a severity of 8/10. Associated symptoms include abdominal pain. Pertinent negatives include no chest pain and no headaches.   HPI Comments: [name] is a 82 y.o. female with PMHx of overactive bladder, who presents to the Emergency Department for an evaluation of gradually worsening, 8/10, lower back pain. Pt reports her pain began soon after moving furniture around in her her home. She notes her pain is worse on her right side compared to her left and radiates into her abdomen. However, she believes her abdominal pain is due to her hx of overactive bladder. She notes her pain is exacerbated with movement but is relieved when lying still.   Past Medical History:  Diagnosis Date  . Anxiety   . Hypertension   . Overactive bladder   . Stroke [facility])     Patient Active Problem List   Diagnosis Date Noted  . Community acquired pneumonia 07/11/2014  . Urinary tract infection, site not specified 03/01/2012  . Acute delirium 03/01/2012  . Essential hypertension, benign 03/01/2012  . Hypokalemia 03/01/2012    Past Surgical History:  Procedure Laterality Date  . HIP ADDUCTOR TENOTOMY    . JOINT REPLACEMENT      OB History    No data available     Home Medications    Prior to Admission medications   Medication Sig Start Date End Date  Taking? Authorizing Provider  acetaminophen (TYLENOL) 500 MG tablet Take 1,000 mg by mouth at bedtime.    Yes Historical Provider, MD  amLODipine-benazepril (LOTREL) 10-20 MG per capsule Take 1 capsule by mouth daily.   Yes Historical Provider, MD  diazepam (VALIUM) 5 MG tablet Take 5 mg by mouth at bedtime.   Yes Historical Provider, MD  oxybutynin (DITROPAN-XL) 10 MG 24 hr tablet Take 10 mg by mouth daily.   Yes Historical Provider, MD    Family History History reviewed. No pertinent family history.  Social History Social History  Substance Use Topics  . Smoking status: Never Smoker  . Smokeless tobacco: Never Used  . Alcohol use No     Allergies   Sulfa antibiotics   Review of Systems Review of Systems  Constitutional: Negative for appetite change and fatigue.  HENT: Negative for congestion, ear discharge and sinus pressure.   Eyes: Negative for discharge.  Respiratory: Negative for cough.   Cardiovascular: Negative for chest pain.  Gastrointestinal: Positive for abdominal pain. Negative for diarrhea.  Genitourinary: Negative for frequency and hematuria.  Musculoskeletal: Positive for back pain.  Skin: Negative for rash.  Neurological: Negative for seizures and headaches.  Psychiatric/Behavioral: Negative for hallucinations.   Physical Exam Updated Vital Signs BP 135/69 (BP Location: Left Arm)   Pulse 94   Temp 98.4 F (36.9 C) (Oral)   Resp 18   Ht 5\' 2"  (1.575 m)  Wt 106 lb (48.1 kg)   SpO2 96%   BMI 19.39 kg/m   Physical Exam  Constitutional: She is oriented to person, place, and time. She appears well-developed.  HENT:  Head: Normocephalic.  Eyes: Conjunctivae and EOM are normal. No scleral icterus.  Neck: Neck supple. No thyromegaly present.  Cardiovascular: Normal rate and regular rhythm.  Exam reveals no gallop and no friction rub.   No murmur heard. Pulmonary/Chest: No stridor. She has no wheezes. She has no rales. She exhibits no tenderness.    Abdominal: She exhibits no distension. There is no tenderness. There is no rebound.  Musculoskeletal: Normal range of motion. She exhibits no edema.  Tenderness to her lumbar spine and right flank  Lymphadenopathy:    She has no cervical adenopathy.  Neurological: She is oriented to person, place, and time. She exhibits normal muscle tone. Coordination normal.  Skin: No rash noted. No erythema.  Psychiatric: She has a normal mood and affect. Her behavior is normal.   ED Treatments / Results  Labs (all labs ordered are listed, but only abnormal results are displayed) Labs Reviewed - No data to display  EKG  EKG Interpretation None       Radiology No results found.  Procedures Procedures (including critical care time)  Medications Ordered in ED Medications - No data to display  DIAGNOSTIC STUDIES:  Oxygen Saturation is 96% on RA, normal by my interpretation.    COORDINATION OF CARE:  10:15 AM Discussed treatment plan with pt at bedside and pt agreed to plan.  Initial Impression / Assessment and Plan / ED Course  I have reviewed the triage vital signs and the nursing notes.  Pertinent labs & imaging results that were available during my care of the patient were reviewed by me and considered in my medical decision making (see chart for details).  Clinical Course     Patient with urinary tract infection. Patient also with flank pain and dilatation of right collecting system. Patient will be admitted for IV antibiotics  I personally performed the services described in this documentation, which was scribed in my presence. The recorded information has been reviewed and is accurate.   Final Clinical Impressions(s) / ED Diagnoses   Final diagnoses:  None    New Prescriptions New Prescriptions   No medications on file  The chart was scribed for me under my direct supervision.  I personally performed the history, physical, and medical decision making and all  procedures in the evaluation of this patient.Milton Ferguson, MD 11/23/15 1440

## 2015-11-23 NOTE — ED Notes (Signed)
Pt had another episode of incontinence.  Diaper was changed and pt cleaned and patted dry.  Pt repositioned.  Dr. Roderic Palau at bedside speaking to pt and family.

## 2015-11-23 NOTE — ED Notes (Signed)
Pt has grimacing with movement but not with sitting still. Pt states daughter on way and is POA. Pt has walker with her.

## 2015-11-24 ENCOUNTER — Inpatient Hospital Stay (HOSPITAL_COMMUNITY): Payer: PPO

## 2015-11-24 DIAGNOSIS — R1110 Vomiting, unspecified: Secondary | ICD-10-CM | POA: Diagnosis not present

## 2015-11-24 DIAGNOSIS — N10 Acute tubulo-interstitial nephritis: Secondary | ICD-10-CM | POA: Diagnosis not present

## 2015-11-24 DIAGNOSIS — M4854XA Collapsed vertebra, not elsewhere classified, thoracic region, initial encounter for fracture: Secondary | ICD-10-CM | POA: Diagnosis not present

## 2015-11-24 LAB — CBC
HCT: 37.7 % (ref 36.0–46.0)
HEMOGLOBIN: 12 g/dL (ref 12.0–15.0)
MCH: 29.7 pg (ref 26.0–34.0)
MCHC: 31.8 g/dL (ref 30.0–36.0)
MCV: 93.3 fL (ref 78.0–100.0)
PLATELETS: 217 10*3/uL (ref 150–400)
RBC: 4.04 MIL/uL (ref 3.87–5.11)
RDW: 13.2 % (ref 11.5–15.5)
WBC: 7.4 10*3/uL (ref 4.0–10.5)

## 2015-11-24 LAB — BASIC METABOLIC PANEL
ANION GAP: 7 (ref 5–15)
BUN: 28 mg/dL — ABNORMAL HIGH (ref 6–20)
CALCIUM: 8.8 mg/dL — AB (ref 8.9–10.3)
CO2: 26 mmol/L (ref 22–32)
CREATININE: 1.16 mg/dL — AB (ref 0.44–1.00)
Chloride: 106 mmol/L (ref 101–111)
GFR calc Af Amer: 46 mL/min — ABNORMAL LOW (ref >60)
GFR, EST NON AFRICAN AMERICAN: 39 mL/min — AB (ref >60)
GLUCOSE: 95 mg/dL (ref 65–99)
Potassium: 4.3 mmol/L (ref 3.5–5.1)
Sodium: 139 mmol/L (ref 135–145)

## 2015-11-24 MED ORDER — DEXTROSE 5 % IV SOLN
1.0000 g | INTRAVENOUS | Status: DC
  Administered 2015-11-24: 1 g via INTRAVENOUS
  Filled 2015-11-24 (×3): qty 10

## 2015-11-24 NOTE — Care Management Note (Signed)
Case Management Note  Patient Details  Name: [name] MRN: [id number] Date of Birth: Jul 26, 1920  Subjective/Objective:                  Pt admitted with UTI and back pain. She is from home, lives alone and has friends who help take her to appointments and help her with shopping. PT has seen pt and recommends no PT follow up. Pt not interested in [facility] services. Pt's daughter states pt has friend who has offered to come in and stay with pt more often but pt has turned her down in the past. Pt has walker, at home PTA, she uses with mobility. She plans to return home with self care. Potential DC home in 24hrs.   Action/Plan: No CM needs.   Expected Discharge Date:      11/25/2015            Expected Discharge Plan:  Home/Self Care  In-House Referral:  NA  Discharge planning Services  CM Consult  Post Acute Care Choice:  NA Choice offered to:  NA  Status of Service:  Completed, signed off  Sherald Barge, RN 11/24/2015, 2:13 PM

## 2015-11-24 NOTE — Care Management CC44 (Signed)
Condition Code 44 Documentation Completed  Patient Details  Name: BRIELLAH BAIK MRN: [id number] Date of Birth: 1920/11/15   Condition Code 44 given:  Yes Patient signature on Condition Code 44 notice:  Yes Documentation of 2 MD's agreement:  Yes Code 44 added to claim:  Yes    Sherald Barge, RN 11/24/2015, 2:06 PM

## 2015-11-24 NOTE — Evaluation (Signed)
Physical Therapy Evaluation Patient Details Name: [name] MRN: [id number] DOB: 11-05-1920 Today's Date: 11/24/2015   History of Present Illness  82 y.o. female, With history of hypertension, mild early dementia, who lives alone walks with a walker comes in with one-week history of back pain which she is not too sure where it originates but points towards the flank area, unfortunately she at times says it's on the right side but but also at times says on the left, it's radiating to the front of her belly, she said a week ago she was trying to move some furniture and pain started at that time.   She came to the ER where workup suggested nonobstructing left renal calculus, mild partial right-sided UPJ stone, possible UTI.  Thoracic spnie CT shows T6 inferior endplate compression fracture with 30% loss of height of indeterminate age.    Clinical Impression  Pt received sitting up in the chair, and was agreeable to PT evaluation.  Pt lives alone, and is modified independent with gait with a RW, and modified independent with ADL's.  She has a friend who used to be a neighbor that helps her with driving to the grocery store, and helps her get to the doctor's office.  Pt is mostly household ambulator.  During Pt evaluation, she demonstrated sit<>stand transfer at Mod (I), and ambulated 47ft with RW at Mod (I) level.  She has had no falls in the past 6 months, however she does have a slow gait speed of 0.13ft/sec, which has been shown to be indicative of recurrent falls.  She did not demonstrate any instance of LOB during tx today.  Therefore, she is not recommended for further PT at this time.      Follow Up Recommendations No PT follow up    Equipment Recommendations  None recommended by PT    Recommendations for Other Services       Precautions / Restrictions Precautions Precautions: None Restrictions Weight Bearing Restrictions: No      Mobility  Bed Mobility Overal bed mobility:  (not  assessed, pt already sitting up in the chair upon arrival )                Transfers Overall transfer level: Modified independent Equipment used: Rolling walker (2 wheeled)                Ambulation/Gait Ambulation/Gait assistance: Modified independent (Device/Increase time) Ambulation Distance (Feet): 90 Feet Assistive device: Rolling walker (2 wheeled) Gait Pattern/deviations: WFL(Within Functional Limits) Gait velocity: 0.59ft/sec Gait velocity interpretation: <1.8 ft/sec, indicative of risk for recurrent falls General Gait Details: No LOB, but pt has difficulty with dual tasking, and occasionally has to stop gait to finish explaining things to the PT.  Pt with slow gait speed.    Stairs            Wheelchair Mobility    Modified Rankin (Stroke Patients Only)       Balance Overall balance assessment: Modified Independent                                           Pertinent Vitals/Pain Pain Assessment: No/denies pain    Home Living   Living Arrangements: Alone Available Help at Discharge: Neighbor;Available PRN/intermittently (Pt has MOW, and postal person brings the mail in to her house. ) Type of Home: House Home Access: Stairs to enter  Entrance Stairs-Number of Steps: 2 steps with HR.  Home Layout: One level Home Equipment: Tub bench;Hand held shower head;Walker - 2 wheels;Bedside commode      Prior Function Level of Independence: Independent with assistive device(s)   Gait / Transfers Assistance Needed: Pt ambulates with RW all the time, and is only a household ambulator.   ADL's / Homemaking Assistance Needed: Pt does not drive - neighbor/friend assists with groceries.        Hand Dominance   Dominant Hand: Right    Extremity/Trunk Assessment   Upper Extremity Assessment: Overall WFL for tasks assessed           Lower Extremity Assessment: Overall WFL for tasks assessed         Communication    Communication: No difficulties  Cognition Arousal/Alertness: Awake/alert Behavior During Therapy: WFL for tasks assessed/performed Overall Cognitive Status: Within Functional Limits for tasks assessed                      General Comments      Exercises     Assessment/Plan    PT Assessment Patent does not need any further PT services  PT Problem List            PT Treatment Interventions      PT Goals (Current goals can be found in the Care Plan section)  Acute Rehab PT Goals PT Goal Formulation: All assessment and education complete, DC therapy    Frequency     Barriers to discharge        Co-evaluation               End of Session Equipment Utilized During Treatment: Gait belt Activity Tolerance: Patient tolerated treatment well Patient left: in chair;with call bell/phone within reach Nurse Communication: Mobility status Jacqlyn Larsen, RN notified of pt's position, and mobiltiy status. )    Functional Assessment Tool Used: [employer] "6-clicks"  Functional Limitation: Mobility: Walking and moving around Mobility: Walking and Moving Around Current Status [phone]): At least 1 percent but less than 20 percent impaired, limited or restricted Mobility: Walking and Moving Around Goal Status [phone]): At least 1 percent but less than 20 percent impaired, limited or restricted Mobility: Walking and Moving Around Discharge Status [phone]): At least 1 percent but less than 20 percent impaired, limited or restricted    Time: 1034-1105 PT Time Calculation (min) (ACUTE ONLY): 31 min   Charges:   PT Evaluation $PT Eval Low Complexity: 1 Procedure PT Treatments $Gait Training: 8-22 mins   PT G Codes:   PT G-Codes **NOT FOR INPATIENT CLASS** Functional Assessment Tool Used: [employer] "6-clicks"  Functional Limitation: Mobility: Walking and moving around Mobility: Walking and Moving Around Current Status [phone]): At least 1 percent but  less than 20 percent impaired, limited or restricted Mobility: Walking and Moving Around Goal Status [phone]): At least 1 percent but less than 20 percent impaired, limited or restricted Mobility: Walking and Moving Around Discharge Status [phone]): At least 1 percent but less than 20 percent impaired, limited or restricted    Beth Laquashia Mergenthaler, PT, DPT X: [phone]

## 2015-11-24 NOTE — Progress Notes (Signed)
Nutrition Follow-up    INTERVENTION:  Heart healthy diet   NUTRITION DIAGNOSIS:   Increased protein needs related to advanced age; ongoing to help preserve muscle mass.  GOAL:  Pt to meet >/= 90% of their estimated nutrition needs      MONITOR:  Po intake, labs and wt trends     REASON FOR ASSESSMENT: Malnutrition Screen     ASSESSMENT:  Pt is a 82 yo female who lives alone. Hx includes HTN, early dementia. She presents with persistent back pain after trying to move furniture at home.  She denies recent changes in appetite or weight status and is able to feed herself. Home diet is regular. Her weight hx shows a loss 10# over the past 17 months which is not clinically significant for timeframe.     Nutrition-Focused physical exam findings are no fat depletion, no muscle depletion, and no edema.   Recent Labs Lab 11/23/15 1356 11/23/15 1448 11/24/15 0609  NA  --  141 139  K  --  4.7 4.3  CL  --  104 106  CO2  --   --  26  BUN  --  27* 28*  CREATININE 1.05* 1.00 1.16*  CALCIUM  --   --  8.8*  GLUCOSE  --  102* 95   Labs: BUN 28 and Cr 1.16  Diet Order:  Diet Heart Room service appropriate? Yes; Fluid consistency: Thin  Skin:   intact but dry   Last BM:   prior to admission  Height:   Ht Readings from Last 1 Encounters:  11/23/15 5\' 1"  (1.549 m)    Weight:   Wt Readings from Last 1 Encounters:  11/24/15 108 lb 14.5 oz (49.4 kg)    Ideal Body Weight:   48 kg  BMI:  Body mass index is 20.58 kg/m.  Estimated Nutritional Needs:   Kcal:   1225-1470   Protein:   58-65 gr  Fluid:   1.2-1.5 liter daily  EDUCATION NEEDS:   none identified  Colman Cater MS,RD,CSG,LDN Office: [phone] Pager: [phone]

## 2015-11-24 NOTE — Progress Notes (Signed)
Subjective: She was admitted with pain. It is not totally clear what this is from. She has age-indeterminate compression fractures in her spine. There was concern that she may have pyelonephritis. There was concern about small bowel obstruction which seems not to be the case.  Objective: Vital signs in last 24 hours: Temp:  [98.2 F (36.8 C)-98.8 F (37.1 C)] 98.2 F (36.8 C) (11/07 8315) Pulse Rate:  [74-100] 74 (11/07 0611) Resp:  [17-20] 17 (11/07 0611) BP: (95-153)/(51-72) 127/53 (11/07 0611) SpO2:  [93 %-100 %] 96 % (11/07 0611) Weight:  [49.1 kg (108 lb 3.9 oz)-49.4 kg (108 lb 14.5 oz)] 49.4 kg (108 lb 14.5 oz) (11/07 1761) Weight change:     Intake/Output from previous day: 11/06 0701 - 11/07 0700 In: 922.5 [I.V.:922.5] Out: 200 [Urine:200]  PHYSICAL EXAM General appearance: alert, moderate distress and Agitated very hard of hearing and mildly confused Resp: clear to auscultation bilaterally Cardio: regular rate and rhythm, S1, S2 normal, no murmur, click, rub or gallop GI: soft, non-tender; bowel sounds normal; no masses,  no organomegaly Extremities: extremities normal, atraumatic, no cyanosis or edema Skin warm and dry. Mucous membranes are moist  Lab Results:  Results for orders placed or performed during the hospital encounter of 11/23/15 (from the past 48 hour(s))  Urinalysis, Routine w reflex microscopic (not at [facility])     Status: Abnormal   Collection Time: 11/23/15 10:22 AM  Result Value Ref Range   Color, Urine YELLOW YELLOW   APPearance HAZY (A) CLEAR   Specific Gravity, Urine 1.010 1.005 - 1.030   pH 8.0 5.0 - 8.0   Glucose, UA NEGATIVE NEGATIVE mg/dL   Hgb urine dipstick TRACE (A) NEGATIVE   Bilirubin Urine NEGATIVE NEGATIVE   Ketones, ur NEGATIVE NEGATIVE mg/dL   Protein, ur NEGATIVE NEGATIVE mg/dL   Nitrite POSITIVE (A) NEGATIVE   Leukocytes, UA LARGE (A) NEGATIVE  Urine microscopic-add on     Status: Abnormal   Collection Time: 11/23/15 10:22 AM   Result Value Ref Range   Squamous Epithelial / LPF NONE SEEN NONE SEEN   WBC, UA TOO NUMEROUS TO COUNT 0 - 5 WBC/hpf   RBC / HPF 0-5 0 - 5 RBC/hpf   Bacteria, UA FEW (A) NONE SEEN  CBC with Differential/Platelet     Status: None   Collection Time: 11/23/15  1:56 PM  Result Value Ref Range   WBC 9.7 4.0 - 10.5 K/uL   RBC 4.70 3.87 - 5.11 MIL/uL   Hemoglobin 14.5 12.0 - 15.0 g/dL   HCT 43.1 36.0 - 46.0 %   MCV 91.7 78.0 - 100.0 fL   MCH 30.9 26.0 - 34.0 pg   MCHC 33.6 30.0 - 36.0 g/dL   RDW 13.2 11.5 - 15.5 %   Platelets 246 150 - 400 K/uL   Neutrophils Relative % 74 %   Neutro Abs 7.2 1.7 - 7.7 K/uL   Lymphocytes Relative 17 %   Lymphs Abs 1.7 0.7 - 4.0 K/uL   Monocytes Relative 8 %   Monocytes Absolute 0.8 0.1 - 1.0 K/uL   Eosinophils Relative 1 %   Eosinophils Absolute 0.1 0.0 - 0.7 K/uL   Basophils Relative 0 %   Basophils Absolute 0.0 0.0 - 0.1 K/uL  Creatinine, serum     Status: Abnormal   Collection Time: 11/23/15  1:56 PM  Result Value Ref Range   Creatinine, Ser 1.05 (H) 0.44 - 1.00 mg/dL   GFR calc non Af Amer 44 (L) >60  mL/min   GFR calc Af Amer 51 (L) >60 mL/min    Comment: (NOTE) The eGFR has been calculated using the CKD EPI equation. This calculation has not been validated in all clinical situations. eGFR's persistently <60 mL/min signify possible Chronic Kidney Disease.   Culture, blood (Routine X 2) w Reflex to ID Panel     Status: None (Preliminary result)   Collection Time: 11/23/15  2:39 PM  Result Value Ref Range   Specimen Description BLOOD BLOOD RIGHT ARM    Special Requests AEROBUC ONLY 6 CC EACH    Culture PENDING    Report Status PENDING   I-stat chem 8, ed     Status: Abnormal   Collection Time: 11/23/15  2:48 PM  Result Value Ref Range   Sodium 141 135 - 145 mmol/L   Potassium 4.7 3.5 - 5.1 mmol/L   Chloride 104 101 - 111 mmol/L   BUN 27 (H) 6 - 20 mg/dL   Creatinine, Ser 1.00 0.44 - 1.00 mg/dL   Glucose, Bld 102 (H) 65 - 99 mg/dL    Calcium, Ion 1.17 1.15 - 1.40 mmol/L   TCO2 27 0 - 100 mmol/L   Hemoglobin 14.6 12.0 - 15.0 g/dL   HCT 43.0 36.0 - 46.0 %  Culture, blood (Routine X 2) w Reflex to ID Panel     Status: None (Preliminary result)   Collection Time: 11/23/15  4:42 PM  Result Value Ref Range   Specimen Description BLOOD LEFT WRIST    Special Requests BOTTLES DRAWN AEROBIC AND ANAEROBIC 6CC    Culture PENDING    Report Status PENDING   Basic metabolic panel     Status: Abnormal   Collection Time: 11/24/15  6:09 AM  Result Value Ref Range   Sodium 139 135 - 145 mmol/L   Potassium 4.3 3.5 - 5.1 mmol/L   Chloride 106 101 - 111 mmol/L   CO2 26 22 - 32 mmol/L   Glucose, Bld 95 65 - 99 mg/dL   BUN 28 (H) 6 - 20 mg/dL   Creatinine, Ser 1.16 (H) 0.44 - 1.00 mg/dL   Calcium 8.8 (L) 8.9 - 10.3 mg/dL   GFR calc non Af Amer 39 (L) >60 mL/min   GFR calc Af Amer 46 (L) >60 mL/min    Comment: (NOTE) The eGFR has been calculated using the CKD EPI equation. This calculation has not been validated in all clinical situations. eGFR's persistently <60 mL/min signify possible Chronic Kidney Disease.    Anion gap 7 5 - 15  CBC     Status: None   Collection Time: 11/24/15  6:09 AM  Result Value Ref Range   WBC 7.4 4.0 - 10.5 K/uL   RBC 4.04 3.87 - 5.11 MIL/uL   Hemoglobin 12.0 12.0 - 15.0 g/dL   HCT 37.7 36.0 - 46.0 %   MCV 93.3 78.0 - 100.0 fL   MCH 29.7 26.0 - 34.0 pg   MCHC 31.8 30.0 - 36.0 g/dL   RDW 13.2 11.5 - 15.5 %   Platelets 217 150 - 400 K/uL    ABGS  Recent Labs  11/23/15 1448  TCO2 27   CULTURES Recent Results (from the past 240 hour(s))  Culture, blood (Routine X 2) w Reflex to ID Panel     Status: None (Preliminary result)   Collection Time: 11/23/15  2:39 PM  Result Value Ref Range Status   Specimen Description BLOOD BLOOD RIGHT ARM  Final   Special Requests [place]  ONLY 6 CC EACH  Final   Culture PENDING  Incomplete   Report Status PENDING  Incomplete  Culture, blood (Routine X 2) w  Reflex to ID Panel     Status: None (Preliminary result)   Collection Time: 11/23/15  4:42 PM  Result Value Ref Range Status   Specimen Description BLOOD LEFT WRIST  Final   Special Requests BOTTLES DRAWN AEROBIC AND ANAEROBIC 6CC  Final   Culture PENDING  Incomplete   Report Status PENDING  Incomplete   Studies/Results: Ct Thoracic Spine Wo Contrast  Result Date: 11/23/2015 CLINICAL DATA:  82 year old female moving furniture now complaining of lower back and left flank pain. Right hip pain. Initial encounter. EXAM: CT THORACIC SPINE WITHOUT CONTRAST TECHNIQUE: Multidetector CT imaging of the thoracic spine was performed without intravenous contrast administration. Multiplanar CT image reconstructions were also generated. COMPARISON:  12/04/2015 CT abdomen and pelvis. 02/28/2012 chest x-ray. FINDINGS: Alignment: Thoracic kyphosis. Present examination incorporates from the T1-2 disc space through the mid L3 level. Vertebrae: Remote appearing mild Schmorl's node deformity superior endplate T3 and T4. T6 inferior endplate compression fracture with 30% loss of height of indeterminate age. Remote small Schmorl's node deformity superior and inferior endplate T10. Remote mild superior endplate and moderate inferior endplate L2 compression deformity. Basilar atelectatic changes. Coronary artery calcifications. Ascending thoracic aorta measures up to 3.6 cm. Plaque thoracic aorta and great vessels. IMPRESSION: Thoracic kyphosis without significant spinal stenosis. T6 inferior endplate compression fracture with 30% loss of height of indeterminate age. Remote appearing mild Schmorl's node deformity superior endplate T3 and T4. Remote small Schmorl's node deformity superior and inferior endplate T10. Remote mild superior endplate and moderate inferior endplate L2 compression deformity. Aortic atherosclerosis.  Coronary artery calcifications. Bibasilar subsegmental atelectasis. Electronically Signed   By: Genia Del M.D.   On: 11/23/2015 17:11   Ct Hip Right Wo Contrast  Result Date: 11/23/2015 CLINICAL DATA:  Right hip pain which began while moving furniture today. EXAM: CT OF THE RIGHT HIP WITHOUT CONTRAST TECHNIQUE: Multidetector CT imaging of the right hip was performed according to the standard protocol. Multiplanar CT image reconstructions were also generated. COMPARISON:  Plain films right femur 12/06/2008. FINDINGS: Bones/Joint/Cartilage The patient has a right total hip arthroplasty. There is partial visualization of plate and screws along the lateral aspect of the right femur for fixation of a distal femur fracture which is seen on the comparison examination. The patient's hip is located. No acute bony or joint abnormality is seen. Bones are osteopenic. Ligaments Suboptimally assessed by CT. Muscles and Tendons Unremarkable in appearance. Soft tissues No acute abnormality is identified. Extensive sigmoid diverticulosis is seen. Atherosclerotic vascular disease is identified. Calcified uterine fibroids are also noted. IMPRESSION: No acute abnormality. Status post right hip replacement. Osteopenia. Atherosclerosis. Diverticulosis. Electronically Signed   By: Inge Rise M.D.   On: 11/23/2015 17:04   Ct L-spine No Charge  Result Date: 11/23/2015 CLINICAL DATA:  82 year old female with a history of flank pain EXAM: CT LUMBAR SPINE WITHOUT CONTRAST TECHNIQUE: Multidetector CT imaging of the lumbar spine was reformatted from CT performed as a stone protocol. Multiplanar CT image reconstructions were also generated. COMPARISON:  CT of the abdomen 11/23/2015, plain film 05/14/2007 FINDINGS: Segmentation: 5 lumbar vertebral bodies. Alignment: Grade 1 anterolisthesis of L5 on S1. No pars defect. No evidence of additional levels of anterolisthesis or retrolisthesis. No subluxation. Vertebrae: Diffuse osteopenia somewhat limiting evaluation for nondisplaced fractures. Less than 30% height loss at the anterior  aspect  of L2 vertebral body as well as at the anterior aspect of the L5 vertebral body. Prior plain film dated 2009 demonstrates unchanged configuration at L5. No comparison of L2. No fracture line identified. Paraspinal and other soft tissues: Unremarkable appearance of the paraspinal soft tissues. Left-sided nephrolithiasis, demonstrated on today's CT. Diverticulosis demonstrated on today's CT. Disc levels: L1-L2: No bony canal narrowing. Mild broad-based disc bulge. No significant foraminal narrowing. L2-L3: No bony canal narrowing. Mild broad-based disc bulge. No significant neural foraminal narrowing. L3-L4: No bony canal narrowing. Broad-based disc bulge and ligamentum flavum thickening, without significant soft tissue canal narrowing. No significant neural foraminal narrowing. L4-L5: Broad-based disc bulge. Ligamentum flavum thickening. No neural foraminal narrowing. L5-S1: Grade 1 anterolisthesis with un coverage of the posterior aspect of the disc. Disc space narrowing. Moderate left greater than right neural foraminal narrowing secondary to focal disc protrusion, anterolisthesis, and loss of disc height. IMPRESSION: No acute fracture identified, however, diffuse osteopenia somewhat limits evaluation for nondisplaced fractures. L5 vertebral body is unchanged from the plain film of 2009, with less than 30% anterior height loss. Wedge deformity of L2, without comparison to determine acuity. If there is point tenderness at the L2 level with concern for fracture, MRI may be considered. Osteopenia. Grade 1 anterolisthesis of L5 on S1, with moderate left greater than right neural foraminal narrowing. Left-sided nephrolithiasis, demonstrated on the source images/abdomen pelvis CT. Signed, Dulcy Fanny. Earleen Newport, DO [facility] Electronically Signed   By: Corrie Mckusick D.O.   On: 11/23/2015 15:50   Dg Abd Portable 1v  Result Date: 11/24/2015 CLINICAL DATA:   Nausea and vomiting. Confusion and disorientation. History of stroke. EXAM: PORTABLE ABDOMEN - 1 VIEW COMPARISON:  05/14/2007 FINDINGS: Scattered gas and stool throughout the colon. No small or large bowel distention. No radiopaque stones. Radiopaque density in the left mid abdomen likely represents ingested opaque medication. Calcifications in the pelvis consistent with uterine fibroids. Degenerative changes in the spine. Postoperative change in the right hip. Surgical clips in the right upper quadrant. IMPRESSION: Nonobstructive bowel gas pattern. Electronically Signed   By: Lucienne Capers M.D.   On: 11/24/2015 05:37   Ct Renal Stone Study  Result Date: 11/23/2015 CLINICAL DATA:  Low back and left flank pain. EXAM: CT ABDOMEN AND PELVIS WITHOUT CONTRAST TECHNIQUE: Multidetector CT imaging of the abdomen and pelvis was performed following the standard protocol without IV contrast. COMPARISON:  None. FINDINGS: Lower chest: Mild left lower lobe atelectasis and minimal right lower lobe atelectasis or scarring. Hepatobiliary: Cholecystectomy clips.  Unremarkable liver. Pancreas: Unremarkable. No pancreatic ductal dilatation or surrounding inflammatory changes. Spleen: Normal in size without focal abnormality. Adrenals/Urinary Tract: 1.1 cm lower pole left renal calculus. Tiny mid left renal calculus or vascular calcification. Small exophytic hemorrhagic left renal cyst an more simple appearing exophytic upper pole left renal cyst. Mild dilatation of the right renal collecting system to the level of the ureteropelvic junction. No obstructing stone or mass seen. Normal appearing adrenal glands, ureters and urinary bladder. Stomach/Bowel: Large number of sigmoid and distal descending colon diverticulum. Mildly prominent small bowel loops containing air-fluid levels in the pelvis. No evidence of appendicitis. Unremarkable stomach. Vascular/Lymphatic: Dense atheromatous arterial calcifications, including the coronary  arteries, abdominal aorta of and origin of the right renal artery. Reproductive: Densely calcified uterine fibroids. No adnexal masses. Other: None. Musculoskeletal: Right hip prosthesis with associated artifacts. Diffuse osteopenia. 30% L2 vertebral body compression deformity with mild bony retropulsion. 10% L4 superior endplate  compression deformity with mild bony retropulsion. 25% L5 superior endplate compression deformity with mild bony retropulsion. Facet degenerative changes with associated grade 1 anterolisthesis at the L5-S1 level. IMPRESSION: 1. 1.1 cm lower pole nonobstructing left renal calculus and additional tiny left renal calculus or vascular calcification. 2. 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 3. Extensive sigmoid and descending colon diverticulosis. 4. Dense coronary artery atherosclerosis and aortic atherosclerosis. 5. Diffuse osteopenia with multiple vertebral compression deformities with no acute fracture lines seen. 6. Mild mid small bowel ileus or partial obstruction. Electronically Signed   By: Claudie Revering M.D.   On: 11/23/2015 12:49    Medications:  Prior to Admission:  Prescriptions Prior to Admission  Medication Sig Dispense Refill Last Dose  . acetaminophen (TYLENOL) 500 MG tablet Take 1,000 mg by mouth at bedtime.    11/22/2015 at Unknown time  . amLODipine-benazepril (LOTREL) 10-20 MG per capsule Take 1 capsule by mouth daily.   11/22/2015 at Unknown time  . diazepam (VALIUM) 5 MG tablet Take 5 mg by mouth at bedtime.   11/22/2015 at Unknown time  . oxybutynin (DITROPAN-XL) 10 MG 24 hr tablet Take 10 mg by mouth daily.   11/22/2015 at Unknown time   Scheduled: . acetaminophen  1,000 mg Oral QHS  . cefTRIAXone (ROCEPHIN)  IV  1 g Intravenous Q24H  . diazepam  5 mg Oral QHS  . heparin  5,000 Units Subcutaneous Q8H  . tamsulosin  0.4 mg Oral Daily   Continuous: . sodium chloride 75 mL/hr at 11/24/15 0542   OYW:[id number], haloperidol lactate,  HYDROcodone-acetaminophen, ondansetron (ZOFRAN) IV  Assesment: She was admitted with pyelonephritis but is not clear that that is the diagnosis. She does have compression fractures in the spine but they're not of a determined age. It's hard to assess her pain because of her difficulty hearing. She does not seem to have a small bowel obstructions are think we can put her on a diet now. Principal Problem:   Pyelonephritis, acute Active Problems:   Essential hypertension, benign   Hypokalemia   UTI (urinary tract infection)   Pyelonephritis    Plan: Advance her diet. Work on pain control. She may need MRI and perhaps kyphoplasty. See how she does first    LOS: 1 day   Maurico Perrell L 11/24/2015, 8:44 AM

## 2015-11-25 DIAGNOSIS — I739 Peripheral vascular disease, unspecified: Secondary | ICD-10-CM | POA: Diagnosis present

## 2015-11-25 DIAGNOSIS — N10 Acute tubulo-interstitial nephritis: Secondary | ICD-10-CM | POA: Diagnosis not present

## 2015-11-25 DIAGNOSIS — M810 Age-related osteoporosis without current pathological fracture: Secondary | ICD-10-CM | POA: Diagnosis present

## 2015-11-25 DIAGNOSIS — M4850XA Collapsed vertebra, not elsewhere classified, site unspecified, initial encounter for fracture: Secondary | ICD-10-CM | POA: Diagnosis present

## 2015-11-25 DIAGNOSIS — M4854XA Collapsed vertebra, not elsewhere classified, thoracic region, initial encounter for fracture: Secondary | ICD-10-CM | POA: Diagnosis not present

## 2015-11-25 DIAGNOSIS — S32020A Wedge compression fracture of second lumbar vertebra, initial encounter for closed fracture: Secondary | ICD-10-CM | POA: Diagnosis present

## 2015-11-25 MED ORDER — CEFUROXIME AXETIL 250 MG PO TABS: 250.0000 mg | ORAL_TABLET | Freq: Two times a day (BID) | ORAL | 0 refills | Status: DC

## 2015-11-25 MED ORDER — ALENDRONATE SODIUM 70 MG PO TABS: 70.0000 mg | ORAL_TABLET | ORAL | 12 refills | Status: DC

## 2015-11-25 MED ORDER — CEFUROXIME AXETIL 250 MG PO TABS
250.0000 mg | ORAL_TABLET | Freq: Two times a day (BID) | ORAL | Status: DC
  Administered 2015-11-25: 250 mg via ORAL
  Filled 2015-11-25: qty 1

## 2015-11-25 NOTE — Care Management Note (Addendum)
Case Management Note  Patient Details  Name: [name] MRN: [id number] Date of Birth: 1920-12-10  Expected Discharge Date:    11/25/2015              Expected Discharge Plan:  [place]  In-House Referral:  NA  Discharge planning Services  CM Consult  Post Acute Care Choice:  Home Health Choice offered to:  Patient  HH Arranged:  RN, PT [facility] Agency:  [place]  Status of Service:  Completed, signed off   Additional Comments: Pt discharging home today. MD has ordered [facility] RN and PT. Pt is now agreeable and has requested [facility] be sent the referral. Pt is aware [place] has 48hrs to make their first visit. Romualdo Bolk, of [facility], is aware of referral and will obtain pt info from chart. Pt's daughter coming into town today and will provide transport home.   Sherald Barge, RN 11/25/2015, 11:41 AM

## 2015-11-25 NOTE — Progress Notes (Signed)
Patient with orders to be discharge. Discharge instructions reviewed, patient and daughter verbalized understanding. Patient stable. Patient left in private vehicle with daughter.

## 2015-11-25 NOTE — Progress Notes (Signed)
Subjective: She says she feels better today. She was able to stand up yesterday. Her hearing is better. She's not confused. She did not have a urine culture but blood cultures are negative so far. She has not had any fever. She complains of back pain and complains of pain diffusely which she says is chronic.  Objective: Vital signs in last 24 hours: Temp:  [97.9 F (36.6 C)-98.4 F (36.9 C)] 97.9 F (36.6 C) (11/08 0500) Pulse Rate:  [72-84] 84 (11/08 0500) Resp:  [18-20] 20 (11/08 0500) BP: (110-155)/(58-72) 110/72 (11/08 0500) SpO2:  [94 %-96 %] 95 % (11/08 0500) Weight:  [48.8 kg (107 lb 9.4 oz)] 48.8 kg (107 lb 9.4 oz) (11/08 0500) Weight change: 0.719 kg (1 lb 9.4 oz)    Intake/Output from previous day: 11/07 0701 - 11/08 0700 In: 1041.3 [P.O.:240; I.V.:751.3; IV Piggyback:50] Out: 100 [Urine:100]  PHYSICAL EXAM General appearance: alert, cooperative, mild distress and Very hard of hearing but better than yesterday Resp: clear to auscultation bilaterally Cardio: regular rate and rhythm, S1, S2 normal, no murmur, click, rub or gallop GI: soft, non-tender; bowel sounds normal; no masses,  no organomegaly Extremities: extremities normal, atraumatic, no cyanosis or edema Skin warm and dry. Mucous membranes are moist  Lab Results:  Results for orders placed or performed during the hospital encounter of 11/23/15 (from the past 48 hour(s))  Urinalysis, Routine w reflex microscopic (not at [facility])     Status: Abnormal   Collection Time: 11/23/15 10:22 AM  Result Value Ref Range   Color, Urine YELLOW YELLOW   APPearance HAZY (A) CLEAR   Specific Gravity, Urine 1.010 1.005 - 1.030   pH 8.0 5.0 - 8.0   Glucose, UA NEGATIVE NEGATIVE mg/dL   Hgb urine dipstick TRACE (A) NEGATIVE   Bilirubin Urine NEGATIVE NEGATIVE   Ketones, ur NEGATIVE NEGATIVE mg/dL   Protein, ur NEGATIVE NEGATIVE mg/dL   Nitrite POSITIVE (A) NEGATIVE   Leukocytes, UA LARGE (A) NEGATIVE  Urine microscopic-add  on     Status: Abnormal   Collection Time: 11/23/15 10:22 AM  Result Value Ref Range   Squamous Epithelial / LPF NONE SEEN NONE SEEN   WBC, UA TOO NUMEROUS TO COUNT 0 - 5 WBC/hpf   RBC / HPF 0-5 0 - 5 RBC/hpf   Bacteria, UA FEW (A) NONE SEEN  CBC with Differential/Platelet     Status: None   Collection Time: 11/23/15  1:56 PM  Result Value Ref Range   WBC 9.7 4.0 - 10.5 K/uL   RBC 4.70 3.87 - 5.11 MIL/uL   Hemoglobin 14.5 12.0 - 15.0 g/dL   HCT 43.1 36.0 - 46.0 %   MCV 91.7 78.0 - 100.0 fL   MCH 30.9 26.0 - 34.0 pg   MCHC 33.6 30.0 - 36.0 g/dL   RDW 13.2 11.5 - 15.5 %   Platelets 246 150 - 400 K/uL   Neutrophils Relative % 74 %   Neutro Abs 7.2 1.7 - 7.7 K/uL   Lymphocytes Relative 17 %   Lymphs Abs 1.7 0.7 - 4.0 K/uL   Monocytes Relative 8 %   Monocytes Absolute 0.8 0.1 - 1.0 K/uL   Eosinophils Relative 1 %   Eosinophils Absolute 0.1 0.0 - 0.7 K/uL   Basophils Relative 0 %   Basophils Absolute 0.0 0.0 - 0.1 K/uL  Creatinine, serum     Status: Abnormal   Collection Time: 11/23/15  1:56 PM  Result Value Ref Range   Creatinine, Ser 1.05 (  H) 0.44 - 1.00 mg/dL   GFR calc non Af Amer 44 (L) >60 mL/min   GFR calc Af Amer 51 (L) >60 mL/min    Comment: (NOTE) The eGFR has been calculated using the CKD EPI equation. This calculation has not been validated in all clinical situations. eGFR's persistently <60 mL/min signify possible Chronic Kidney Disease.   Culture, blood (Routine X 2) w Reflex to ID Panel     Status: None (Preliminary result)   Collection Time: 11/23/15  2:39 PM  Result Value Ref Range   Specimen Description BLOOD BLOOD RIGHT ARM    Special Requests AEROBUC ONLY 6 CC EACH    Culture NO GROWTH 2 DAYS    Report Status PENDING   I-stat chem 8, ed     Status: Abnormal   Collection Time: 11/23/15  2:48 PM  Result Value Ref Range   Sodium 141 135 - 145 mmol/L   Potassium 4.7 3.5 - 5.1 mmol/L   Chloride 104 101 - 111 mmol/L   BUN 27 (H) 6 - 20 mg/dL    Creatinine, Ser 1.00 0.44 - 1.00 mg/dL   Glucose, Bld 102 (H) 65 - 99 mg/dL   Calcium, Ion 1.17 1.15 - 1.40 mmol/L   TCO2 27 0 - 100 mmol/L   Hemoglobin 14.6 12.0 - 15.0 g/dL   HCT 43.0 36.0 - 46.0 %  Culture, blood (Routine X 2) w Reflex to ID Panel     Status: None (Preliminary result)   Collection Time: 11/23/15  4:42 PM  Result Value Ref Range   Specimen Description BLOOD LEFT WRIST    Special Requests BOTTLES DRAWN AEROBIC AND ANAEROBIC 6CC    Culture NO GROWTH 2 DAYS    Report Status PENDING   Basic metabolic panel     Status: Abnormal   Collection Time: 11/24/15  6:09 AM  Result Value Ref Range   Sodium 139 135 - 145 mmol/L   Potassium 4.3 3.5 - 5.1 mmol/L   Chloride 106 101 - 111 mmol/L   CO2 26 22 - 32 mmol/L   Glucose, Bld 95 65 - 99 mg/dL   BUN 28 (H) 6 - 20 mg/dL   Creatinine, Ser 1.16 (H) 0.44 - 1.00 mg/dL   Calcium 8.8 (L) 8.9 - 10.3 mg/dL   GFR calc non Af Amer 39 (L) >60 mL/min   GFR calc Af Amer 46 (L) >60 mL/min    Comment: (NOTE) The eGFR has been calculated using the CKD EPI equation. This calculation has not been validated in all clinical situations. eGFR's persistently <60 mL/min signify possible Chronic Kidney Disease.    Anion gap 7 5 - 15  CBC     Status: None   Collection Time: 11/24/15  6:09 AM  Result Value Ref Range   WBC 7.4 4.0 - 10.5 K/uL   RBC 4.04 3.87 - 5.11 MIL/uL   Hemoglobin 12.0 12.0 - 15.0 g/dL   HCT 37.7 36.0 - 46.0 %   MCV 93.3 78.0 - 100.0 fL   MCH 29.7 26.0 - 34.0 pg   MCHC 31.8 30.0 - 36.0 g/dL   RDW 13.2 11.5 - 15.5 %   Platelets 217 150 - 400 K/uL    ABGS  Recent Labs  11/23/15 1448  TCO2 27   CULTURES Recent Results (from the past 240 hour(s))  Culture, blood (Routine X 2) w Reflex to ID Panel     Status: None (Preliminary result)   Collection Time: 11/23/15  2:39 PM  Result Value Ref Range Status   Specimen Description BLOOD BLOOD RIGHT ARM  Final   Special Requests AEROBUC ONLY 6 CC EACH  Final   Culture NO  GROWTH 2 DAYS  Final   Report Status PENDING  Incomplete  Culture, blood (Routine X 2) w Reflex to ID Panel     Status: None (Preliminary result)   Collection Time: 11/23/15  4:42 PM  Result Value Ref Range Status   Specimen Description BLOOD LEFT WRIST  Final   Special Requests BOTTLES DRAWN AEROBIC AND ANAEROBIC 6CC  Final   Culture NO GROWTH 2 DAYS  Final   Report Status PENDING  Incomplete   Studies/Results: Ct Thoracic Spine Wo Contrast  Result Date: 11/23/2015 CLINICAL DATA:  82 year old female moving furniture now complaining of lower back and left flank pain. Right hip pain. Initial encounter. EXAM: CT THORACIC SPINE WITHOUT CONTRAST TECHNIQUE: Multidetector CT imaging of the thoracic spine was performed without intravenous contrast administration. Multiplanar CT image reconstructions were also generated. COMPARISON:  12/04/2015 CT abdomen and pelvis. 02/28/2012 chest x-ray. FINDINGS: Alignment: Thoracic kyphosis. Present examination incorporates from the T1-2 disc space through the mid L3 level. Vertebrae: Remote appearing mild Schmorl's node deformity superior endplate T3 and T4. T6 inferior endplate compression fracture with 30% loss of height of indeterminate age. Remote small Schmorl's node deformity superior and inferior endplate T10. Remote mild superior endplate and moderate inferior endplate L2 compression deformity. Basilar atelectatic changes. Coronary artery calcifications. Ascending thoracic aorta measures up to 3.6 cm. Plaque thoracic aorta and great vessels. IMPRESSION: Thoracic kyphosis without significant spinal stenosis. T6 inferior endplate compression fracture with 30% loss of height of indeterminate age. Remote appearing mild Schmorl's node deformity superior endplate T3 and T4. Remote small Schmorl's node deformity superior and inferior endplate T10. Remote mild superior endplate and moderate inferior endplate L2 compression deformity. Aortic atherosclerosis.  Coronary  artery calcifications. Bibasilar subsegmental atelectasis. Electronically Signed   By: Genia Del M.D.   On: 11/23/2015 17:11   Ct Hip Right Wo Contrast  Result Date: 11/23/2015 CLINICAL DATA:  Right hip pain which began while moving furniture today. EXAM: CT OF THE RIGHT HIP WITHOUT CONTRAST TECHNIQUE: Multidetector CT imaging of the right hip was performed according to the standard protocol. Multiplanar CT image reconstructions were also generated. COMPARISON:  Plain films right femur 12/06/2008. FINDINGS: Bones/Joint/Cartilage The patient has a right total hip arthroplasty. There is partial visualization of plate and screws along the lateral aspect of the right femur for fixation of a distal femur fracture which is seen on the comparison examination. The patient's hip is located. No acute bony or joint abnormality is seen. Bones are osteopenic. Ligaments Suboptimally assessed by CT. Muscles and Tendons Unremarkable in appearance. Soft tissues No acute abnormality is identified. Extensive sigmoid diverticulosis is seen. Atherosclerotic vascular disease is identified. Calcified uterine fibroids are also noted. IMPRESSION: No acute abnormality. Status post right hip replacement. Osteopenia. Atherosclerosis. Diverticulosis. Electronically Signed   By: Inge Rise M.D.   On: 11/23/2015 17:04   Ct L-spine No Charge  Result Date: 11/23/2015 CLINICAL DATA:  82 year old female with a history of flank pain EXAM: CT LUMBAR SPINE WITHOUT CONTRAST TECHNIQUE: Multidetector CT imaging of the lumbar spine was reformatted from CT performed as a stone protocol. Multiplanar CT image reconstructions were also generated. COMPARISON:  CT of the abdomen 11/23/2015, plain film 05/14/2007 FINDINGS: Segmentation: 5 lumbar vertebral bodies. Alignment: Grade 1 anterolisthesis of L5 on S1. No pars defect. No evidence of additional  levels of anterolisthesis or retrolisthesis. No subluxation. Vertebrae: Diffuse osteopenia  somewhat limiting evaluation for nondisplaced fractures. Less than 30% height loss at the anterior aspect of L2 vertebral body as well as at the anterior aspect of the L5 vertebral body. Prior plain film dated 2009 demonstrates unchanged configuration at L5. No comparison of L2. No fracture line identified. Paraspinal and other soft tissues: Unremarkable appearance of the paraspinal soft tissues. Left-sided nephrolithiasis, demonstrated on today's CT. Diverticulosis demonstrated on today's CT. Disc levels: L1-L2: No bony canal narrowing. Mild broad-based disc bulge. No significant foraminal narrowing. L2-L3: No bony canal narrowing. Mild broad-based disc bulge. No significant neural foraminal narrowing. L3-L4: No bony canal narrowing. Broad-based disc bulge and ligamentum flavum thickening, without significant soft tissue canal narrowing. No significant neural foraminal narrowing. L4-L5: Broad-based disc bulge. Ligamentum flavum thickening. No neural foraminal narrowing. L5-S1: Grade 1 anterolisthesis with un coverage of the posterior aspect of the disc. Disc space narrowing. Moderate left greater than right neural foraminal narrowing secondary to focal disc protrusion, anterolisthesis, and loss of disc height. IMPRESSION: No acute fracture identified, however, diffuse osteopenia somewhat limits evaluation for nondisplaced fractures. L5 vertebral body is unchanged from the plain film of 2009, with less than 30% anterior height loss. Wedge deformity of L2, without comparison to determine acuity. If there is point tenderness at the L2 level with concern for fracture, MRI may be considered. Osteopenia. Grade 1 anterolisthesis of L5 on S1, with moderate left greater than right neural foraminal narrowing. Left-sided nephrolithiasis, demonstrated on the source images/abdomen pelvis CT. Signed, Dulcy Fanny. Earleen Newport, DO [facility] Electronically Signed   By: Corrie Mckusick D.O.   On: 11/23/2015 15:50   Dg Abd Portable 1v  Result Date: 11/24/2015 CLINICAL DATA:  Nausea and vomiting. Confusion and disorientation. History of stroke. EXAM: PORTABLE ABDOMEN - 1 VIEW COMPARISON:  05/14/2007 FINDINGS: Scattered gas and stool throughout the colon. No small or large bowel distention. No radiopaque stones. Radiopaque density in the left mid abdomen likely represents ingested opaque medication. Calcifications in the pelvis consistent with uterine fibroids. Degenerative changes in the spine. Postoperative change in the right hip. Surgical clips in the right upper quadrant. IMPRESSION: Nonobstructive bowel gas pattern. Electronically Signed   By: Lucienne Capers M.D.   On: 11/24/2015 05:37   Ct Renal Stone Study  Result Date: 11/23/2015 CLINICAL DATA:  Low back and left flank pain. EXAM: CT ABDOMEN AND PELVIS WITHOUT CONTRAST TECHNIQUE: Multidetector CT imaging of the abdomen and pelvis was performed following the standard protocol without IV contrast. COMPARISON:  None. FINDINGS: Lower chest: Mild left lower lobe atelectasis and minimal right lower lobe atelectasis or scarring. Hepatobiliary: Cholecystectomy clips.  Unremarkable liver. Pancreas: Unremarkable. No pancreatic ductal dilatation or surrounding inflammatory changes. Spleen: Normal in size without focal abnormality. Adrenals/Urinary Tract: 1.1 cm lower pole left renal calculus. Tiny mid left renal calculus or vascular calcification. Small exophytic hemorrhagic left renal cyst an more simple appearing exophytic upper pole left renal cyst. Mild dilatation of the right renal collecting system to the level of the ureteropelvic junction. No obstructing stone or mass seen. Normal appearing adrenal glands, ureters and urinary bladder. Stomach/Bowel: Large number of sigmoid and distal descending colon diverticulum. Mildly prominent small bowel loops containing air-fluid levels in the pelvis. No evidence of appendicitis.  Unremarkable stomach. Vascular/Lymphatic: Dense atheromatous arterial calcifications, including the coronary arteries, abdominal aorta of and origin of the right renal artery. Reproductive: Densely calcified uterine fibroids. No adnexal  masses. Other: None. Musculoskeletal: Right hip prosthesis with associated artifacts. Diffuse osteopenia. 30% L2 vertebral body compression deformity with mild bony retropulsion. 10% L4 superior endplate compression deformity with mild bony retropulsion. 25% L5 superior endplate compression deformity with mild bony retropulsion. Facet degenerative changes with associated grade 1 anterolisthesis at the L5-S1 level. IMPRESSION: 1. 1.1 cm lower pole nonobstructing left renal calculus and additional tiny left renal calculus or vascular calcification. 2. 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 3. Extensive sigmoid and descending colon diverticulosis. 4. Dense coronary artery atherosclerosis and aortic atherosclerosis. 5. Diffuse osteopenia with multiple vertebral compression deformities with no acute fracture lines seen. 6. Mild mid small bowel ileus or partial obstruction. Electronically Signed   By: Claudie Revering M.D.   On: 11/23/2015 12:49    Medications:  Prior to Admission:  Prescriptions Prior to Admission  Medication Sig Dispense Refill Last Dose  . acetaminophen (TYLENOL) 500 MG tablet Take 1,000 mg by mouth at bedtime.    11/22/2015 at Unknown time  . amLODipine-benazepril (LOTREL) 10-20 MG per capsule Take 1 capsule by mouth daily.   11/22/2015 at Unknown time  . diazepam (VALIUM) 5 MG tablet Take 5 mg by mouth at bedtime.   11/22/2015 at Unknown time  . oxybutynin (DITROPAN-XL) 10 MG 24 hr tablet Take 10 mg by mouth daily.   11/22/2015 at Unknown time   Scheduled: . acetaminophen  1,000 mg Oral QHS  . cefUROXime  250 mg Oral BID WC  . diazepam  5 mg Oral QHS  . heparin  5,000 Units Subcutaneous Q8H  . tamsulosin  0.4 mg Oral Daily    Continuous:  [facility]:ENIDPOEUM, haloperidol lactate, HYDROcodone-acetaminophen, ondansetron (ZOFRAN) IV  Assesment: She was admitted with back pain. There is some question of pyelonephritis but she's not had any fever. Blood cultures are negative so far and she's been afebrile. No urine culture was obtained. She has compression fracture in her thoracic and lumbar spine which may be the etiology of her back pain although it's not possible to be sure when that happened. She has osteoporosis. CT shows atherosclerosis in her aorta so she has peripheral arterial disease as well which is a new diagnosis Principal Problem:   Pyelonephritis, acute Active Problems:   Essential hypertension, benign   Hypokalemia   UTI (urinary tract infection)   Pyelonephritis   Compression fracture of L2 lumbar vertebra (HCC)   Compression fracture of spine, non-traumatic (HCC)   Osteoporosis   Peripheral arterial disease ([place])    Plan: I will ask PT to see. Probable discharge later today.    LOS: 1 day   Hamda Klutts L 11/25/2015, 8:25 AM

## 2015-11-25 NOTE — Discharge Summary (Signed)
Physician Discharge Summary  Patient ID: [name] MRN: [id number] DOB/AGE: 1920-05-27 82 y.o. Primary Care Physician:Treyshawn Muldrew L, MD Admit date: 11/23/2015 Discharge date: 11/25/2015    Discharge Diagnoses:   Principal Problem:   Pyelonephritis, acute Active Problems:   Essential hypertension, benign   Hypokalemia   UTI (urinary tract infection)   Pyelonephritis   Compression fracture of L2 lumbar vertebra (HCC)   Compression fracture of spine, non-traumatic (HCC)   Osteoporosis   Peripheral arterial disease (HCC)     Medication List    TAKE these medications   acetaminophen 500 MG tablet Commonly known as:  TYLENOL Take 1,000 mg by mouth at bedtime.   alendronate 70 MG tablet Commonly known as:  FOSAMAX Take 1 tablet (70 mg total) by mouth once a week. Take with a full glass of water on an empty stomach.   amLODipine-benazepril 10-20 MG capsule Commonly known as:  LOTREL Take 1 capsule by mouth daily.   cefUROXime 250 MG tablet Commonly known as:  CEFTIN Take 1 tablet (250 mg total) by mouth 2 (two) times daily with a meal.   diazepam 5 MG tablet Commonly known as:  VALIUM Take 5 mg by mouth at bedtime.   oxybutynin 10 MG 24 hr tablet Commonly known as:  DITROPAN-XL Take 10 mg by mouth daily.       Discharged Condition:Improved    Consults: Physical therapy  Significant Diagnostic Studies: Ct Thoracic Spine Wo Contrast  Result Date: 11/23/2015 CLINICAL DATA:  82 year old female moving furniture now complaining of lower back and left flank pain. Right hip pain. Initial encounter. EXAM: CT THORACIC SPINE WITHOUT CONTRAST TECHNIQUE: Multidetector CT imaging of the thoracic spine was performed without intravenous contrast administration. Multiplanar CT image reconstructions were also generated. COMPARISON:  12/04/2015 CT abdomen and pelvis. 02/28/2012 chest x-ray. FINDINGS: Alignment: Thoracic kyphosis. Present examination incorporates from the  T1-2 disc space through the mid L3 level. Vertebrae: Remote appearing mild Schmorl's node deformity superior endplate T3 and T4. T6 inferior endplate compression fracture with 30% loss of height of indeterminate age. Remote small Schmorl's node deformity superior and inferior endplate T10. Remote mild superior endplate and moderate inferior endplate L2 compression deformity. Basilar atelectatic changes. Coronary artery calcifications. Ascending thoracic aorta measures up to 3.6 cm. Plaque thoracic aorta and great vessels. IMPRESSION: Thoracic kyphosis without significant spinal stenosis. T6 inferior endplate compression fracture with 30% loss of height of indeterminate age. Remote appearing mild Schmorl's node deformity superior endplate T3 and T4. Remote small Schmorl's node deformity superior and inferior endplate T10. Remote mild superior endplate and moderate inferior endplate L2 compression deformity. Aortic atherosclerosis.  Coronary artery calcifications. Bibasilar subsegmental atelectasis. Electronically Signed   By: Genia Del M.D.   On: 11/23/2015 17:11   Ct Hip Right Wo Contrast  Result Date: 11/23/2015 CLINICAL DATA:  Right hip pain which began while moving furniture today. EXAM: CT OF THE RIGHT HIP WITHOUT CONTRAST TECHNIQUE: Multidetector CT imaging of the right hip was performed according to the standard protocol. Multiplanar CT image reconstructions were also generated. COMPARISON:  Plain films right femur 12/06/2008. FINDINGS: Bones/Joint/Cartilage The patient has a right total hip arthroplasty. There is partial visualization of plate and screws along the lateral aspect of the right femur for fixation of a distal femur fracture which is seen on the comparison examination. The patient's hip is located. No acute bony or joint abnormality is seen. Bones are osteopenic. Ligaments Suboptimally assessed by CT. Muscles and Tendons Unremarkable in appearance. Soft tissues  No acute abnormality is  identified. Extensive sigmoid diverticulosis is seen. Atherosclerotic vascular disease is identified. Calcified uterine fibroids are also noted. IMPRESSION: No acute abnormality. Status post right hip replacement. Osteopenia. Atherosclerosis. Diverticulosis. Electronically Signed   By: Inge Rise M.D.   On: 11/23/2015 17:04   Ct L-spine No Charge  Result Date: 11/23/2015 CLINICAL DATA:  82 year old female with a history of flank pain EXAM: CT LUMBAR SPINE WITHOUT CONTRAST TECHNIQUE: Multidetector CT imaging of the lumbar spine was reformatted from CT performed as a stone protocol. Multiplanar CT image reconstructions were also generated. COMPARISON:  CT of the abdomen 11/23/2015, plain film 05/14/2007 FINDINGS: Segmentation: 5 lumbar vertebral bodies. Alignment: Grade 1 anterolisthesis of L5 on S1. No pars defect. No evidence of additional levels of anterolisthesis or retrolisthesis. No subluxation. Vertebrae: Diffuse osteopenia somewhat limiting evaluation for nondisplaced fractures. Less than 30% height loss at the anterior aspect of L2 vertebral body as well as at the anterior aspect of the L5 vertebral body. Prior plain film dated 2009 demonstrates unchanged configuration at L5. No comparison of L2. No fracture line identified. Paraspinal and other soft tissues: Unremarkable appearance of the paraspinal soft tissues. Left-sided nephrolithiasis, demonstrated on today's CT. Diverticulosis demonstrated on today's CT. Disc levels: L1-L2: No bony canal narrowing. Mild broad-based disc bulge. No significant foraminal narrowing. L2-L3: No bony canal narrowing. Mild broad-based disc bulge. No significant neural foraminal narrowing. L3-L4: No bony canal narrowing. Broad-based disc bulge and ligamentum flavum thickening, without significant soft tissue canal narrowing. No significant neural foraminal narrowing. L4-L5: Broad-based disc bulge. Ligamentum flavum thickening. No neural foraminal narrowing. L5-S1:  Grade 1 anterolisthesis with un coverage of the posterior aspect of the disc. Disc space narrowing. Moderate left greater than right neural foraminal narrowing secondary to focal disc protrusion, anterolisthesis, and loss of disc height. IMPRESSION: No acute fracture identified, however, diffuse osteopenia somewhat limits evaluation for nondisplaced fractures. L5 vertebral body is unchanged from the plain film of 2009, with less than 30% anterior height loss. Wedge deformity of L2, without comparison to determine acuity. If there is point tenderness at the L2 level with concern for fracture, MRI may be considered. Osteopenia. Grade 1 anterolisthesis of L5 on S1, with moderate left greater than right neural foraminal narrowing. Left-sided nephrolithiasis, demonstrated on the source images/abdomen pelvis CT. Signed, Dulcy Fanny. Earleen Newport, DO [facility] Electronically Signed   By: Corrie Mckusick D.O.   On: 11/23/2015 15:50   Dg Abd Portable 1v  Result Date: 11/24/2015 CLINICAL DATA:  Nausea and vomiting. Confusion and disorientation. History of stroke. EXAM: PORTABLE ABDOMEN - 1 VIEW COMPARISON:  05/14/2007 FINDINGS: Scattered gas and stool throughout the colon. No small or large bowel distention. No radiopaque stones. Radiopaque density in the left mid abdomen likely represents ingested opaque medication. Calcifications in the pelvis consistent with uterine fibroids. Degenerative changes in the spine. Postoperative change in the right hip. Surgical clips in the right upper quadrant. IMPRESSION: Nonobstructive bowel gas pattern. Electronically Signed   By: Lucienne Capers M.D.   On: 11/24/2015 05:37   Ct Renal Stone Study  Result Date: 11/23/2015 CLINICAL DATA:  Low back and left flank pain. EXAM: CT ABDOMEN AND PELVIS WITHOUT CONTRAST TECHNIQUE: Multidetector CT imaging of the abdomen and pelvis was performed following the standard protocol without IV  contrast. COMPARISON:  None. FINDINGS: Lower chest: Mild left lower lobe atelectasis and minimal right lower lobe atelectasis or scarring. Hepatobiliary: Cholecystectomy clips.  Unremarkable liver. Pancreas: Unremarkable. No  pancreatic ductal dilatation or surrounding inflammatory changes. Spleen: Normal in size without focal abnormality. Adrenals/Urinary Tract: 1.1 cm lower pole left renal calculus. Tiny mid left renal calculus or vascular calcification. Small exophytic hemorrhagic left renal cyst an more simple appearing exophytic upper pole left renal cyst. Mild dilatation of the right renal collecting system to the level of the ureteropelvic junction. No obstructing stone or mass seen. Normal appearing adrenal glands, ureters and urinary bladder. Stomach/Bowel: Large number of sigmoid and distal descending colon diverticulum. Mildly prominent small bowel loops containing air-fluid levels in the pelvis. No evidence of appendicitis. Unremarkable stomach. Vascular/Lymphatic: Dense atheromatous arterial calcifications, including the coronary arteries, abdominal aorta of and origin of the right renal artery. Reproductive: Densely calcified uterine fibroids. No adnexal masses. Other: None. Musculoskeletal: Right hip prosthesis with associated artifacts. Diffuse osteopenia. 30% L2 vertebral body compression deformity with mild bony retropulsion. 10% L4 superior endplate compression deformity with mild bony retropulsion. 25% L5 superior endplate compression deformity with mild bony retropulsion. Facet degenerative changes with associated grade 1 anterolisthesis at the L5-S1 level. IMPRESSION: 1. 1.1 cm lower pole nonobstructing left renal calculus and additional tiny left renal calculus or vascular calcification. 2. 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 3. Extensive sigmoid and descending colon diverticulosis. 4. Dense coronary artery atherosclerosis and aortic atherosclerosis. 5. Diffuse osteopenia with multiple  vertebral compression deformities with no acute fracture lines seen. 6. Mild mid small bowel ileus or partial obstruction. Electronically Signed   By: Claudie Revering M.D.   On: 11/23/2015 12:49    Lab Results: Basic Metabolic Panel:  Recent Labs  11/23/15 1448 11/24/15 0609  NA 141 139  K 4.7 4.3  CL 104 106  CO2  --  26  GLUCOSE 102* 95  BUN 27* 28*  CREATININE 1.00 1.16*  CALCIUM  --  8.8*   Liver Function Tests: No results for input(s): AST, ALT, ALKPHOS, BILITOT, PROT, ALBUMIN in the last 72 hours.   CBC:  Recent Labs  11/23/15 1356 11/23/15 1448 11/24/15 0609  WBC 9.7  --  7.4  NEUTROABS 7.2  --   --   HGB 14.5 14.6 12.0  HCT 43.1 43.0 37.7  MCV 91.7  --  93.3  PLT 246  --  217    Recent Results (from the past 240 hour(s))  Culture, blood (Routine X 2) w Reflex to ID Panel     Status: None (Preliminary result)   Collection Time: 11/23/15  2:39 PM  Result Value Ref Range Status   Specimen Description BLOOD BLOOD RIGHT ARM  Final   Special Requests AEROBUC ONLY 6 CC EACH  Final   Culture NO GROWTH 2 DAYS  Final   Report Status PENDING  Incomplete  Culture, blood (Routine X 2) w Reflex to ID Panel     Status: None (Preliminary result)   Collection Time: 11/23/15  4:42 PM  Result Value Ref Range Status   Specimen Description BLOOD LEFT WRIST  Final   Special Requests BOTTLES DRAWN AEROBIC AND ANAEROBIC [place]  Final   Culture NO GROWTH 2 DAYS  Final   Report Status PENDING  Incomplete     Hospital Course: This is a 82 year old who came to the emergency department because of back pain. She had CT of the abdomen and pelvis and there was some question of pyelonephritis as she was started on treatment for that. Urine culture unfortunately was not obtained before she started antibiotics. Blood cultures were negative. She underwent CT of the  spine and this showed compression fractures at lumbar vertebrae 2 and T6. She was confused when I saw her the day after she was  brought in for observation very hard of hearing and having a lot of pain. The next morning she was much improved. I think she would benefit from some home health services. She does live alone.  Discharge Exam: Blood pressure 110/72, pulse 84, temperature 97.9 F (36.6 C), temperature source Oral, resp. rate 20, height 5\' 1"  (1.549 m), weight 48.8 kg (107 lb 9.4 oz), SpO2 95 %. She is awake and alert. Hard of hearing but better. Not confused. Still with some back pain  Disposition: Home with home health services  Discharge Instructions    Face-to-face encounter (required for Medicare/Medicaid patients)    Complete by:  As directed    I Siddh Vandeventer L certify that this patient is under my care and that I, or a nurse practitioner or physician's assistant working with me, had a face-to-face encounter that meets the physician face-to-face encounter requirements with this patient on 11/25/2015. The encounter with the patient was in whole, or in part for the following medical condition(s) which is the primary reason for home health care (List medical condition): Back pain/compression fractures   The encounter with the patient was in whole, or in part, for the following medical condition, which is the primary reason for home health care:  Back pain/compression fractures   I certify that, based on my findings, the following services are medically necessary home health services:   Nursing Physical therapy     Reason for Medically Necessary Home Health Services:  Skilled Nursing- Change/Decline in Patient Status   My clinical findings support the need for the above services:  Unable to leave home safely without assistance and/or assistive device   Further, I certify that my clinical findings support that this patient is homebound due to:  Unable to leave home safely without assistance   Home Health    Complete by:  As directed    To provide the following care/treatments:   PT RN           Signed: Lyberti Thrush L   11/25/2015, 8:27 AM

## 2015-11-28 LAB — CULTURE, BLOOD (ROUTINE X 2)
CULTURE: NO GROWTH
CULTURE: NO GROWTH

## 2015-12-25 DIAGNOSIS — F419 Anxiety disorder, unspecified: Secondary | ICD-10-CM | POA: Diagnosis not present

## 2015-12-25 DIAGNOSIS — I10 Essential (primary) hypertension: Secondary | ICD-10-CM | POA: Diagnosis not present

## 2015-12-25 DIAGNOSIS — T7840XA Allergy, unspecified, initial encounter: Secondary | ICD-10-CM | POA: Diagnosis not present

## 2015-12-25 DIAGNOSIS — M1990 Unspecified osteoarthritis, unspecified site: Secondary | ICD-10-CM | POA: Diagnosis not present

## 2016-06-24 DIAGNOSIS — F419 Anxiety disorder, unspecified: Secondary | ICD-10-CM | POA: Diagnosis not present

## 2016-06-24 DIAGNOSIS — N3281 Overactive bladder: Secondary | ICD-10-CM | POA: Diagnosis not present

## 2016-06-24 DIAGNOSIS — I10 Essential (primary) hypertension: Secondary | ICD-10-CM | POA: Diagnosis not present

## 2016-06-24 DIAGNOSIS — F321 Major depressive disorder, single episode, moderate: Secondary | ICD-10-CM | POA: Diagnosis not present

## 2016-07-10 ENCOUNTER — Inpatient Hospital Stay (HOSPITAL_COMMUNITY)
Admission: EM | Admit: 2016-07-10 | Discharge: 2016-07-13 | DRG: 689 | Disposition: A | Discharge status: Skilled Nursing Facility | Payer: PPO | Attending: Pulmonary Disease | Admitting: Pulmonary Disease

## 2016-07-10 ENCOUNTER — Emergency Department (HOSPITAL_COMMUNITY): Payer: PPO

## 2016-07-10 ENCOUNTER — Encounter (HOSPITAL_COMMUNITY): Payer: Self-pay | Admitting: Cardiology

## 2016-07-10 DIAGNOSIS — R410 Disorientation, unspecified: Secondary | ICD-10-CM

## 2016-07-10 DIAGNOSIS — R4182 Altered mental status, unspecified: Secondary | ICD-10-CM | POA: Diagnosis not present

## 2016-07-10 DIAGNOSIS — G9341 Metabolic encephalopathy: Secondary | ICD-10-CM | POA: Diagnosis present

## 2016-07-10 DIAGNOSIS — N390 Urinary tract infection, site not specified: Principal | ICD-10-CM

## 2016-07-10 DIAGNOSIS — M810 Age-related osteoporosis without current pathological fracture: Secondary | ICD-10-CM | POA: Diagnosis not present

## 2016-07-10 DIAGNOSIS — E860 Dehydration: Secondary | ICD-10-CM | POA: Diagnosis present

## 2016-07-10 DIAGNOSIS — Z79899 Other long term (current) drug therapy: Secondary | ICD-10-CM | POA: Diagnosis not present

## 2016-07-10 DIAGNOSIS — Z8673 Personal history of transient ischemic attack (TIA), and cerebral infarction without residual deficits: Secondary | ICD-10-CM

## 2016-07-10 DIAGNOSIS — I739 Peripheral vascular disease, unspecified: Secondary | ICD-10-CM | POA: Diagnosis not present

## 2016-07-10 DIAGNOSIS — I6782 Cerebral ischemia: Secondary | ICD-10-CM | POA: Diagnosis not present

## 2016-07-10 DIAGNOSIS — R69 Illness, unspecified: Secondary | ICD-10-CM | POA: Diagnosis not present

## 2016-07-10 DIAGNOSIS — Z882 Allergy status to sulfonamides status: Secondary | ICD-10-CM

## 2016-07-10 DIAGNOSIS — N3000 Acute cystitis without hematuria: Secondary | ICD-10-CM | POA: Diagnosis not present

## 2016-07-10 DIAGNOSIS — N39498 Other specified urinary incontinence: Secondary | ICD-10-CM | POA: Diagnosis not present

## 2016-07-10 DIAGNOSIS — R32 Unspecified urinary incontinence: Secondary | ICD-10-CM | POA: Diagnosis present

## 2016-07-10 DIAGNOSIS — B961 Klebsiella pneumoniae [K. pneumoniae] as the cause of diseases classified elsewhere: Secondary | ICD-10-CM | POA: Diagnosis not present

## 2016-07-10 DIAGNOSIS — N3281 Overactive bladder: Secondary | ICD-10-CM | POA: Diagnosis not present

## 2016-07-10 DIAGNOSIS — H9190 Unspecified hearing loss, unspecified ear: Secondary | ICD-10-CM | POA: Diagnosis not present

## 2016-07-10 DIAGNOSIS — I10 Essential (primary) hypertension: Secondary | ICD-10-CM | POA: Diagnosis not present

## 2016-07-10 DIAGNOSIS — M6281 Muscle weakness (generalized): Secondary | ICD-10-CM | POA: Diagnosis not present

## 2016-07-10 DIAGNOSIS — R279 Unspecified lack of coordination: Secondary | ICD-10-CM | POA: Diagnosis not present

## 2016-07-10 DIAGNOSIS — F411 Generalized anxiety disorder: Secondary | ICD-10-CM | POA: Diagnosis not present

## 2016-07-10 LAB — URINALYSIS, ROUTINE W REFLEX MICROSCOPIC
BILIRUBIN URINE: NEGATIVE
Glucose, UA: NEGATIVE mg/dL
HGB URINE DIPSTICK: NEGATIVE
KETONES UR: NEGATIVE mg/dL
NITRITE: POSITIVE — AB
PROTEIN: NEGATIVE mg/dL
Specific Gravity, Urine: 1.003 — ABNORMAL LOW (ref 1.005–1.030)
Squamous Epithelial / LPF: NONE SEEN
pH: 6 (ref 5.0–8.0)

## 2016-07-10 LAB — CBC
HEMATOCRIT: 40.7 % (ref 36.0–46.0)
HEMOGLOBIN: 13.5 g/dL (ref 12.0–15.0)
MCH: 30.5 pg (ref 26.0–34.0)
MCHC: 33.2 g/dL (ref 30.0–36.0)
MCV: 91.9 fL (ref 78.0–100.0)
PLATELETS: 230 10*3/uL (ref 150–400)
RBC: 4.43 MIL/uL (ref 3.87–5.11)
RDW: 12.8 % (ref 11.5–15.5)
WBC: 6.9 10*3/uL (ref 4.0–10.5)

## 2016-07-10 LAB — COMPREHENSIVE METABOLIC PANEL
ALT: 11 U/L — ABNORMAL LOW (ref 14–54)
AST: 18 U/L (ref 15–41)
Albumin: 4.9 g/dL (ref 3.5–5.0)
Alkaline Phosphatase: 89 U/L (ref 38–126)
Anion gap: 11 (ref 5–15)
BUN: 21 mg/dL — AB (ref 6–20)
CHLORIDE: 107 mmol/L (ref 101–111)
CO2: 25 mmol/L (ref 22–32)
Calcium: 9.6 mg/dL (ref 8.9–10.3)
Creatinine, Ser: 1.05 mg/dL — ABNORMAL HIGH (ref 0.44–1.00)
GFR, EST AFRICAN AMERICAN: 51 mL/min — AB (ref >60)
GFR, EST NON AFRICAN AMERICAN: 44 mL/min — AB (ref >60)
Glucose, Bld: 107 mg/dL — ABNORMAL HIGH (ref 65–99)
POTASSIUM: 3.8 mmol/L (ref 3.5–5.1)
Sodium: 143 mmol/L (ref 135–145)
Total Bilirubin: 1.1 mg/dL (ref 0.3–1.2)
Total Protein: 8.2 g/dL — ABNORMAL HIGH (ref 6.5–8.1)

## 2016-07-10 MED ORDER — DEXTROSE 5 % IV SOLN
1.0000 g | Freq: Once | INTRAVENOUS | Status: AC
  Administered 2016-07-10: 1 g via INTRAVENOUS
  Filled 2016-07-10: qty 10

## 2016-07-10 MED ORDER — ENOXAPARIN SODIUM 30 MG/0.3ML ~~LOC~~ SOLN
30.0000 mg | SUBCUTANEOUS | Status: DC
  Administered 2016-07-10 – 2016-07-12 (×3): 30 mg via SUBCUTANEOUS
  Filled 2016-07-10 (×3): qty 0.3

## 2016-07-10 MED ORDER — SODIUM CHLORIDE 0.9 % IV BOLUS (SEPSIS)
500.0000 mL | Freq: Once | INTRAVENOUS | Status: AC
  Administered 2016-07-10: 500 mL via INTRAVENOUS

## 2016-07-10 MED ORDER — SODIUM CHLORIDE 0.9 % IV SOLN: INTRAVENOUS | Status: DC

## 2016-07-10 MED ORDER — ACETAMINOPHEN 650 MG RE SUPP: 650.0000 mg | Freq: Four times a day (QID) | RECTAL | Status: DC | PRN

## 2016-07-10 MED ORDER — SODIUM CHLORIDE 0.45 % IV SOLN
INTRAVENOUS | Status: DC
  Administered 2016-07-11 – 2016-07-13 (×4): via INTRAVENOUS

## 2016-07-10 MED ORDER — DEXTROSE 5 % IV SOLN
1.0000 g | INTRAVENOUS | Status: DC
  Administered 2016-07-11 – 2016-07-12 (×2): 1 g via INTRAVENOUS
  Filled 2016-07-10 (×3): qty 10

## 2016-07-10 MED ORDER — POLYETHYLENE GLYCOL 3350 17 G PO PACK: 17.0000 g | PACK | Freq: Every day | ORAL | Status: DC | PRN

## 2016-07-10 MED ORDER — ACETAMINOPHEN 325 MG PO TABS
650.0000 mg | ORAL_TABLET | Freq: Four times a day (QID) | ORAL | Status: DC | PRN
Start: 2016-07-10 — End: 2016-07-13
  Administered 2016-07-13: 650 mg via ORAL
  Filled 2016-07-10: qty 2

## 2016-07-10 NOTE — ED Provider Notes (Signed)
[facility] DEPT Provider Note   CSN: [id number] Arrival date & time: 07/10/16  1420     History   Chief Complaint Chief Complaint  Patient presents with  . Altered Mental Status    HPI [name] is a 83 y.o. female.  HPI Patient presents to the emergency room from home.  According to the nursing notes, the patient's family went to visit her today. She did not recognize them. They also indicated the patient was hearing someone singing yet she lives alone at home at the house. Family told EMS he thought the patient may need to be in a nursing home. It's difficult to get any history from the patient. She is alert and awake but seems to be upset that she is here in the emergency room. She is unable to hear me well.  When I asked her what brought her here, she proceeded to tell me that she has a good relationship with the [place]. She does not drink alcohol. She never had intercourse out of wedlock.  It would be best if we just let her go back home.  Being here is not good for her well being. Past Medical History:  Diagnosis Date  . Anxiety   . Hypertension   . Overactive bladder   . Stroke [facility])     Patient Active Problem List   Diagnosis Date Noted  . Compression fracture of L2 lumbar vertebra (HCC) 11/25/2015  . Compression fracture of spine, non-traumatic ([place]) 11/25/2015  . Osteoporosis 11/25/2015  . Peripheral arterial disease ([place]) 11/25/2015  . Pyelonephritis, acute 11/23/2015  . UTI (urinary tract infection) 11/23/2015  . Pyelonephritis 11/23/2015  . Community acquired pneumonia 07/11/2014  . Urinary tract infection, site not specified 03/01/2012  . Acute delirium 03/01/2012  . Essential hypertension, benign 03/01/2012  . Hypokalemia 03/01/2012    Past Surgical History:  Procedure Laterality Date  . HIP ADDUCTOR TENOTOMY    . JOINT REPLACEMENT      OB History    No data available       Home Medications    Prior to Admission medications     Medication Sig Start Date End Date Taking? Authorizing Provider  acetaminophen (TYLENOL) 500 MG tablet Take 1,000 mg by mouth at bedtime.     [provider]  alendronate (FOSAMAX) 70 MG tablet Take 1 tablet (70 mg total) by mouth once a week. Take with a full glass of water on an empty stomach. 11/25/15   Sinda Du, MD  amLODipine-benazepril (LOTREL) 10-20 MG per capsule Take 1 capsule by mouth daily.    [provider]  cefUROXime (CEFTIN) 250 MG tablet Take 1 tablet (250 mg total) by mouth 2 (two) times daily with a meal. 11/25/15   Sinda Du, MD  diazepam (VALIUM) 5 MG tablet Take 5 mg by mouth at bedtime.    [provider]  oxybutynin (DITROPAN-XL) 10 MG 24 hr tablet Take 10 mg by mouth daily.    [provider]  sertraline (ZOLOFT) 50 MG tablet  06/24/16   [provider]    Family History History reviewed. No pertinent family history.  Social History Social History  Substance Use Topics  . Smoking status: Never Smoker  . Smokeless tobacco: Never Used  . Alcohol use No     Allergies   Sulfa antibiotics   Review of Systems Review of Systems  All other systems reviewed and are negative.    Physical Exam Updated Vital Signs BP (!) 150/68  Pulse 92   Temp 98.1 F (36.7 C) (Oral)   Resp 16   Ht 1.549 m (5\' 1" )   Wt 48.5 kg (107 lb)   SpO2 95%   BMI 20.22 kg/m   Physical Exam  Constitutional:  Upset about being in the ED  HENT:  Head: Normocephalic and atraumatic.  Right Ear: External ear normal.  Left Ear: External ear normal.  Mouth/Throat: No oropharyngeal exudate.  Eyes: Conjunctivae are normal. Right eye exhibits no discharge. Left eye exhibits no discharge. No scleral icterus.  Neck: Neck supple. No tracheal deviation present.  Cardiovascular: Normal rate, regular rhythm and intact distal pulses.   Pulmonary/Chest: Effort normal and breath sounds normal. No stridor. No respiratory distress. She has  no wheezes. She has no rales.  Abdominal: Soft. Bowel sounds are normal. She exhibits no distension. There is no tenderness. There is no rebound and no guarding.  Musculoskeletal: She exhibits no edema or tenderness.  Neurological: She is alert. No cranial nerve deficit (no facial droop, extraocular movements intact, no slurred speech) or sensory deficit. She exhibits normal muscle tone. She displays no seizure activity. Coordination normal. GCS eye subscore is 4. GCS verbal subscore is 4. GCS motor subscore is 6.  Skin: Skin is warm and dry. No rash noted. She is not diaphoretic.  Psychiatric: Her mood appears anxious.  Rambling speech, somewhat tangential but hard to say that she is hearing my questions  Nursing note and vitals reviewed.    ED Treatments / Results  Labs (all labs ordered are listed, but only abnormal results are displayed) Labs Reviewed  COMPREHENSIVE METABOLIC PANEL - Abnormal; Notable for the following:       Result Value   Glucose, Bld 107 (*)    BUN 21 (*)    Creatinine, Ser 1.05 (*)    Total Protein 8.2 (*)    ALT 11 (*)    GFR calc non Af Amer 44 (*)    GFR calc Af Amer 51 (*)    All other components within normal limits  URINALYSIS, ROUTINE W REFLEX MICROSCOPIC - Abnormal; Notable for the following:    Color, Urine STRAW (*)    Specific Gravity, Urine 1.003 (*)    Nitrite POSITIVE (*)    Leukocytes, UA SMALL (*)    Bacteria, UA RARE (*)    All other components within normal limits  URINE CULTURE  CBC    Radiology Dg Chest 1 View  Result Date: 07/10/2016 CLINICAL DATA:  Altered mental status.  Confusion. EXAM: CHEST 1 VIEW COMPARISON:  07/11/2014 FINDINGS: The heart size and mediastinal contours are within normal limits. Both lungs are clear. The visualized skeletal structures are unremarkable. IMPRESSION: No active disease. Electronically Signed   By: Lorriane Shire M.D.   On: 07/10/2016 16:17   Ct Head Wo Contrast  Result Date: 07/10/2016 CLINICAL  DATA:  Family visited pt today and pt did recognize them. Per family pt states she hears someone singing . Pt lives alone. Friends come to check on pt. Family concerned if pt has been taking her medicines. truncated* EXAM: CT HEAD WITHOUT CONTRAST TECHNIQUE: Contiguous axial images were obtained from the base of the skull through the vertex without intravenous contrast. COMPARISON:  None. FINDINGS: Brain: No evidence of acute infarction, hemorrhage, hydrocephalus, extra-axial collection or mass lesion/mass effect. Left anterior frontal lobe and right cerebellar old infarcts. Small probable old occipital lobe infarcts. Old left basal ganglia lacune infarcts. Right caudate nucleus lacune infarct. Patchy areas of  white matter hypoattenuation consistent with mild to moderate chronic microvascular ischemic change. These findings are stable. Vascular: No hyperdense vessel or unexpected calcification. Skull: Normal. Negative for fracture or focal lesion. Sinuses/Orbits: No acute finding. Other: None IMPRESSION: 1. No acute intracranial abnormalities. 2. Multiple old infarcts and chronic microvascular ischemic change. Electronically Signed   By: Lajean Manes M.D.   On: 07/10/2016 16:13    Procedures Procedures (including critical care time)  Medications Ordered in ED Medications  cefTRIAXone (ROCEPHIN) 1 g in dextrose 5 % 50 mL IVPB (not administered)  0.9 %  sodium chloride infusion (not administered)  sodium chloride 0.9 % bolus 500 mL (500 mLs Intravenous New Bag/Given 07/10/16 1546)     Initial Impression / Assessment and Plan / ED Course  I have reviewed the triage vital signs and the nursing notes.  Pertinent labs & imaging results that were available during my care of the patient were reviewed by me and considered in my medical decision making (see chart for details).   patient presented to the emergency room for evaluation of possible change in her mental status. I was able to speak with the  patient's niece who does live in this area. Apparently the patient's granddaughter was in [place] and came up to visit the patient at her home. They noticed that the patient seemed to be confused. They called 911 to have her brought to the emergency room for evaluation. Patient lives by herself at home.  According to the niece sounds like the patient's had a gradual decline in her health and mental status however she does seem be somewhat worse than usual.  The patient's daughter is traveling from outside this area and will be coming to the hospital tomorrow.  Patient does appear to have a urinary tract infection. This may be contributing to her confusion. I suspect she does have a component of dementia. I will consult the medical service for admission, IV antibiotics.  Patient may need some type of either assisted living facility, home health etc when she is able to be discharged.  Final Clinical Impressions(s) / ED Diagnoses   Final diagnoses:  Acute cystitis without hematuria  Confused      Dorie Rank, MD 07/10/16 1737

## 2016-07-10 NOTE — H&P (Signed)
[facility] History and Physical  [name] XBM:[id number] DOB: [birth year] DOA: 07/10/2016  Referring physician: Dr Tomi Bamberger PCP: Sinda Du, MD   Chief Complaint: Altered mental status  HPI: [name] is a 83 y.o. female with altered mental status, found by family and she didn't recognize them.  In ED eval shows likely UTI and mild hypotension. Family feels she may need SNF placement.  Asked to see for admission.   No family and pt is poor historian, history obtained from chart.     Chart Feb 14 - acute UTI, delirium, HTN. EColi UTI, rx IV then po abx.   Jun 16 - LLL CAP, rx with abx and improved significantly .  [place] home.  Nov 17 - back pain > admit and dx'd with pyelonephritis per CT, UCx not sent, rx'd IV abx and improved. CT showed also compression fx's at L2 and T6. Very HOH.  Pain imrpoved, dc'd home w [facility].    ROS  n/a   Past Medical History  Past Medical History:  Diagnosis Date  . Anxiety   . Hypertension   . Overactive bladder   . Stroke [facility])    Past Surgical History  Past Surgical History:  Procedure Laterality Date  . HIP ADDUCTOR TENOTOMY    . JOINT REPLACEMENT     Family History History reviewed. No pertinent family history. Social History  reports that she has never smoked. She has never used smokeless tobacco. She reports that she does not drink alcohol or use drugs. Allergies  Allergies  Allergen Reactions  . Sulfa Antibiotics    Home medications Prior to Admission medications   Medication Sig Start Date End Date Taking? Authorizing Provider  acetaminophen (TYLENOL) 500 MG tablet Take 1,000 mg by mouth at bedtime.     [provider]  alendronate (FOSAMAX) 70 MG tablet Take 1 tablet (70 mg total) by mouth once a week. Take with a full glass of water on an empty stomach. 11/25/15   Sinda Du, MD  amLODipine-benazepril (LOTREL) 10-20 MG per capsule Take 1 capsule by mouth daily.    [provider]   cefUROXime (CEFTIN) 250 MG tablet Take 1 tablet (250 mg total) by mouth 2 (two) times daily with a meal. 11/25/15   Sinda Du, MD  diazepam (VALIUM) 5 MG tablet Take 5 mg by mouth at bedtime.    [provider]  oxybutynin (DITROPAN-XL) 10 MG 24 hr tablet Take 10 mg by mouth daily.    [provider]  sertraline (ZOLOFT) 50 MG tablet  06/24/16   [provider]   Liver Function Tests  Recent Labs Lab 07/10/16 1537  AST 18  ALT 11*  ALKPHOS 89  BILITOT 1.1  PROT 8.2*  ALBUMIN 4.9   No results for input(s): LIPASE, AMYLASE in the last 168 hours. CBC  Recent Labs Lab 07/10/16 1537  WBC 6.9  HGB 13.5  HCT 40.7  MCV 91.9  PLT 027   Basic Metabolic Panel  Recent Labs Lab 07/10/16 1537  NA 143  K 3.8  CL 107  CO2 25  GLUCOSE 107*  BUN 21*  CREATININE 1.05*  CALCIUM 9.6     Vitals:   07/10/16 1643 07/10/16 1700 07/10/16 1730 07/10/16 1800  BP:  (!) 145/55 (!) 140/55 (!) 132/59  Pulse: 92 85 81 80  Resp:      Temp:      TempSrc:      SpO2: 95% 95% 96% 94%  Weight:  Height:       Exam: Gen awake, pleasantly confused, calm No rash, cyanosis or gangrene Sclera anicteric, throat clear and dry  No jvd or bruits Chest clear bilat RRR no MRG Abd soft ntnd no mass or ascites +bs GU defer MS no joint effusions or deformity Ext no LE or UE edema / no wounds or ulcers Neuro is alert, Ox 1, very HOH, moves all ext   Na 143  K 3.8 CO2 25  BUN 21  Cr 1.05  Ca 9.6  LFTs ok Alb 4.9  eGFR 50 WBC 6k  HB 13  plt 230  Glu 107   UA > rare bact, 6-30 wbc, 0-5 rbc, 1.003, no epis, small LE/ +nit  CXR (independ reviewed) > no acute disease  CT head > "left anterior frontal lobe and right cerebellar old infarcts.... small probable old occipital lobe infarcts.... old left basal ganglia lacunar infarcts... right caudate nucleus lacune infarct" and microvasc ischemic small vessel changes. No acute finding.   Home meds > fosamax, amlod/  benazepril, Ceftin, valium at hs, ditropan-XL, zoloft    Assess: 1  Altered mental status - due to UTI and dehydration , prob underlying dementia per family history.  May need SNF placement , will get SW consult, start IVF's and give IV abx. B12, TSH.  Bedrest, swallow eval, DVT proph.  2  HTN - hold BP meds until hydrated 3  Urinary incont - per pt, is in diapers 4  Hx mult old CVA - by CT. Get swallow eval, hold home meds pending 5  Code status - last admit 2017 was DNR, should reassess when family is here   Plan - as above      [place] D [facility] Pager [phone]   If 7PM-7AM, please contact night-coverage [email or web address] Password [email or web address] 07/10/2016, 6:10 PM

## 2016-07-10 NOTE — ED Notes (Signed)
UTA neurological status.  Pt not following command.

## 2016-07-10 NOTE — ED Triage Notes (Signed)
Family visited pt today and pt did recognize them.  Per family pt  states she hears someone singing .   Pt lives alone.   Friends come to check on pt.   Family concerned if pt has been taking her medicines.  Children expressed to EMS that they want some SNF care .

## 2016-07-11 LAB — TSH: TSH: 1.978 u[IU]/mL (ref 0.350–4.500)

## 2016-07-11 LAB — BASIC METABOLIC PANEL
ANION GAP: 8 (ref 5–15)
BUN: 14 mg/dL (ref 6–20)
CHLORIDE: 106 mmol/L (ref 101–111)
CO2: 26 mmol/L (ref 22–32)
Calcium: 9.1 mg/dL (ref 8.9–10.3)
Creatinine, Ser: 0.81 mg/dL (ref 0.44–1.00)
GFR calc non Af Amer: >60 mL/min (ref >60)
Glucose, Bld: 106 mg/dL — ABNORMAL HIGH (ref 65–99)
POTASSIUM: 3.8 mmol/L (ref 3.5–5.1)
Sodium: 140 mmol/L (ref 135–145)

## 2016-07-11 LAB — CBC
HEMATOCRIT: 40.7 % (ref 36.0–46.0)
HEMOGLOBIN: 13.4 g/dL (ref 12.0–15.0)
MCH: 30.4 pg (ref 26.0–34.0)
MCHC: 32.9 g/dL (ref 30.0–36.0)
MCV: 92.3 fL (ref 78.0–100.0)
Platelets: 229 10*3/uL (ref 150–400)
RBC: 4.41 MIL/uL (ref 3.87–5.11)
RDW: 12.9 % (ref 11.5–15.5)
WBC: 8.2 10*3/uL (ref 4.0–10.5)

## 2016-07-11 LAB — VITAMIN B12: Vitamin B-12: 155 pg/mL — ABNORMAL LOW (ref 180–914)

## 2016-07-11 MED ORDER — HALOPERIDOL LACTATE 5 MG/ML IJ SOLN
2.0000 mg | Freq: Four times a day (QID) | INTRAMUSCULAR | Status: DC | PRN
  Administered 2016-07-11: 2 mg via INTRAMUSCULAR
  Filled 2016-07-11: qty 1

## 2016-07-11 NOTE — Progress Notes (Signed)
Patient refused to do swallow test.  Patient /////iv was lost upon arrival to floor multiple attempts to re-establish were unsuccessful.  MD contacted and was instructed to "try again in a few hours".  Attempted once again and was still unsuccessful.  Patient has not received ay fluids due to IV loss and unable to re-establish.  Will have day shift speak with MD again.

## 2016-07-11 NOTE — Progress Notes (Signed)
Subjective: She was admitted yesterday evening with presumed urinary tract infection, altered mental status. She has been very confused and agitated since admission. She has pulled out her IV. She refused swallow test I think mostly because she so hard of hearing she can't understand what we want her to do. She doesn't have any focal findings suggesting that she had a stroke.  Objective: Vital signs in last 24 hours: Temp:  [96.8 F (36 C)-98.1 F (36.7 C)] 96.8 F (36 C) (06/25 0534) Pulse Rate:  [77-95] 79 (06/25 0534) Resp:  [16-17] 16 (06/25 0534) BP: (118-154)/(55-88) 133/88 (06/25 0534) SpO2:  [94 %-97 %] 95 % (06/25 0534) Weight:  [48.5 kg (107 lb)-49.5 kg (109 lb 2 oz)] 49.5 kg (109 lb 2 oz) (06/24 2228) Weight change:     Intake/Output from previous day: 06/24 0701 - 06/25 0700 In: 550 [IV Piggyback:550] Out: 450 [Urine:450]  PHYSICAL EXAM General appearance: alert and Confused and very hard of hearing Resp: clear to auscultation bilaterally Cardio: regular rate and rhythm, S1, S2 normal, no murmur, click, rub or gallop GI: soft, non-tender; bowel sounds normal; no masses,  no organomegaly Extremities: extremities normal, atraumatic, no cyanosis or edema Mucous membranes are dry  Lab Results:  Results for orders placed or performed during the hospital encounter of 07/10/16 (from the past 48 hour(s))  CBC     Status: None   Collection Time: 07/10/16  3:37 PM  Result Value Ref Range   WBC 6.9 4.0 - 10.5 K/uL   RBC 4.43 3.87 - 5.11 MIL/uL   Hemoglobin 13.5 12.0 - 15.0 g/dL   HCT 40.7 36.0 - 46.0 %   MCV 91.9 78.0 - 100.0 fL   MCH 30.5 26.0 - 34.0 pg   MCHC 33.2 30.0 - 36.0 g/dL   RDW 12.8 11.5 - 15.5 %   Platelets 230 150 - 400 K/uL  Comprehensive metabolic panel     Status: Abnormal   Collection Time: 07/10/16  3:37 PM  Result Value Ref Range   Sodium 143 135 - 145 mmol/[name]   Potassium 3.8 3.5 - 5.1 mmol/[name]   Chloride 107 101 - 111 mmol/[name]   CO2 25 22 - 32 mmol/[name]    Glucose, Bld 107 (H) 65 - 99 mg/dL   BUN 21 (H) 6 - 20 mg/dL   Creatinine, Ser 1.05 (H) 0.44 - 1.00 mg/dL   Calcium 9.6 8.9 - 10.3 mg/dL   Total Protein 8.2 (H) 6.5 - 8.1 g/dL   Albumin 4.9 3.5 - 5.0 g/dL   AST 18 15 - 41 U/[name]   ALT 11 ([name]) 14 - 54 U/[name]   Alkaline Phosphatase 89 38 - 126 U/[name]   Total Bilirubin 1.1 0.3 - 1.2 mg/dL   GFR calc non Af Amer 44 ([name]) >60 mL/min   GFR calc Af Amer 51 ([name]) >60 mL/min    Comment: (NOTE) The eGFR has been calculated using the CKD EPI equation. This calculation has not been validated in all clinical situations. eGFR's persistently <60 mL/min signify possible Chronic Kidney Disease.    Anion gap 11 5 - 15  Urinalysis, Routine w reflex microscopic     Status: Abnormal   Collection Time: 07/10/16  4:43 PM  Result Value Ref Range   Color, Urine STRAW (A) YELLOW   APPearance CLEAR CLEAR   Specific Gravity, Urine 1.003 ([name]) 1.005 - 1.030   pH 6.0 5.0 - 8.0   Glucose, UA NEGATIVE NEGATIVE mg/dL   Hgb urine dipstick NEGATIVE  NEGATIVE   Bilirubin Urine NEGATIVE NEGATIVE   Ketones, ur NEGATIVE NEGATIVE mg/dL   Protein, ur NEGATIVE NEGATIVE mg/dL   Nitrite POSITIVE (A) NEGATIVE   Leukocytes, UA SMALL (A) NEGATIVE   RBC / HPF 0-5 0 - 5 RBC/hpf   WBC, UA 6-30 0 - 5 WBC/hpf   Bacteria, UA RARE (A) NONE SEEN   Squamous Epithelial / LPF NONE SEEN NONE SEEN   WBC Clumps PRESENT     ABGS No results for input(s): PHART, PO2ART, TCO2, HCO3 in the last 72 hours.  Invalid input(s): PCO2 CULTURES No results found for this or any previous visit (from the past 240 hour(s)). Studies/Results: Dg Chest 1 View  Result Date: 07/10/2016 CLINICAL DATA:  Altered mental status.  Confusion. EXAM: CHEST 1 VIEW COMPARISON:  07/11/2014 FINDINGS: The heart size and mediastinal contours are within normal limits. Both lungs are clear. The visualized skeletal structures are unremarkable. IMPRESSION: No active disease. Electronically Signed   By: Lorriane Shire M.D.   On:  07/10/2016 16:17   Ct Head Wo Contrast  Result Date: 07/10/2016 CLINICAL DATA:  Family visited pt today and pt did recognize them. Per family pt states she hears someone singing . Pt lives alone. Friends come to check on pt. Family concerned if pt has been taking her medicines. truncated* EXAM: CT HEAD WITHOUT CONTRAST TECHNIQUE: Contiguous axial images were obtained from the base of the skull through the vertex without intravenous contrast. COMPARISON:  None. FINDINGS: Brain: No evidence of acute infarction, hemorrhage, hydrocephalus, extra-axial collection or mass lesion/mass effect. Left anterior frontal lobe and right cerebellar old infarcts. Small probable old occipital lobe infarcts. Old left basal ganglia lacune infarcts. Right caudate nucleus lacune infarct. Patchy areas of white matter hypoattenuation consistent with mild to moderate chronic microvascular ischemic change. These findings are stable. Vascular: No hyperdense vessel or unexpected calcification. Skull: Normal. Negative for fracture or focal lesion. Sinuses/Orbits: No acute finding. Other: None IMPRESSION: 1. No acute intracranial abnormalities. 2. Multiple old infarcts and chronic microvascular ischemic change. Electronically Signed   By: Lajean Manes M.D.   On: 07/10/2016 16:13    Medications:  Prior to Admission:  Prescriptions Prior to Admission  Medication Sig Dispense Refill Last Dose  . acetaminophen (TYLENOL) 500 MG tablet Take 1,000 mg by mouth at bedtime.    11/22/2015 at Unknown time  . alendronate (FOSAMAX) 70 MG tablet Take 1 tablet (70 mg total) by mouth once a week. Take with a full glass of water on an empty stomach. 4 tablet 12   . amLODipine-benazepril (LOTREL) 10-20 MG per capsule Take 1 capsule by mouth daily.   11/22/2015 at Unknown time  . cefUROXime (CEFTIN) 250 MG tablet Take 1 tablet (250 mg total) by mouth 2 (two) times daily with a meal. 10 tablet 0   . diazepam (VALIUM) 5 MG tablet Take 5 mg by mouth at  bedtime.   11/22/2015 at Unknown time  . oxybutynin (DITROPAN-XL) 10 MG 24 hr tablet Take 10 mg by mouth daily.   11/22/2015 at Unknown time  . sertraline (ZOLOFT) 50 MG tablet       Scheduled: . enoxaparin (LOVENOX) injection  30 mg Subcutaneous Q24H   Continuous: . sodium chloride    . cefTRIAXone (ROCEPHIN)  IV     EUM:[id number] **OR** acetaminophen, haloperidol lactate, polyethylene glycol  Assesment: She was admitted with altered mental status with acute delirium. CT showed chronic strokes but nothing acute. I believe her problem is likely from urinary  tract infection. This is complicated by the fact that she is difficult to communicate with because of her hearing loss. Principal Problem:   Altered mental status Active Problems:   Urinary tract infection, site not specified   Acute delirium   Essential hypertension, benign   Urinary incontinence   History of CVA (cerebrovascular accident) - by CT scan    Plan: IM Haldol to try to help with her delirium and agitation. She may need IV team consult. Discontinue bedside swallow evaluation try clear liquid diet. Try to get IV established. Continue antibiotics.    LOS: 1 day   [name] 07/11/2016, 8:22 AM

## 2016-07-12 NOTE — Progress Notes (Signed)
POC discussed with the patients daughter.  She voiced concerns in relation to her thinking that the patient was having problems with her ears.  She requested that the MD be notified.  Dr. Luan Pulling was notified of the situation and he stated that he would go in and see the patient.

## 2016-07-12 NOTE — NC FL2 (Signed)
MEDICAID FL2 LEVEL OF CARE SCREENING TOOL     IDENTIFICATION  Patient Name: [name] Birthdate: 1920-02-26 Sex: female Admission Date (Current Location): 07/10/2016  [facility] and [place] Number:  [employer] and Address:  [facility] [address] [place]      Provider Number: [id number]  Attending Physician Name and Address:  Sinda Du, MD  Relative Name and Phone Number:       Current Level of Care: Hospital Recommended Level of Care: [place] Prior Approval Number:    Date Approved/Denied:   PASRR Number: [id number] ([id number])  Discharge Plan: SNF    Current Diagnoses: Patient Active Problem List   Diagnosis Date Noted  . Altered mental status 07/10/2016  . Urinary incontinence 07/10/2016  . History of CVA (cerebrovascular accident) - by CT scan 07/10/2016  . Compression fracture of L2 lumbar vertebra (HCC) 11/25/2015  . Compression fracture of spine, non-traumatic ([place]) 11/25/2015  . Osteoporosis 11/25/2015  . Peripheral arterial disease ([place]) 11/25/2015  . Pyelonephritis, acute 11/23/2015  . UTI (urinary tract infection) 11/23/2015  . Community acquired pneumonia 07/11/2014  . Urinary tract infection, site not specified 03/01/2012  . Acute delirium 03/01/2012  . Essential hypertension, benign 03/01/2012  . Hypokalemia 03/01/2012    Orientation RESPIRATION BLADDER Height & Weight     Self  Normal Incontinent Weight: 107 lb 14.4 oz (48.9 kg) Height:  5\' 1"  (154.9 cm)  BEHAVIORAL SYMPTOMS/MOOD NEUROLOGICAL BOWEL NUTRITION STATUS      Incontinent Diet (Heart Healthy)  AMBULATORY STATUS COMMUNICATION OF NEEDS Skin   Limited Assist Verbally Normal                       Personal Care Assistance Level of Assistance  Bathing, Feeding, Dressing Bathing Assistance: Limited assistance Feeding assistance: Independent Dressing Assistance: Limited assistance     Functional  Limitations Info  Sight, Hearing, Speech Sight Info: Adequate Hearing Info: Adequate Speech Info: Adequate    SPECIAL CARE FACTORS FREQUENCY  PT (By licensed PT)     PT Frequency: 5x/week              Contractures Contractures Info: Not present    Additional Factors Info  Psychotropic Code Status Info: Full Code Allergies Info: Sulfa Antibiotics Psychotropic Info: Valium, Zoloft         Current Medications (07/12/2016):  This is the current hospital active medication list Current Facility-Administered Medications  Medication Dose Route Frequency Provider Last Rate Last Dose  . 0.45 % sodium chloride infusion   Intravenous Continuous Roney Jaffe, MD 100 mL/hr at 07/11/16 2342    . acetaminophen (TYLENOL) tablet 650 mg  650 mg Oral Q6H PRN Roney Jaffe, MD       Or  . acetaminophen (TYLENOL) suppository 650 mg  650 mg Rectal Q6H PRN Roney Jaffe, MD      . cefTRIAXone (ROCEPHIN) 1 g in dextrose 5 % 50 mL IVPB  1 g Intravenous Q24H Roney Jaffe, MD   Stopped at 07/11/16 1914  . enoxaparin (LOVENOX) injection 30 mg  30 mg Subcutaneous Q24H Roney Jaffe, MD   30 mg at 07/11/16 2342  . haloperidol lactate (HALDOL) injection 2 mg  2 mg Intramuscular Q6H PRN Sinda Du, MD   2 mg at 07/11/16 0931  . polyethylene glycol (MIRALAX / GLYCOLAX) packet 17 g  17 g Oral Daily PRN Roney Jaffe, MD         Discharge  Medications: Please see discharge summary for a list of discharge medications.  Relevant Imaging Results:  Relevant Lab Results:   Additional Information SSN [id number], Clydene Pugh, LCSW

## 2016-07-12 NOTE — Progress Notes (Signed)
Late Entry:   Dr. Luan Pulling stated that he checked the patients ears.  He reports a little wax in the left ear and no inflammation in the right ear.  This was reported to the patients daughter what the MD stated.

## 2016-07-12 NOTE — Clinical Social Work Note (Signed)
LCSW left a message for ptient's daughter (in regards to PT recommendation) requesting return contact.   Orlena Garmon, Clydene Pugh, LCSW

## 2016-07-12 NOTE — Evaluation (Signed)
Physical Therapy Evaluation Patient Details Name: [name] MRN: [id number] DOB: 1920/09/28 Today's Date: 07/12/2016   History of Present Illness  83 yo female with UTI and low BP was admitted, delirium, incontinence of urine, AMS, had clear CT of head and was referred to PT to ck for SNF.  PMHx:  Stroke, HTN  Clinical Impression  Pt is up to walk with PT today, needs close supervising as she has some listing toward L side, LLE weakness and drifts on walker to L with no obvious awareness of need to correct.  Has to get help turning walker for big directional changes, but may be cognition today.  Plan to see acutely for LLE weakness and her gait changes, and progress to limit her need to stay at SNF before returning home.    Follow Up Recommendations SNF    Equipment Recommendations  Rolling walker with 5" wheels (unless pt has one at home)    Recommendations for Other Services       Precautions / Restrictions Precautions Precautions: Fall (confusion) Restrictions Weight Bearing Restrictions: No      Mobility  Bed Mobility Overal bed mobility: Needs Assistance Bed Mobility: Supine to Sit;Sit to Supine     Supine to sit: Min assist Sit to supine: Min guard   General bed mobility comments: supported trunk and directed pt to EOB, supervised back  Transfers Overall transfer level: Needs assistance Equipment used: Rolling walker (2 wheeled);1 person hand held assist Transfers: Sit to/from Stand Sit to Stand: Min guard;Min assist         General transfer comment: pt needs reminders for hand placement  Ambulation/Gait Ambulation/Gait assistance: Min guard;Min assist Ambulation Distance (Feet): 90 Feet Assistive device: Rolling walker (2 wheeled);1 person hand held assist Gait Pattern/deviations: Step-to pattern;Step-through pattern;Drifts right/left;Trunk flexed;Wide base of support;Decreased stride length Gait velocity: reduced Gait velocity interpretation: Below  normal speed for age/gender General Gait Details: directed walker to R and to avoid obstacles  Stairs            Wheelchair Mobility    Modified Rankin (Stroke Patients Only) Modified Rankin (Stroke Patients Only) Pre-Morbid Rankin Score: Slight disability Modified Rankin: Moderately severe disability     Balance Overall balance assessment: Needs assistance Sitting-balance support: Feet supported Sitting balance-Leahy Scale: Fair     Standing balance support: Bilateral upper extremity supported Standing balance-Leahy Scale: Poor Standing balance comment: reminders to stand upright and to use RW                             Pertinent Vitals/Pain Pain Assessment: No/denies pain    Home Living Family/patient expects to be discharged to:: Skilled nursing facility Living Arrangements: Alone               Additional Comments: per pt has family nearby    Prior Function Level of Independence: Independent with assistive device(s)               Hand Dominance   Dominant Hand: Right    Extremity/Trunk Assessment   Upper Extremity Assessment Upper Extremity Assessment: Generalized weakness    Lower Extremity Assessment Lower Extremity Assessment: Generalized weakness    Cervical / Trunk Assessment Cervical / Trunk Assessment: Kyphotic  Communication   Communication: Other (comment) (pt requires some repetition of instructions)  Cognition Arousal/Alertness: Awake/alert Behavior During Therapy: Flat affect Overall Cognitive Status: History of cognitive impairments - at baseline  General Comments      Exercises     Assessment/Plan    PT Assessment Patient needs continued PT services  PT Problem List Decreased strength;Decreased range of motion;Decreased activity tolerance;Decreased balance;Decreased mobility;Decreased coordination;Decreased cognition;Decreased knowledge of use of  DME;Decreased safety awareness       PT Treatment Interventions DME instruction;Gait training;Functional mobility training;Therapeutic activities;Therapeutic exercise;Balance training;Neuromuscular re-education;Patient/family education    PT Goals (Current goals can be found in the Care Plan section)  Acute Rehab PT Goals Patient Stated Goal: to try to walk farther PT Goal Formulation: With patient Time For Goal Achievement: 07/26/16 Potential to Achieve Goals: Good    Frequency Min 3X/week   Barriers to discharge Inaccessible home environment;Decreased caregiver support home alone     Co-evaluation               AM-PAC PT "6 Clicks" Daily Activity  Outcome Measure Difficulty turning over in bed (including adjusting bedclothes, sheets and blankets)?: A Little Difficulty moving from lying on back to sitting on the side of the bed? : Total Difficulty sitting down on and standing up from a chair with arms (e.g., wheelchair, bedside commode, etc,.)?: Total Help needed moving to and from a bed to chair (including a wheelchair)?: A Little Help needed walking in hospital room?: A Little Help needed climbing 3-5 steps with a railing? : A Lot 6 Click Score: 13    End of Session Equipment Utilized During Treatment: Gait belt Activity Tolerance: Patient limited by fatigue;Other (comment) (LLE is weak per pt) Patient left: in bed;with call bell/phone within reach;with bed alarm set Nurse Communication: Mobility status PT Visit Diagnosis: Unsteadiness on feet (R26.81);Muscle weakness (generalized) (M62.81);Hemiplegia and hemiparesis Hemiplegia - Right/Left: Left Hemiplegia - dominant/non-dominant: Non-dominant Hemiplegia - caused by: Unspecified    Time: 8366-2947 PT Time Calculation (min) (ACUTE ONLY): 24 min   Charges:   PT Evaluation $PT Eval Low Complexity: 1 Procedure PT Treatments $Gait Training: 8-22 mins   PT G Codes:   PT G-Codes **NOT FOR INPATIENT  CLASS** Functional Assessment Tool Used: AM-PAC 6 Clicks Basic Mobility    Ramond Dial 07/12/2016, 1:02 PM   1:04 PM, 07/12/16 Mee Hives, PT, MS Physical Therapist - [place] [phone] (Office)

## 2016-07-12 NOTE — Progress Notes (Signed)
Subjective: She is much improved this morning. She is much less agitated. She is more cooperative. She says she didn't sleep well last night. As noted in the RNs note her daughter is concerned that there is a problem with her ears.  Objective: Vital signs in last 24 hours: Temp:  [97.9 F (36.6 C)-98.1 F (36.7 C)] 97.9 F (36.6 C) (06/26 0500) Pulse Rate:  [72-78] 78 (06/26 0500) Resp:  [16] 16 (06/26 0500) BP: (135-144)/(51-71) 135/71 (06/26 0500) SpO2:  [95 %-96 %] 96 % (06/26 0500) Weight:  [48.9 kg (107 lb 14.4 oz)] 48.9 kg (107 lb 14.4 oz) (06/26 0500) Weight change: 0.408 kg (14.4 oz)    Intake/Output from previous day: 06/25 0701 - 06/26 0700 In: 1300 [I.V.:1300] Out: -   PHYSICAL EXAM General appearance: alert, cooperative and no distress Resp: clear to auscultation bilaterally Cardio: regular rate and rhythm, S1, S2 normal, no murmur, click, rub or gallop GI: soft, non-tender; bowel sounds normal; no masses,  no organomegaly Extremities: extremities normal, atraumatic, no cyanosis or edema I examined both the ears. She has a little bit of wax in the left ear but is not occlusive in her right ear including the tympanic membrane look normal. She is very hard of hearing  Lab Results:  Results for orders placed or performed during the hospital encounter of 07/10/16 (from the past 48 hour(s))  CBC     Status: None   Collection Time: 07/10/16  3:37 PM  Result Value Ref Range   WBC 6.9 4.0 - 10.5 K/uL   RBC 4.43 3.87 - 5.11 MIL/uL   Hemoglobin 13.5 12.0 - 15.0 g/dL   HCT 40.7 36.0 - 46.0 %   MCV 91.9 78.0 - 100.0 fL   MCH 30.5 26.0 - 34.0 pg   MCHC 33.2 30.0 - 36.0 g/dL   RDW 12.8 11.5 - 15.5 %   Platelets 230 150 - 400 K/uL  Comprehensive metabolic panel     Status: Abnormal   Collection Time: 07/10/16  3:37 PM  Result Value Ref Range   Sodium 143 135 - 145 mmol/L   Potassium 3.8 3.5 - 5.1 mmol/L   Chloride 107 101 - 111 mmol/L   CO2 25 22 - 32 mmol/L   Glucose, Bld 107 (H) 65 - 99 mg/dL   BUN 21 (H) 6 - 20 mg/dL   Creatinine, Ser 1.05 (H) 0.44 - 1.00 mg/dL   Calcium 9.6 8.9 - 10.3 mg/dL   Total Protein 8.2 (H) 6.5 - 8.1 g/dL   Albumin 4.9 3.5 - 5.0 g/dL   AST 18 15 - 41 U/L   ALT 11 (L) 14 - 54 U/L   Alkaline Phosphatase 89 38 - 126 U/L   Total Bilirubin 1.1 0.3 - 1.2 mg/dL   GFR calc non Af Amer 44 (L) >60 mL/min   GFR calc Af Amer 51 (L) >60 mL/min    Comment: (NOTE) The eGFR has been calculated using the CKD EPI equation. This calculation has not been validated in all clinical situations. eGFR's persistently <60 mL/min signify possible Chronic Kidney Disease.    Anion gap 11 5 - 15  Urinalysis, Routine w reflex microscopic     Status: Abnormal   Collection Time: 07/10/16  4:43 PM  Result Value Ref Range   Color, Urine STRAW (A) YELLOW   APPearance CLEAR CLEAR   Specific Gravity, Urine 1.003 (L) 1.005 - 1.030   pH 6.0 5.0 - 8.0   Glucose, UA NEGATIVE NEGATIVE mg/dL  Hgb urine dipstick NEGATIVE NEGATIVE   Bilirubin Urine NEGATIVE NEGATIVE   Ketones, ur NEGATIVE NEGATIVE mg/dL   Protein, ur NEGATIVE NEGATIVE mg/dL   Nitrite POSITIVE (A) NEGATIVE   Leukocytes, UA SMALL (A) NEGATIVE   RBC / HPF 0-5 0 - 5 RBC/hpf   WBC, UA 6-30 0 - 5 WBC/hpf   Bacteria, UA RARE (A) NONE SEEN   Squamous Epithelial / LPF NONE SEEN NONE SEEN   WBC Clumps PRESENT   TSH     Status: None   Collection Time: 07/11/16  8:46 AM  Result Value Ref Range   TSH 1.978 0.350 - 4.500 uIU/mL    Comment: Performed by a 3rd Generation assay with a functional sensitivity of <=0.01 uIU/mL.  Basic metabolic panel     Status: Abnormal   Collection Time: 07/11/16  8:46 AM  Result Value Ref Range   Sodium 140 135 - 145 mmol/L   Potassium 3.8 3.5 - 5.1 mmol/L   Chloride 106 101 - 111 mmol/L   CO2 26 22 - 32 mmol/L   Glucose, Bld 106 (H) 65 - 99 mg/dL   BUN 14 6 - 20 mg/dL   Creatinine, Ser 0.81 0.44 - 1.00 mg/dL   Calcium 9.1 8.9 - 10.3 mg/dL   GFR calc  non Af Amer >60 >60 mL/min   GFR calc Af Amer >60 >60 mL/min    Comment: (NOTE) The eGFR has been calculated using the CKD EPI equation. This calculation has not been validated in all clinical situations. eGFR's persistently <60 mL/min signify possible Chronic Kidney Disease.    Anion gap 8 5 - 15  CBC     Status: None   Collection Time: 07/11/16  8:46 AM  Result Value Ref Range   WBC 8.2 4.0 - 10.5 K/uL   RBC 4.41 3.87 - 5.11 MIL/uL   Hemoglobin 13.4 12.0 - 15.0 g/dL   HCT 40.7 36.0 - 46.0 %   MCV 92.3 78.0 - 100.0 fL   MCH 30.4 26.0 - 34.0 pg   MCHC 32.9 30.0 - 36.0 g/dL   RDW 12.9 11.5 - 15.5 %   Platelets 229 150 - 400 K/uL  Vitamin B12     Status: Abnormal   Collection Time: 07/11/16  8:46 AM  Result Value Ref Range   Vitamin B-12 155 (L) 180 - 914 pg/mL    Comment: (NOTE) This assay is not validated for testing neonatal or myeloproliferative syndrome specimens for Vitamin B12 levels. Performed at [facility], [place] [address] [place], [place] [address]     ABGS No results for input(s): PHART, PO2ART, TCO2, HCO3 in the last 72 hours.  Invalid input(s): PCO2 CULTURES No results found for this or any previous visit (from the past 240 hour(s)). Studies/Results: Dg Chest 1 View  Result Date: 07/10/2016 CLINICAL DATA:  Altered mental status.  Confusion. EXAM: CHEST 1 VIEW COMPARISON:  07/11/2014 FINDINGS: The heart size and mediastinal contours are within normal limits. Both lungs are clear. The visualized skeletal structures are unremarkable. IMPRESSION: No active disease. Electronically Signed   By: Lorriane Shire M.D.   On: 07/10/2016 16:17   Ct Head Wo Contrast  Result Date: 07/10/2016 CLINICAL DATA:  Family visited pt today and pt did recognize them. Per family pt states she hears someone singing . Pt lives alone. Friends come to check on pt. Family concerned if pt has been taking her medicines. truncated* EXAM: CT HEAD WITHOUT CONTRAST TECHNIQUE: Contiguous  axial images were  obtained from the base of the skull through the vertex without intravenous contrast. COMPARISON:  None. FINDINGS: Brain: No evidence of acute infarction, hemorrhage, hydrocephalus, extra-axial collection or mass lesion/mass effect. Left anterior frontal lobe and right cerebellar old infarcts. Small probable old occipital lobe infarcts. Old left basal ganglia lacune infarcts. Right caudate nucleus lacune infarct. Patchy areas of white matter hypoattenuation consistent with mild to moderate chronic microvascular ischemic change. These findings are stable. Vascular: No hyperdense vessel or unexpected calcification. Skull: Normal. Negative for fracture or focal lesion. Sinuses/Orbits: No acute finding. Other: None IMPRESSION: 1. No acute intracranial abnormalities. 2. Multiple old infarcts and chronic microvascular ischemic change. Electronically Signed   By: Lajean Manes M.D.   On: 07/10/2016 16:13    Medications:  Prior to Admission:  Prescriptions Prior to Admission  Medication Sig Dispense Refill Last Dose  . acetaminophen (TYLENOL) 500 MG tablet Take 1,000 mg by mouth at bedtime.    unknown  . amLODipine-benazepril (LOTREL) 10-20 MG capsule Take 1 capsule by mouth daily.    unknown  . diazepam (VALIUM) 5 MG tablet Take 5 mg by mouth every 8 (eight) hours as needed for anxiety.    unknown  . oxybutynin (DITROPAN-XL) 10 MG 24 hr tablet Take 10 mg by mouth daily.   unknown  . sertraline (ZOLOFT) 50 MG tablet Take 50 mg by mouth daily.    unknown   Scheduled: . enoxaparin (LOVENOX) injection  30 mg Subcutaneous Q24H   Continuous: . sodium chloride 100 mL/hr at 07/11/16 2342  . cefTRIAXone (ROCEPHIN)  IV Stopped (07/11/16 1914)   HAL:[id number] **OR** acetaminophen, haloperidol lactate, polyethylene glycol  Assesment: She was admitted with altered mental status which appears to be related to urinary tract infection. She had acute delirium and that is better. At baseline she  has hypertension and that's well-controlled. She was dehydrated and I think on admission and that's better. She has incontinence of urine at baseline. She has had multiple CVAs based on her CT but nothing acute. She is hard of hearing but no acute abnormalities seen. Principal Problem:   Altered mental status Active Problems:   Urinary tract infection, site not specified   Acute delirium   Essential hypertension, benign   Urinary incontinence   History of CVA (cerebrovascular accident) - by CT scan    Plan: No change. Request physical therapy consultation.    LOS: 2 days   Kashtyn Jankowski L 07/12/2016, 8:38 AM

## 2016-07-13 ENCOUNTER — Inpatient Hospital Stay
Admission: RE | Admit: 2016-07-13 | Discharge: 2016-08-04 | Disposition: A | Discharge status: Home / Self Care | Payer: PPO | Source: Ambulatory Visit | Attending: Pulmonary Disease | Admitting: Pulmonary Disease

## 2016-07-13 DIAGNOSIS — M6281 Muscle weakness (generalized): Secondary | ICD-10-CM | POA: Diagnosis not present

## 2016-07-13 DIAGNOSIS — I739 Peripheral vascular disease, unspecified: Secondary | ICD-10-CM | POA: Diagnosis not present

## 2016-07-13 DIAGNOSIS — R279 Unspecified lack of coordination: Secondary | ICD-10-CM | POA: Diagnosis not present

## 2016-07-13 DIAGNOSIS — M810 Age-related osteoporosis without current pathological fracture: Secondary | ICD-10-CM | POA: Diagnosis not present

## 2016-07-13 DIAGNOSIS — F411 Generalized anxiety disorder: Secondary | ICD-10-CM | POA: Diagnosis not present

## 2016-07-13 DIAGNOSIS — N3281 Overactive bladder: Secondary | ICD-10-CM | POA: Diagnosis not present

## 2016-07-13 DIAGNOSIS — H9190 Unspecified hearing loss, unspecified ear: Secondary | ICD-10-CM | POA: Diagnosis not present

## 2016-07-13 DIAGNOSIS — N390 Urinary tract infection, site not specified: Secondary | ICD-10-CM | POA: Diagnosis not present

## 2016-07-13 DIAGNOSIS — R4182 Altered mental status, unspecified: Secondary | ICD-10-CM | POA: Diagnosis not present

## 2016-07-13 DIAGNOSIS — B961 Klebsiella pneumoniae [K. pneumoniae] as the cause of diseases classified elsewhere: Secondary | ICD-10-CM | POA: Diagnosis not present

## 2016-07-13 DIAGNOSIS — I10 Essential (primary) hypertension: Secondary | ICD-10-CM | POA: Diagnosis not present

## 2016-07-13 MED ORDER — CEFUROXIME AXETIL 250 MG PO TABS: 250.0000 mg | ORAL_TABLET | Freq: Two times a day (BID) | ORAL | 0 refills | Status: AC

## 2016-07-13 NOTE — Clinical Social Work Note (Signed)
Clinical Social Work Assessment  Patient Details  Name: [name] MRN: [id number] Date of Birth: October 24, 1920  Date of referral:  07/13/16               Reason for consult:  Discharge Planning                Permission sought to share information with:    Permission granted to share information::     Name::        Agency::     Relationship::     Contact Information:  Dtr, Tally Joe, listed on chart.   Housing/Transportation Living arrangements for the past 2 months:  [place] of Information:  Adult Children Patient Interpreter Needed:  None Criminal Activity/Legal Involvement Pertinent to Current Situation/Hospitalization:  No - Comment as needed Significant Relationships:  Adult Children, Other Family Members, Friend Lives with:  Self Do you feel safe going back to the place where you live?  Yes Need for family participation in patient care:  Yes (Comment)  Care giving concerns:  None identified by daughter.    Social Worker assessment / plan:  Patient lives alone, ambulates on a walker and has multiple family and friends who check on her throughout the day.  She receives meals on wheels.  Ms. [name] was agreeable to SNF short term. She stated that if [facility] could not accept patient, that she would take patient home and arrange for a regular set of caregivers to be scheduled to be with patient.  LCSW advised of patient's discharge today.   Employment status:  Retired Nurse, adult PT Recommendations:  [place] / Referral to community resources:  [place]  Patient/Family's Response to care: Family is agreeable to short term SNF for rehab.   Patient/Family's Understanding of and Emotional Response to Diagnosis, Current Treatment, and Prognosis:  Family has understanding of patient's diagnosis, treatment and prognosis.   Emotional Assessment Appearance:  Appears stated  age Attitude/Demeanor/Rapport:  Unable to Assess Affect (typically observed):  Unable to Assess Orientation:  Oriented to Self Alcohol / Substance use:  Not Applicable Psych involvement (Current and /or in the community):  No (Comment)  Discharge Needs  Concerns to be addressed:  Discharge Planning Concerns Readmission within the last 30 days:  Yes Current discharge risk:  None Barriers to Discharge:  Insurance Authorization   Ihor Gully, LCSW 07/13/2016, 9:52 AM

## 2016-07-13 NOTE — Care Management Important Message (Signed)
Important Message  Patient Details  Name: [name] MRN: [id number] Date of Birth: 09/11/1920   Medicare Important Message Given:  Yes    Sherald Barge, RN 07/13/2016, 10:14 AM

## 2016-07-13 NOTE — Progress Notes (Signed)
Subjective: She says she feels okay except she's having some pain in her neck. She is presumed to have a urinary tract infection. Her confusion is better. She has greater than 100,000 gram-negative rods in her urine. Skilled care facility treatment has been recommended  Objective: Vital signs in last 24 hours: Pulse Rate:  [84-104] 104 (06/27 0440) Resp:  [16-20] 20 (06/27 0440) BP: (172-184)/(84-87) 184/87 (06/27 0440) SpO2:  [93 %-95 %] 95 % (06/27 0440) Weight:  [48.4 kg (106 lb 11.6 oz)] 48.4 kg (106 lb 11.6 oz) (06/27 0440) Weight change: -0.533 kg (-1 lb 2.8 oz)    Intake/Output from previous day: 06/26 0701 - 06/27 0700 In: 2766.7 [I.V.:2666.7; IV Piggyback:100] Out: -   PHYSICAL EXAM General appearance: alert, cooperative and Mildly confused and very hard of hearing Resp: clear to auscultation bilaterally Cardio: regular rate and rhythm, S1, S2 normal, no murmur, click, rub or gallop GI: soft, non-tender; bowel sounds normal; no masses,  no organomegaly Extremities: extremities normal, atraumatic, no cyanosis or edema Skin warm and dry  Lab Results:  Results for orders placed or performed during the hospital encounter of 07/10/16 (from the past 48 hour(s))  TSH     Status: None   Collection Time: 07/11/16  8:46 AM  Result Value Ref Range   TSH 1.978 0.350 - 4.500 uIU/mL    Comment: Performed by a 3rd Generation assay with a functional sensitivity of <=0.01 uIU/mL.  Basic metabolic panel     Status: Abnormal   Collection Time: 07/11/16  8:46 AM  Result Value Ref Range   Sodium 140 135 - 145 mmol/L   Potassium 3.8 3.5 - 5.1 mmol/L   Chloride 106 101 - 111 mmol/L   CO2 26 22 - 32 mmol/L   Glucose, Bld 106 (H) 65 - 99 mg/dL   BUN 14 6 - 20 mg/dL   Creatinine, Ser 0.81 0.44 - 1.00 mg/dL   Calcium 9.1 8.9 - 10.3 mg/dL   GFR calc non Af Amer >60 >60 mL/min   GFR calc Af Amer >60 >60 mL/min    Comment: (NOTE) The eGFR has been calculated using the CKD EPI  equation. This calculation has not been validated in all clinical situations. eGFR's persistently <60 mL/min signify possible Chronic Kidney Disease.    Anion gap 8 5 - 15  CBC     Status: None   Collection Time: 07/11/16  8:46 AM  Result Value Ref Range   WBC 8.2 4.0 - 10.5 K/uL   RBC 4.41 3.87 - 5.11 MIL/uL   Hemoglobin 13.4 12.0 - 15.0 g/dL   HCT 40.7 36.0 - 46.0 %   MCV 92.3 78.0 - 100.0 fL   MCH 30.4 26.0 - 34.0 pg   MCHC 32.9 30.0 - 36.0 g/dL   RDW 12.9 11.5 - 15.5 %   Platelets 229 150 - 400 K/uL  Vitamin B12     Status: Abnormal   Collection Time: 07/11/16  8:46 AM  Result Value Ref Range   Vitamin B-12 155 (L) 180 - 914 pg/mL    Comment: (NOTE) This assay is not validated for testing neonatal or myeloproliferative syndrome specimens for Vitamin B12 levels. Performed at [facility], [place] [address] [place], [place] [address]     ABGS No results for input(s): PHART, PO2ART, TCO2, HCO3 in the last 72 hours.  Invalid input(s): PCO2 CULTURES Recent Results (from the past 240 hour(s))  Urine Culture     Status: Abnormal (Preliminary result)  Collection Time: 07/10/16  4:44 PM  Result Value Ref Range Status   Specimen Description URINE, CATHETERIZED  Final   Special Requests NONE  Final   Culture >=100,000 COLONIES/mL GRAM NEGATIVE RODS (A)  Final   Report Status PENDING  Incomplete   Studies/Results: No results found.  Medications:  Prior to Admission:  Prescriptions Prior to Admission  Medication Sig Dispense Refill Last Dose  . acetaminophen (TYLENOL) 500 MG tablet Take 1,000 mg by mouth at bedtime.    unknown  . amLODipine-benazepril (LOTREL) 10-20 MG capsule Take 1 capsule by mouth daily.    unknown  . diazepam (VALIUM) 5 MG tablet Take 5 mg by mouth every 8 (eight) hours as needed for anxiety.    unknown  . oxybutynin (DITROPAN-XL) 10 MG 24 hr tablet Take 10 mg by mouth daily.   unknown  . sertraline (ZOLOFT) 50 MG tablet Take 50 mg by mouth  daily.    unknown   Scheduled: . enoxaparin (LOVENOX) injection  30 mg Subcutaneous Q24H   Continuous: . sodium chloride 100 mL/hr at 07/12/16 2330  . cefTRIAXone (ROCEPHIN)  IV Stopped (07/12/16 1926)   VPC:[id number] **OR** acetaminophen, haloperidol lactate, polyethylene glycol  Assesment: She was admitted with altered mental status thought to be related to a urinary tract infection. This is acute encephalopathy. She did have some delirium. At baseline she has hypertension. She has had several strokes by CT. She is extremely hard of hearing so communication is difficult. She is growing greater than 100,000 gram-negative rods in her urine but we don't have identification or sensitivities yet but she has improved on Rocephin. Skilled care facility treatment has been recommended Principal Problem:   Altered mental status Active Problems:   Urinary tract infection, site not specified   Acute delirium   Essential hypertension, benign   Urinary incontinence   History of CVA (cerebrovascular accident) - by CT scan    Plan: Continue treatments    LOS: 3 days   Marigny Borre L 07/13/2016, 8:36 AM

## 2016-07-13 NOTE — Clinical Social Work Placement (Addendum)
   CLINICAL SOCIAL WORK PLACEMENT  NOTE  Date:  07/13/2016  Patient Details  Name: [name] MRN: [id number] Date of Birth: [birth year]  Clinical Social Work is seeking post-discharge placement for this patient at the [place] level of care (*CSW will initial, date and re-position this form in  chart as items are completed):  Yes   Patient/family provided with [place] Work Department's list of facilities offering this level of care within the geographic area requested by the patient (or if unable, by the patient's family).  Yes   Patient/family informed of their freedom to choose among providers that offer the needed level of care, that participate in Medicare, Medicaid or managed care program needed by the patient, have an available bed and are willing to accept the patient.  Yes   Patient/family informed of [facility]'s ownership interest in [facility] and [facility], as well as of the fact that they are under no obligation to receive care at these facilities.  PASRR submitted to EDS on       PASRR number received on       Existing PASRR number confirmed on 07/12/16     FL2 transmitted to all facilities in geographic area requested by pt/family on 07/13/16     FL2 transmitted to all facilities within larger geographic area on       Patient informed that his/her managed care company has contracts with or will negotiate with certain facilities, including the following:            Patient/family informed of bed offers received.  07/13/2016   Patient chooses bed at     [facility]  Physician recommends and patient chooses bed at      [facility] Patient to be transferred to   on  .  07/13/2016   Patient to be transferred to facility by     [facility] staff  Patient family notified on   of transfer. Daughter [name].  Name of family member notified:        PHYSICIAN       Additional Comment:     _______________________________________________ Ihor Gully, LCSW 07/13/2016, 10:08 AM

## 2016-07-13 NOTE — Care Management Note (Signed)
Case Management Note  Patient Details  Name: [name] MRN: [id number] Date of Birth: 1920-10-08  Subjective/Objective:                  Admitted with AMS. Pt from home, PT has recommended SNF.   Action/Plan: Pt discharging today. CSW making arrangements for SNF. No CM needs.   Expected Discharge Date:    07/13/2016              Expected Discharge Plan:  [place]  In-House Referral:  Clinical Social Work  Discharge planning Services  CM Consult  Post Acute Care Choice:  NA Choice offered to:  NA  Status of Service:  Completed, signed off  Sherald Barge, RN 07/13/2016, 10:12 AM

## 2016-07-13 NOTE — Progress Notes (Signed)
Discharge instructions gone over with family, verbalized understanding. Report called into [facility]. IV removed, patient tolerated procedure well.

## 2016-07-13 NOTE — Discharge Summary (Signed)
Physician Discharge Summary  Patient ID: [name] MRN: [id number] DOB/AGE: 01-18-1920 83 y.o. Primary Care Physician:Steffon Gladu, Percell Miller, MD Admit date: 07/10/2016 Discharge date: 07/13/2016    Discharge Diagnoses:   Principal Problem:   Altered mental status Active Problems:   Urinary tract infection, site not specified   Acute delirium   Essential hypertension, benign   Urinary incontinence   History of CVA (cerebrovascular accident) - by CT scan Metabolic encephalopathy from urinary tract infection  Allergies as of 07/13/2016      Reactions   Sulfa Antibiotics       Medication List    TAKE these medications   acetaminophen 500 MG tablet Commonly known as:  TYLENOL Take 1,000 mg by mouth at bedtime.   amLODipine-benazepril 10-20 MG capsule Commonly known as:  LOTREL Take 1 capsule by mouth daily.   cefUROXime 250 MG tablet Commonly known as:  CEFTIN Take 1 tablet (250 mg total) by mouth 2 (two) times daily.   diazepam 5 MG tablet Commonly known as:  VALIUM Take 5 mg by mouth every 8 (eight) hours as needed for anxiety.   oxybutynin 10 MG 24 hr tablet Commonly known as:  DITROPAN-XL Take 10 mg by mouth daily.   sertraline 50 MG tablet Commonly known as:  ZOLOFT Take 50 mg by mouth daily.       Discharged Condition:Improved    Consults: None  Significant Diagnostic Studies: Dg Chest 1 View  Result Date: 07/10/2016 CLINICAL DATA:  Altered mental status.  Confusion. EXAM: CHEST 1 VIEW COMPARISON:  07/11/2014 FINDINGS: The heart size and mediastinal contours are within normal limits. Both lungs are clear. The visualized skeletal structures are unremarkable. IMPRESSION: No active disease. Electronically Signed   By: Lorriane Shire M.D.   On: 07/10/2016 16:17   Ct Head Wo Contrast  Result Date: 07/10/2016 CLINICAL DATA:  Family visited pt today and pt did recognize them. Per family pt states she hears someone singing . Pt lives alone. Friends come to  check on pt. Family concerned if pt has been taking her medicines. truncated* EXAM: CT HEAD WITHOUT CONTRAST TECHNIQUE: Contiguous axial images were obtained from the base of the skull through the vertex without intravenous contrast. COMPARISON:  None. FINDINGS: Brain: No evidence of acute infarction, hemorrhage, hydrocephalus, extra-axial collection or mass lesion/mass effect. Left anterior frontal lobe and right cerebellar old infarcts. Small probable old occipital lobe infarcts. Old left basal ganglia lacune infarcts. Right caudate nucleus lacune infarct. Patchy areas of white matter hypoattenuation consistent with mild to moderate chronic microvascular ischemic change. These findings are stable. Vascular: No hyperdense vessel or unexpected calcification. Skull: Normal. Negative for fracture or focal lesion. Sinuses/Orbits: No acute finding. Other: None IMPRESSION: 1. No acute intracranial abnormalities. 2. Multiple old infarcts and chronic microvascular ischemic change. Electronically Signed   By: Lajean Manes M.D.   On: 07/10/2016 16:13    Lab Results: Basic Metabolic Panel:  Recent Labs  07/10/16 1537 07/11/16 0846  NA 143 140  K 3.8 3.8  CL 107 106  CO2 25 26  GLUCOSE 107* 106*  BUN 21* 14  CREATININE 1.05* 0.81  CALCIUM 9.6 9.1   Liver Function Tests:  Recent Labs  07/10/16 1537  AST 18  ALT 11*  ALKPHOS 89  BILITOT 1.1  PROT 8.2*  ALBUMIN 4.9     CBC:  Recent Labs  07/10/16 1537 07/11/16 0846  WBC 6.9 8.2  HGB 13.5 13.4  HCT 40.7 40.7  MCV 91.9 92.3  PLT 230 229    Recent Results (from the past 240 hour(s))  Urine Culture     Status: Abnormal (Preliminary result)   Collection Time: 07/10/16  4:44 PM  Result Value Ref Range Status   Specimen Description URINE, CATHETERIZED  Final   Special Requests NONE  Final   Culture >=100,000 COLONIES/mL GRAM NEGATIVE RODS (A)  Final   Report Status PENDING  Incomplete     Hospital Course: This is a 83 year old who  came to the emergency department because of confusion. She was found to have what appeared to be a urinary tract infection. Cath urine is growing greater than 100,000 colonies of gram-negative rods but we don't have identification or sensitivities yet. She is very hard of hearing which makes communication difficult. She had PT evaluation and it was suggested that she go to skilled care facility. She is remarkably improved much less confused but still with difficult communication.  Discharge Exam: Blood pressure (!) 184/87, pulse (!) 104, temperature 97.9 F (36.6 C), temperature source Oral, resp. rate 20, height 5\' 1"  (1.549 m), weight 48.4 kg (106 lb 11.6 oz), SpO2 95 %. She is awake and alert. She is hard of hearing. Her chest is clear. Her heart is regular  Disposition: To skilled care facility. Since she responded to Rocephin and I will put her on Ceftin with plans to change that once we get her sensitivities if we need to.      Signed: Aryam Zhan L   07/13/2016, 8:40 AM

## 2016-07-13 NOTE — Progress Notes (Addendum)
LCSW following for disposition:  New SNF.  [facility] agreeable to accept patient for discharge today to SNF. Call placed to [facility] [employer] with regards to authorization.  Message left for on call worker regarding bed choice of [place].  All clinical documents sent via HUB for facility.   Will await authorization from insurance company prior to admission. Once obtained, patient can transfer to facility by hospital staff.  LCSW will update daughter once the above have been completed as well as RN taking care of patient. 4:07 PM Insurance obtained [id number] authorized for next 7 days per Santiago Glad at [employer] advantage. Call placed to daughter [name] and updated regarding plan and bed at [facility].  Daughter agreeable to plan. RN called and made aware of plan. Message left for Tami at facility regarding insurance.    Lane Hacker, MSW Clinical Social Work: [employer] Coverage for :  [phone]

## 2016-07-14 LAB — URINE CULTURE

## 2016-07-17 DIAGNOSIS — R279 Unspecified lack of coordination: Secondary | ICD-10-CM | POA: Diagnosis not present

## 2016-07-17 DIAGNOSIS — M810 Age-related osteoporosis without current pathological fracture: Secondary | ICD-10-CM | POA: Diagnosis not present

## 2016-07-17 DIAGNOSIS — B961 Klebsiella pneumoniae [K. pneumoniae] as the cause of diseases classified elsewhere: Secondary | ICD-10-CM | POA: Diagnosis not present

## 2016-07-17 DIAGNOSIS — R1312 Dysphagia, oropharyngeal phase: Secondary | ICD-10-CM | POA: Diagnosis not present

## 2016-07-17 DIAGNOSIS — I10 Essential (primary) hypertension: Secondary | ICD-10-CM | POA: Diagnosis not present

## 2016-07-17 DIAGNOSIS — I639 Cerebral infarction, unspecified: Secondary | ICD-10-CM | POA: Diagnosis not present

## 2016-07-17 DIAGNOSIS — M6281 Muscle weakness (generalized): Secondary | ICD-10-CM | POA: Diagnosis not present

## 2016-07-17 DIAGNOSIS — N3281 Overactive bladder: Secondary | ICD-10-CM | POA: Diagnosis not present

## 2016-07-17 DIAGNOSIS — R4182 Altered mental status, unspecified: Secondary | ICD-10-CM | POA: Diagnosis not present

## 2016-07-17 DIAGNOSIS — F411 Generalized anxiety disorder: Secondary | ICD-10-CM | POA: Diagnosis not present

## 2016-07-17 DIAGNOSIS — N390 Urinary tract infection, site not specified: Secondary | ICD-10-CM | POA: Diagnosis not present

## 2016-07-17 DIAGNOSIS — I739 Peripheral vascular disease, unspecified: Secondary | ICD-10-CM | POA: Diagnosis not present

## 2016-07-17 DIAGNOSIS — H9190 Unspecified hearing loss, unspecified ear: Secondary | ICD-10-CM | POA: Diagnosis not present

## 2016-07-21 DIAGNOSIS — R4182 Altered mental status, unspecified: Secondary | ICD-10-CM | POA: Diagnosis not present

## 2016-07-21 DIAGNOSIS — N390 Urinary tract infection, site not specified: Secondary | ICD-10-CM | POA: Diagnosis not present

## 2016-07-22 NOTE — H&P (Signed)
[name] MRN: [id number] DOB/AGE: [id number] 81 y.o. Primary Care Physician:Ayrianna Mcginniss, Percell Miller, MD Admit date: 07/13/2016 Chief Complaint: For rehabilitation HPI: This is documentation of history and physical performed at the skilled care facility on 07/21/2016. It was not dictated on 07/21/2016 because dragon is not available at the skilled care facility. When I arrived she was sitting out in the hall in a wheelchair. I examined her in her room. She is very hard of hearing so it's difficult to tell she understands what I'm telling her. She is still confused. No new complaints. No chest pain nausea vomiting diarrhea but as mentioned that history is questionable  Past Medical History:  Diagnosis Date  . Anxiety   . Hypertension   . Overactive bladder   . Stroke [facility])    Past Surgical History:  Procedure Laterality Date  . HIP ADDUCTOR TENOTOMY    . JOINT REPLACEMENT          No family history on file.  Based on records officer is a family history of hypertension and stroke  Social History:  reports that she has never smoked. She has never used smokeless tobacco. She reports that she does not drink alcohol or use drugs.   Allergies:  Allergies  Allergen Reactions  . Sulfa Antibiotics     Medications Prior to Admission  Medication Sig Dispense Refill  . acetaminophen (TYLENOL) 500 MG tablet Take 1,000 mg by mouth at bedtime.     Marland Kitchen amLODipine-benazepril (LOTREL) 10-20 MG capsule Take 1 capsule by mouth daily.     . cefUROXime (CEFTIN) 250 MG tablet Take 1 tablet (250 mg total) by mouth 2 (two) times daily. 20 tablet 0  . diazepam (VALIUM) 5 MG tablet Take 5 mg by mouth every 8 (eight) hours as needed for anxiety.     Marland Kitchen oxybutynin (DITROPAN-XL) 10 MG 24 hr tablet Take 10 mg by mouth daily.    . sertraline (ZOLOFT) 50 MG tablet Take 50 mg by mouth daily.          ZJQ:BHALP from the symptoms mentioned above,there are no other symptoms referable to all systems  reviewed.Review of systems is likely not reliable  Physical Exam: There were no vitals taken for this visit. Constitutional: She is awake and alert and in no acute distress. Eyes: Pupils react ears nose mouth and throat: Her throat is clear. Mucous membranes are moist. She is very hard of hearing. Cardiovascular: Her heart is regular with normal heart sounds. Respiratory: Her respiratory effort is normal and her lungs are clear. Gastrointestinal: Her abdomen is soft with no masses skin: Warm and dry musculoskeletal: She is weak in the lower extremities bilaterally. Neurological: Difficult to assess because of her hearing deficit. Psychiatric: She is very confused   No results for input(s): WBC, NEUTROABS, HGB, HCT, MCV, PLT in the last 72 hours. No results for input(s): NA, K, CL, CO2, GLUCOSE, BUN, CREATININE, CALCIUM, MG in the last 72 hours.  Invalid input(s): PHOlablast2(ast:2,ALT:2,alkphos:2,bilitot:2,prot:2,albumin:2)@    No results found for this or any previous visit (from the past 240 hour(s)).   Dg Chest 1 View  Result Date: 07/10/2016 CLINICAL DATA:  Altered mental status.  Confusion. EXAM: CHEST 1 VIEW COMPARISON:  07/11/2014 FINDINGS: The heart size and mediastinal contours are within normal limits. Both lungs are clear. The visualized skeletal structures are unremarkable. IMPRESSION: No active disease. Electronically Signed   By: Lorriane Shire M.D.   On: 07/10/2016 16:17   Ct Head Wo Contrast  Result Date:  07/10/2016 CLINICAL DATA:  Family visited pt today and pt did recognize them. Per family pt states she hears someone singing . Pt lives alone. Friends come to check on pt. Family concerned if pt has been taking her medicines. truncated* EXAM: CT HEAD WITHOUT CONTRAST TECHNIQUE: Contiguous axial images were obtained from the base of the skull through the vertex without intravenous contrast. COMPARISON:  None. FINDINGS: Brain: No evidence of acute infarction, hemorrhage,  hydrocephalus, extra-axial collection or mass lesion/mass effect. Left anterior frontal lobe and right cerebellar old infarcts. Small probable old occipital lobe infarcts. Old left basal ganglia lacune infarcts. Right caudate nucleus lacune infarct. Patchy areas of white matter hypoattenuation consistent with mild to moderate chronic microvascular ischemic change. These findings are stable. Vascular: No hyperdense vessel or unexpected calcification. Skull: Normal. Negative for fracture or focal lesion. Sinuses/Orbits: No acute finding. Other: None IMPRESSION: 1. No acute intracranial abnormalities. 2. Multiple old infarcts and chronic microvascular ischemic change. Electronically Signed   By: Lajean Manes M.D.   On: 07/10/2016 16:13   Impression: She was admitted with deconditioning. She is undergoing physical therapy. She had a urinary tract infection and she has finished treatment for that. She has history of previous strokes but nothing recently. Active Problems:   * No active hospital problems. *     Plan: Continue efforts at rehabilitation. I'm not sure she's ever going to be able to go back home and live independently      [place] L   07/22/2016, 8:41 AM

## 2016-08-11 DIAGNOSIS — I639 Cerebral infarction, unspecified: Secondary | ICD-10-CM | POA: Diagnosis not present

## 2016-08-11 DIAGNOSIS — R296 Repeated falls: Secondary | ICD-10-CM | POA: Diagnosis not present

## 2016-08-11 DIAGNOSIS — M810 Age-related osteoporosis without current pathological fracture: Secondary | ICD-10-CM | POA: Diagnosis not present

## 2016-08-11 DIAGNOSIS — J189 Pneumonia, unspecified organism: Secondary | ICD-10-CM | POA: Diagnosis not present

## 2016-08-11 DIAGNOSIS — I10 Essential (primary) hypertension: Secondary | ICD-10-CM | POA: Diagnosis not present

## 2016-08-18 DIAGNOSIS — Z8701 Personal history of pneumonia (recurrent): Secondary | ICD-10-CM | POA: Diagnosis not present

## 2016-08-18 DIAGNOSIS — Z8673 Personal history of transient ischemic attack (TIA), and cerebral infarction without residual deficits: Secondary | ICD-10-CM | POA: Diagnosis not present

## 2016-08-18 DIAGNOSIS — M810 Age-related osteoporosis without current pathological fracture: Secondary | ICD-10-CM | POA: Diagnosis not present

## 2016-08-18 DIAGNOSIS — Z87440 Personal history of urinary (tract) infections: Secondary | ICD-10-CM | POA: Diagnosis not present

## 2016-08-18 DIAGNOSIS — I10 Essential (primary) hypertension: Secondary | ICD-10-CM | POA: Diagnosis not present

## 2016-08-18 DIAGNOSIS — N390 Urinary tract infection, site not specified: Secondary | ICD-10-CM | POA: Diagnosis not present

## 2016-08-18 DIAGNOSIS — R296 Repeated falls: Secondary | ICD-10-CM | POA: Diagnosis not present

## 2016-09-06 DIAGNOSIS — N390 Urinary tract infection, site not specified: Secondary | ICD-10-CM | POA: Diagnosis not present

## 2016-09-20 DIAGNOSIS — R296 Repeated falls: Secondary | ICD-10-CM | POA: Diagnosis not present

## 2016-09-20 DIAGNOSIS — Z8701 Personal history of pneumonia (recurrent): Secondary | ICD-10-CM | POA: Diagnosis not present

## 2016-09-20 DIAGNOSIS — M810 Age-related osteoporosis without current pathological fracture: Secondary | ICD-10-CM | POA: Diagnosis not present

## 2016-09-20 DIAGNOSIS — N390 Urinary tract infection, site not specified: Secondary | ICD-10-CM | POA: Diagnosis not present

## 2016-09-20 DIAGNOSIS — Z87440 Personal history of urinary (tract) infections: Secondary | ICD-10-CM | POA: Diagnosis not present

## 2016-09-20 DIAGNOSIS — Z8673 Personal history of transient ischemic attack (TIA), and cerebral infarction without residual deficits: Secondary | ICD-10-CM | POA: Diagnosis not present

## 2016-09-20 DIAGNOSIS — I10 Essential (primary) hypertension: Secondary | ICD-10-CM | POA: Diagnosis not present

## 2016-09-27 DIAGNOSIS — F321 Major depressive disorder, single episode, moderate: Secondary | ICD-10-CM | POA: Diagnosis not present

## 2016-09-27 DIAGNOSIS — Z8701 Personal history of pneumonia (recurrent): Secondary | ICD-10-CM | POA: Diagnosis not present

## 2016-09-27 DIAGNOSIS — Z87440 Personal history of urinary (tract) infections: Secondary | ICD-10-CM | POA: Diagnosis not present

## 2016-09-27 DIAGNOSIS — N3281 Overactive bladder: Secondary | ICD-10-CM | POA: Diagnosis not present

## 2016-09-27 DIAGNOSIS — I10 Essential (primary) hypertension: Secondary | ICD-10-CM | POA: Diagnosis not present

## 2016-09-27 DIAGNOSIS — M810 Age-related osteoporosis without current pathological fracture: Secondary | ICD-10-CM | POA: Diagnosis not present

## 2016-09-27 DIAGNOSIS — Z8673 Personal history of transient ischemic attack (TIA), and cerebral infarction without residual deficits: Secondary | ICD-10-CM | POA: Diagnosis not present

## 2016-09-27 DIAGNOSIS — R296 Repeated falls: Secondary | ICD-10-CM | POA: Diagnosis not present

## 2016-11-11 DIAGNOSIS — Z23 Encounter for immunization: Secondary | ICD-10-CM | POA: Diagnosis not present

## 2017-01-16 DIAGNOSIS — N3281 Overactive bladder: Secondary | ICD-10-CM | POA: Diagnosis not present

## 2017-01-16 DIAGNOSIS — F419 Anxiety disorder, unspecified: Secondary | ICD-10-CM | POA: Diagnosis not present

## 2017-01-16 DIAGNOSIS — M1990 Unspecified osteoarthritis, unspecified site: Secondary | ICD-10-CM | POA: Diagnosis not present

## 2017-01-16 DIAGNOSIS — I10 Essential (primary) hypertension: Secondary | ICD-10-CM | POA: Diagnosis not present

## 2017-01-24 ENCOUNTER — Encounter (HOSPITAL_COMMUNITY): Payer: Self-pay | Admitting: *Deleted

## 2017-01-24 ENCOUNTER — Emergency Department (HOSPITAL_COMMUNITY)
Admission: EM | Admit: 2017-01-24 | Discharge: 2017-01-24 | Disposition: A | Discharge status: Home / Self Care | Payer: PPO | Attending: Emergency Medicine | Admitting: Emergency Medicine

## 2017-01-24 DIAGNOSIS — Z882 Allergy status to sulfonamides status: Secondary | ICD-10-CM | POA: Diagnosis not present

## 2017-01-24 DIAGNOSIS — F0390 Unspecified dementia without behavioral disturbance: Secondary | ICD-10-CM | POA: Diagnosis not present

## 2017-01-24 DIAGNOSIS — R1110 Vomiting, unspecified: Secondary | ICD-10-CM

## 2017-01-24 DIAGNOSIS — Z79899 Other long term (current) drug therapy: Secondary | ICD-10-CM | POA: Insufficient documentation

## 2017-01-24 DIAGNOSIS — Z8673 Personal history of transient ischemic attack (TIA), and cerebral infarction without residual deficits: Secondary | ICD-10-CM | POA: Insufficient documentation

## 2017-01-24 DIAGNOSIS — R6883 Chills (without fever): Secondary | ICD-10-CM | POA: Insufficient documentation

## 2017-01-24 DIAGNOSIS — I10 Essential (primary) hypertension: Secondary | ICD-10-CM | POA: Diagnosis not present

## 2017-01-24 DIAGNOSIS — R112 Nausea with vomiting, unspecified: Secondary | ICD-10-CM | POA: Insufficient documentation

## 2017-01-24 HISTORY — DX: Unspecified dementia, unspecified severity, without behavioral disturbance, psychotic disturbance, mood disturbance, and anxiety: F03.90

## 2017-01-24 HISTORY — DX: Pneumonia, unspecified organism: J18.9

## 2017-01-24 LAB — COMPREHENSIVE METABOLIC PANEL
ALT: 11 U/L — ABNORMAL LOW (ref 14–54)
AST: 19 U/L (ref 15–41)
Albumin: 4.5 g/dL (ref 3.5–5.0)
Alkaline Phosphatase: 75 U/L (ref 38–126)
Anion gap: 12 (ref 5–15)
BUN: 24 mg/dL — ABNORMAL HIGH (ref 6–20)
CO2: 25 mmol/L (ref 22–32)
Calcium: 9.2 mg/dL (ref 8.9–10.3)
Chloride: 100 mmol/L — ABNORMAL LOW (ref 101–111)
Creatinine, Ser: 0.92 mg/dL (ref 0.44–1.00)
GFR calc Af Amer: 60 mL/min — ABNORMAL LOW (ref >60)
GFR calc non Af Amer: 52 mL/min — ABNORMAL LOW (ref >60)
Glucose, Bld: 138 mg/dL — ABNORMAL HIGH (ref 65–99)
Potassium: 4.5 mmol/L (ref 3.5–5.1)
Sodium: 137 mmol/L (ref 135–145)
Total Bilirubin: 1.6 mg/dL — ABNORMAL HIGH (ref 0.3–1.2)
Total Protein: 7.5 g/dL (ref 6.5–8.1)

## 2017-01-24 LAB — CBC
HCT: 44.2 % (ref 36.0–46.0)
Hemoglobin: 14.2 g/dL (ref 12.0–15.0)
MCH: 30.4 pg (ref 26.0–34.0)
MCHC: 32.1 g/dL (ref 30.0–36.0)
MCV: 94.6 fL (ref 78.0–100.0)
Platelets: 207 10*3/uL (ref 150–400)
RBC: 4.67 MIL/uL (ref 3.87–5.11)
RDW: 13.3 % (ref 11.5–15.5)
WBC: 11.4 10*3/uL — ABNORMAL HIGH (ref 4.0–10.5)

## 2017-01-24 LAB — LIPASE, BLOOD: Lipase: 38 U/L (ref 11–51)

## 2017-01-24 MED ORDER — ONDANSETRON HCL 4 MG/2ML IJ SOLN
4.0000 mg | Freq: Once | INTRAMUSCULAR | Status: AC
  Administered 2017-01-24: 4 mg via INTRAVENOUS
  Filled 2017-01-24: qty 2

## 2017-01-24 MED ORDER — ONDANSETRON 4 MG PO TBDP
ORAL_TABLET | ORAL | Status: AC
  Filled 2017-01-24: qty 1

## 2017-01-24 MED ORDER — LACTATED RINGERS IV BOLUS (SEPSIS)
500.0000 mL | Freq: Once | INTRAVENOUS | Status: AC
  Administered 2017-01-24: 500 mL via INTRAVENOUS

## 2017-01-24 MED ORDER — ONDANSETRON 4 MG PO TBDP
4.0000 mg | ORAL_TABLET | Freq: Once | ORAL | Status: AC | PRN
  Administered 2017-01-24: 4 mg via ORAL

## 2017-01-24 NOTE — ED Provider Notes (Signed)
[facility] EMERGENCY DEPARTMENT Provider Note   CSN: [id number] Arrival date & time: 01/24/17  1124     History   Chief Complaint Chief Complaint  Patient presents with  . Emesis    HPI [name] is a 84 y.o. female.  HPI   84 year old female with nausea and vomiting.  She is coming from a nursing facility.  Apparently there are several other residents with similar symptoms recently.  She denies any abdominal pain.  No diarrhea.  She has felt chilled at times.  No reported fever.  She says she has an overactive bladder, but no acute urinary complaints.  It is not listed in her surgical history, however she reports that she has had a cholecystectomy.  Past Medical History:  Diagnosis Date  . Anxiety   . Dementia   . Hypertension   . Overactive bladder   . Pneumonia   . Stroke [facility])     Patient Active Problem List   Diagnosis Date Noted  . Altered mental status 07/10/2016  . Urinary incontinence 07/10/2016  . History of CVA (cerebrovascular accident) - by CT scan 07/10/2016  . Compression fracture of L2 lumbar vertebra (HCC) 11/25/2015  . Compression fracture of spine, non-traumatic ([place]) 11/25/2015  . Osteoporosis 11/25/2015  . Peripheral arterial disease ([place]) 11/25/2015  . Pyelonephritis, acute 11/23/2015  . UTI (urinary tract infection) 11/23/2015  . Community acquired pneumonia 07/11/2014  . Urinary tract infection, site not specified 03/01/2012  . Acute delirium 03/01/2012  . Essential hypertension, benign 03/01/2012  . Hypokalemia 03/01/2012    Past Surgical History:  Procedure Laterality Date  . HIP ADDUCTOR TENOTOMY    . JOINT REPLACEMENT      OB History    No data available       Home Medications    Prior to Admission medications   Medication Sig Start Date End Date Taking? Authorizing Provider  acetaminophen (TYLENOL) 500 MG tablet Take 1,000 mg by mouth at bedtime.     [provider]  amLODipine-benazepril (LOTREL) 10-20 MG  capsule Take 1 capsule by mouth daily.  06/27/16   [provider]  diazepam (VALIUM) 5 MG tablet Take 5 mg by mouth every 8 (eight) hours as needed for anxiety.  06/27/16   [provider]  oxybutynin (DITROPAN-XL) 10 MG 24 hr tablet Take 10 mg by mouth daily.    [provider]  sertraline (ZOLOFT) 50 MG tablet Take 50 mg by mouth daily.  06/24/16   [provider]    Family History History reviewed. No pertinent family history.  Social History Social History   Tobacco Use  . Smoking status: Never Smoker  . Smokeless tobacco: Never Used  Substance Use Topics  . Alcohol use: No  . Drug use: No     Allergies   Sulfa antibiotics   Review of Systems Review of Systems  All systems reviewed and negative, other than as noted in HPI.  Physical Exam Updated Vital Signs BP (!) 146/83 (BP Location: Right Arm)   Pulse 91   Temp 98.3 F (36.8 C) (Oral)   Resp 16   Ht 5\' 2"  (1.575 m)   Wt 48.1 kg (106 lb)   SpO2 95%   BMI 19.39 kg/m   Physical Exam  Constitutional: She appears well-developed and well-nourished. No distress.  Laying in bed.  No acute distress. Appears well for her age.   Hard of hearing.  HENT:  Head: Normocephalic and atraumatic.  Eyes: Conjunctivae  are normal. Right eye exhibits no discharge. Left eye exhibits no discharge.  Neck: Neck supple.  Cardiovascular: Normal rate, regular rhythm and normal heart sounds. Exam reveals no gallop and no friction rub.  No murmur heard. Pulmonary/Chest: Effort normal and breath sounds normal. No respiratory distress.  Abdominal: Soft. She exhibits no distension. There is no tenderness.  Musculoskeletal: She exhibits no edema or tenderness.  Neurological: She is alert.  Skin: Skin is warm and dry.  Psychiatric: She has a normal mood and affect. Her behavior is normal. Thought content normal.  Nursing note and vitals reviewed.    ED Treatments / Results  Labs (all labs ordered are  listed, but only abnormal results are displayed) Labs Reviewed  COMPREHENSIVE METABOLIC PANEL - Abnormal; Notable for the following components:      Result Value   Chloride 100 (*)    Glucose, Bld 138 (*)    BUN 24 (*)    ALT 11 (*)    Total Bilirubin 1.6 (*)    GFR calc non Af Amer 52 (*)    GFR calc Af Amer 60 (*)    All other components within normal limits  CBC - Abnormal; Notable for the following components:   WBC 11.4 (*)    All other components within normal limits  LIPASE, BLOOD    EKG  EKG Interpretation None       Radiology No results found.  Procedures Procedures (including critical care time)  Medications Ordered in ED Medications  ondansetron (ZOFRAN-ODT) 4 MG disintegrating tablet (not administered)  lactated ringers bolus 500 mL (not administered)  ondansetron (ZOFRAN) injection 4 mg (not administered)  ondansetron (ZOFRAN-ODT) disintegrating tablet 4 mg (4 mg Oral Given 01/24/17 1202)     Initial Impression / Assessment and Plan / ED Course  I have reviewed the triage vital signs and the nursing notes.  Pertinent labs & imaging results that were available during my care of the patient were reviewed by me and considered in my medical decision making (see chart for details).  Clinical Course as of Jan 24 1610  Tue Jan 24, 2017  1506 Pt has asked me several times to leave. Admittedly, she looks very well for age. Abdominal exam is benign. Apparently other residents with GI symptoms. She agreed to and received 500cc of IVF. She has no urinary complaints and UA canceled.   [SK]    Clinical Course User Index [SK] Virgel Manifold, MD    84 year old female with nausea and vomiting.  Benign abdominal exam.  Healing apically stable.  No vomiting in the ED.  Apparently other residents in her facility with similar symptoms recently. Suspect viral GI illness.   Final Clinical Impressions(s) / ED Diagnoses   Final diagnoses:  Vomiting, intractability of  vomiting not specified, presence of nausea not specified, unspecified vomiting type    ED Discharge Orders    None       Virgel Manifold, MD 01/24/17 [phone]

## 2017-01-24 NOTE — ED Triage Notes (Signed)
Pt from [facility], emesis for 2 days, no diarrhea.  Virus going around the facility.

## 2017-01-24 NOTE — ED Notes (Signed)
Spoke with Tiffany at [facility].  "Someone is on the way to pick Pt up".  Nurse informed.

## 2017-06-02 DIAGNOSIS — N3281 Overactive bladder: Secondary | ICD-10-CM | POA: Diagnosis not present

## 2017-06-02 DIAGNOSIS — M1990 Unspecified osteoarthritis, unspecified site: Secondary | ICD-10-CM | POA: Diagnosis not present

## 2017-06-02 DIAGNOSIS — I10 Essential (primary) hypertension: Secondary | ICD-10-CM | POA: Diagnosis not present

## 2017-06-02 DIAGNOSIS — E785 Hyperlipidemia, unspecified: Secondary | ICD-10-CM | POA: Diagnosis not present

## 2017-12-01 DIAGNOSIS — M1990 Unspecified osteoarthritis, unspecified site: Secondary | ICD-10-CM | POA: Diagnosis not present

## 2017-12-01 DIAGNOSIS — Z23 Encounter for immunization: Secondary | ICD-10-CM | POA: Diagnosis not present

## 2017-12-01 DIAGNOSIS — I10 Essential (primary) hypertension: Secondary | ICD-10-CM | POA: Diagnosis not present

## 2017-12-01 DIAGNOSIS — F419 Anxiety disorder, unspecified: Secondary | ICD-10-CM | POA: Diagnosis not present

## 2017-12-17 ENCOUNTER — Emergency Department (HOSPITAL_COMMUNITY): Payer: PPO

## 2017-12-17 ENCOUNTER — Encounter (HOSPITAL_COMMUNITY): Payer: Self-pay | Admitting: *Deleted

## 2017-12-17 ENCOUNTER — Inpatient Hospital Stay (HOSPITAL_COMMUNITY)
Admission: EM | Admit: 2017-12-17 | Discharge: 2017-12-21 | DRG: 871 | Disposition: A | Discharge status: Other Acute Inpatient Hospital | Payer: PPO | Source: Skilled Nursing Facility | Attending: Pulmonary Disease | Admitting: Pulmonary Disease

## 2017-12-17 DIAGNOSIS — A419 Sepsis, unspecified organism: Secondary | ICD-10-CM | POA: Diagnosis not present

## 2017-12-17 DIAGNOSIS — J181 Lobar pneumonia, unspecified organism: Secondary | ICD-10-CM

## 2017-12-17 DIAGNOSIS — J168 Pneumonia due to other specified infectious organisms: Secondary | ICD-10-CM | POA: Diagnosis not present

## 2017-12-17 DIAGNOSIS — F329 Major depressive disorder, single episode, unspecified: Secondary | ICD-10-CM | POA: Diagnosis not present

## 2017-12-17 DIAGNOSIS — J189 Pneumonia, unspecified organism: Secondary | ICD-10-CM | POA: Diagnosis present

## 2017-12-17 DIAGNOSIS — G9341 Metabolic encephalopathy: Secondary | ICD-10-CM | POA: Diagnosis not present

## 2017-12-17 DIAGNOSIS — J690 Pneumonitis due to inhalation of food and vomit: Secondary | ICD-10-CM | POA: Diagnosis not present

## 2017-12-17 DIAGNOSIS — N3281 Overactive bladder: Secondary | ICD-10-CM | POA: Diagnosis present

## 2017-12-17 DIAGNOSIS — I10 Essential (primary) hypertension: Secondary | ICD-10-CM | POA: Diagnosis present

## 2017-12-17 DIAGNOSIS — Z66 Do not resuscitate: Secondary | ICD-10-CM | POA: Diagnosis present

## 2017-12-17 DIAGNOSIS — R509 Fever, unspecified: Secondary | ICD-10-CM | POA: Diagnosis not present

## 2017-12-17 DIAGNOSIS — Z8673 Personal history of transient ischemic attack (TIA), and cerebral infarction without residual deficits: Secondary | ICD-10-CM | POA: Diagnosis not present

## 2017-12-17 DIAGNOSIS — Y95 Nosocomial condition: Secondary | ICD-10-CM | POA: Diagnosis present

## 2017-12-17 DIAGNOSIS — F0390 Unspecified dementia without behavioral disturbance: Secondary | ICD-10-CM | POA: Diagnosis present

## 2017-12-17 DIAGNOSIS — Z79899 Other long term (current) drug therapy: Secondary | ICD-10-CM | POA: Diagnosis not present

## 2017-12-17 DIAGNOSIS — R6520 Severe sepsis without septic shock: Secondary | ICD-10-CM | POA: Diagnosis present

## 2017-12-17 LAB — COMPREHENSIVE METABOLIC PANEL
ALBUMIN: 4.4 g/dL (ref 3.5–5.0)
ALT: 11 U/L (ref 0–44)
ANION GAP: 9 (ref 5–15)
AST: 17 U/L (ref 15–41)
Alkaline Phosphatase: 80 U/L (ref 38–126)
BUN: 20 mg/dL (ref 8–23)
CHLORIDE: 101 mmol/L (ref 98–111)
CO2: 23 mmol/L (ref 22–32)
Calcium: 8.8 mg/dL — ABNORMAL LOW (ref 8.9–10.3)
Creatinine, Ser: 0.98 mg/dL (ref 0.44–1.00)
GFR calc Af Amer: 57 mL/min — ABNORMAL LOW (ref >60)
GFR calc non Af Amer: 49 mL/min — ABNORMAL LOW (ref >60)
GLUCOSE: 122 mg/dL — AB (ref 70–99)
POTASSIUM: 3.7 mmol/L (ref 3.5–5.1)
Sodium: 133 mmol/L — ABNORMAL LOW (ref 135–145)
TOTAL PROTEIN: 8 g/dL (ref 6.5–8.1)
Total Bilirubin: 1.4 mg/dL — ABNORMAL HIGH (ref 0.3–1.2)

## 2017-12-17 LAB — CBC WITH DIFFERENTIAL/PLATELET
ABS IMMATURE GRANULOCYTES: 0.14 10*3/uL — AB (ref 0.00–0.07)
BASOS PCT: 0 %
Basophils Absolute: 0.1 10*3/uL (ref 0.0–0.1)
EOS ABS: 0 10*3/uL (ref 0.0–0.5)
EOS PCT: 0 %
HCT: 40.3 % (ref 36.0–46.0)
Hemoglobin: 13.1 g/dL (ref 12.0–15.0)
Immature Granulocytes: 1 %
Lymphocytes Relative: 5 %
Lymphs Abs: 0.9 10*3/uL (ref 0.7–4.0)
MCH: 30.3 pg (ref 26.0–34.0)
MCHC: 32.5 g/dL (ref 30.0–36.0)
MCV: 93.3 fL (ref 80.0–100.0)
MONO ABS: 1.5 10*3/uL — AB (ref 0.1–1.0)
MONOS PCT: 8 %
Neutro Abs: 17.4 10*3/uL — ABNORMAL HIGH (ref 1.7–7.7)
Neutrophils Relative %: 86 %
PLATELETS: 255 10*3/uL (ref 150–400)
RBC: 4.32 MIL/uL (ref 3.87–5.11)
RDW: 12.4 % (ref 11.5–15.5)
WBC: 20.1 10*3/uL — ABNORMAL HIGH (ref 4.0–10.5)
nRBC: 0 % (ref 0.0–0.2)

## 2017-12-17 LAB — I-STAT CG4 LACTIC ACID, ED
LACTIC ACID, VENOUS: 1.55 mmol/L (ref 0.5–1.9)
LACTIC ACID, VENOUS: 0.69 mmol/L (ref 0.5–1.9)

## 2017-12-17 MED ORDER — POLYETHYLENE GLYCOL 3350 17 G PO PACK: 17.0000 g | PACK | Freq: Every day | ORAL | Status: DC | PRN

## 2017-12-17 MED ORDER — ACETAMINOPHEN 650 MG RE SUPP: 650.0000 mg | Freq: Four times a day (QID) | RECTAL | Status: DC | PRN

## 2017-12-17 MED ORDER — ONDANSETRON HCL 4 MG PO TABS: 4.0000 mg | ORAL_TABLET | Freq: Four times a day (QID) | ORAL | Status: DC | PRN

## 2017-12-17 MED ORDER — ACETAMINOPHEN 325 MG PO TABS
650.0000 mg | ORAL_TABLET | Freq: Four times a day (QID) | ORAL | Status: DC | PRN
  Administered 2017-12-18 – 2017-12-20 (×2): 650 mg via ORAL
  Filled 2017-12-17 (×2): qty 2

## 2017-12-17 MED ORDER — ACETAMINOPHEN 325 MG PO TABS
650.0000 mg | ORAL_TABLET | Freq: Once | ORAL | Status: AC
  Administered 2017-12-17: 650 mg via ORAL
  Filled 2017-12-17: qty 2

## 2017-12-17 MED ORDER — TRAZODONE HCL 50 MG PO TABS
50.0000 mg | ORAL_TABLET | Freq: Every day | ORAL | Status: DC
  Administered 2017-12-17 – 2017-12-20 (×4): 50 mg via ORAL
  Filled 2017-12-17 (×4): qty 1

## 2017-12-17 MED ORDER — SODIUM CHLORIDE 0.9 % IV SOLN
2.0000 g | INTRAVENOUS | Status: DC
  Administered 2017-12-17: 2 g via INTRAVENOUS
  Filled 2017-12-17: qty 20

## 2017-12-17 MED ORDER — SODIUM CHLORIDE 0.9 % IV SOLN
500.0000 mg | INTRAVENOUS | Status: DC
  Administered 2017-12-17 – 2017-12-19 (×3): 500 mg via INTRAVENOUS
  Filled 2017-12-17 (×4): qty 500

## 2017-12-17 MED ORDER — ACETAMINOPHEN 325 MG PO TABS: 650.0000 mg | ORAL_TABLET | Freq: Once | ORAL | Status: DC | PRN

## 2017-12-17 MED ORDER — SODIUM CHLORIDE 0.9 % IV BOLUS
1000.0000 mL | Freq: Once | INTRAVENOUS | Status: AC
  Administered 2017-12-17: 1000 mL via INTRAVENOUS

## 2017-12-17 MED ORDER — PIPERACILLIN-TAZOBACTAM 3.375 G IVPB
3.3750 g | Freq: Three times a day (TID) | INTRAVENOUS | Status: DC
  Administered 2017-12-17 – 2017-12-20 (×8): 3.375 g via INTRAVENOUS
  Filled 2017-12-17 (×8): qty 50

## 2017-12-17 MED ORDER — MIRABEGRON ER 25 MG PO TB24
25.0000 mg | ORAL_TABLET | Freq: Every day | ORAL | Status: DC
  Administered 2017-12-18 – 2017-12-21 (×4): 25 mg via ORAL
  Filled 2017-12-17 (×4): qty 1

## 2017-12-17 MED ORDER — SERTRALINE HCL 50 MG PO TABS
50.0000 mg | ORAL_TABLET | Freq: Every day | ORAL | Status: DC
  Administered 2017-12-18 – 2017-12-21 (×4): 50 mg via ORAL
  Filled 2017-12-17 (×4): qty 1

## 2017-12-17 MED ORDER — POTASSIUM CHLORIDE IN NACL 20-0.9 MEQ/L-% IV SOLN: INTRAVENOUS | Status: DC

## 2017-12-17 MED ORDER — POTASSIUM CHLORIDE IN NACL 20-0.9 MEQ/L-% IV SOLN
INTRAVENOUS | Status: AC
  Administered 2017-12-17 – 2017-12-18 (×2): via INTRAVENOUS

## 2017-12-17 MED ORDER — ENOXAPARIN SODIUM 30 MG/0.3ML ~~LOC~~ SOLN
30.0000 mg | SUBCUTANEOUS | Status: DC
  Administered 2017-12-17 – 2017-12-20 (×4): 30 mg via SUBCUTANEOUS
  Filled 2017-12-17 (×4): qty 0.3

## 2017-12-17 MED ORDER — VANCOMYCIN HCL IN DEXTROSE 1-5 GM/200ML-% IV SOLN
1000.0000 mg | Freq: Once | INTRAVENOUS | Status: AC
  Administered 2017-12-17: 1000 mg via INTRAVENOUS
  Filled 2017-12-17: qty 200

## 2017-12-17 MED ORDER — ONDANSETRON HCL 4 MG/2ML IJ SOLN: 4.0000 mg | Freq: Four times a day (QID) | INTRAMUSCULAR | Status: DC | PRN

## 2017-12-17 NOTE — ED Notes (Signed)
Staff called from [facility] for update pt disposition- informed staff that pt would be admitted for pneumonia.

## 2017-12-17 NOTE — ED Triage Notes (Signed)
Decrease in appetite, fever noted today.  Pt from [facility].

## 2017-12-17 NOTE — ED Provider Notes (Signed)
[facility] EMERGENCY DEPARTMENT Provider Note   CSN: [id number] Arrival date & time: 12/17/17  1609     History   Chief Complaint Chief Complaint  Patient presents with  . Fever    HPI [name] is a 84 y.o. female.  HPI  84 year old female presents with fever.  History is somewhat limited by the patient's prior history of dementia but also her poor hearing.  She states she has not been feeling well for couple days and has had decreased appetite.  She is describing some lower abdominal pain and does feel little short of breath.  No vomiting but she has felt nauseated.  History is otherwise fairly limited.  There is no family here and her nursing facility dropped her off at the front door of the ER. Reportedly she had tylenol just a couple hours ago.  Past Medical History:  Diagnosis Date  . Anxiety   . Dementia ([place])   . Hypertension   . Overactive bladder   . Pneumonia   . Stroke [facility])     Patient Active Problem List   Diagnosis Date Noted  . Pneumonia 12/17/2017  . Altered mental status 07/10/2016  . Urinary incontinence 07/10/2016  . History of CVA (cerebrovascular accident) - by CT scan 07/10/2016  . Compression fracture of L2 lumbar vertebra (HCC) 11/25/2015  . Compression fracture of spine, non-traumatic ([place]) 11/25/2015  . Osteoporosis 11/25/2015  . Peripheral arterial disease ([place]) 11/25/2015  . Pyelonephritis, acute 11/23/2015  . UTI (urinary tract infection) 11/23/2015  . Community acquired pneumonia 07/11/2014  . Urinary tract infection, site not specified 03/01/2012  . Acute delirium 03/01/2012  . Essential hypertension, benign 03/01/2012  . Hypokalemia 03/01/2012    Past Surgical History:  Procedure Laterality Date  . HIP ADDUCTOR TENOTOMY    . JOINT REPLACEMENT       OB History   None      Home Medications    Prior to Admission medications   Medication Sig Start Date End Date Taking? Authorizing Provider  acetaminophen (TYLENOL)  500 MG tablet Take 1,000 mg by mouth at bedtime.    Yes [provider]  amLODipine-benazepril (LOTREL) 10-20 MG capsule Take 1 capsule by mouth daily.  06/27/16  Yes [provider]  conjugated estrogens (PREMARIN) vaginal cream Place 1 Applicatorful vaginally at bedtime.   Yes [provider]  mirabegron ER (MYRBETRIQ) 25 MG TB24 tablet Take 25 mg by mouth daily.   Yes [provider]  sertraline (ZOLOFT) 50 MG tablet Take 50 mg by mouth daily.  06/24/16  Yes [provider]  traZODone (DESYREL) 50 MG tablet Take 50 mg by mouth at bedtime.   Yes [provider]    Family History History reviewed. No pertinent family history.  Social History Social History   Tobacco Use  . Smoking status: Never Smoker  . Smokeless tobacco: Never Used  Substance Use Topics  . Alcohol use: No  . Drug use: No     Allergies   Sulfa antibiotics   Review of Systems Review of Systems  Unable to perform ROS: Dementia     Physical Exam Updated Vital Signs BP (!) 122/55   Pulse 88   Temp (!) 101.2 F (38.4 C) (Oral)   Resp 19   SpO2 91%   Physical Exam  Constitutional: She is oriented to person, place, and time. She appears well-developed and well-nourished. No distress.  HENT:  Head: Normocephalic and atraumatic.  Right Ear: External ear  normal.  Left Ear: External ear normal.  Nose: Nose normal.  Eyes: Right eye exhibits no discharge. Left eye exhibits no discharge.  Cardiovascular: Normal rate, regular rhythm and normal heart sounds.  Pulmonary/Chest: Effort normal. She has rales (left greater than right).  Abdominal: Soft. There is tenderness in the suprapubic area.  Neurological: She is alert and oriented to person, place, and time.  Skin: Skin is warm and dry. She is not diaphoretic.  Psychiatric: Her mood appears not anxious.  Nursing note and vitals reviewed.    ED Treatments / Results  Labs (all labs ordered are listed,  but only abnormal results are displayed) Labs Reviewed  CBC WITH DIFFERENTIAL/PLATELET - Abnormal; Notable for the following components:      Result Value   WBC 20.1 (*)    Neutro Abs 17.4 (*)    Monocytes Absolute 1.5 (*)    Abs Immature Granulocytes 0.14 (*)    All other components within normal limits  COMPREHENSIVE METABOLIC PANEL - Abnormal; Notable for the following components:   Sodium 133 (*)    Glucose, Bld 122 (*)    Calcium 8.8 (*)    Total Bilirubin 1.4 (*)    GFR calc non Af Amer 49 (*)    GFR calc Af Amer 57 (*)    All other components within normal limits  CULTURE, BLOOD (ROUTINE X 2)  CULTURE, BLOOD (ROUTINE X 2)  URINE CULTURE  URINALYSIS, ROUTINE W REFLEX MICROSCOPIC  I-STAT CG4 LACTIC ACID, ED  I-STAT CG4 LACTIC ACID, ED    EKG EKG Interpretation  Date/Time:  Sunday December 17 2017 17:11:06 EST Ventricular Rate:  81 PR Interval:    QRS Duration: 100 QT Interval:  375 QTC Calculation: 436 R Axis:   12 Text Interpretation:  Sinus rhythm Atrial premature complex Borderline prolonged PR interval Borderline low voltage, extremity leads Anteroseptal infarct, old Nonspecific T abnormalities, lateral leads similar to 2016 Confirmed by Sherwood Gambler [phone]) on 12/17/2017 5:35:12 PM   Radiology Dg Chest Port 1 View  Result Date: 12/17/2017 CLINICAL DATA:  Fever EXAM: PORTABLE CHEST 1 VIEW COMPARISON:  07/10/2016 FINDINGS: Mild cardiomegaly without effusion. Mild airspace disease at the peripheral right lung base. No pneumothorax. IMPRESSION: Mild airspace disease at the peripheral right lung base suspicious for a small pneumonia possibly aspiration. Mild cardiomegaly Electronically Signed   By: Donavan Foil M.D.   On: 12/17/2017 18:11    Procedures Procedures (including critical care time)  Medications Ordered in ED Medications  cefTRIAXone (ROCEPHIN) 2 g in sodium chloride 0.9 % 100 mL IVPB (0 g Intravenous Stopped 12/17/17 1800)  azithromycin (ZITHROMAX)  500 mg in sodium chloride 0.9 % 250 mL IVPB (0 mg Intravenous Stopped 12/17/17 1856)  sodium chloride 0.9 % bolus 1,000 mL (1,000 mLs Intravenous New Bag/Given 12/17/17 1722)  acetaminophen (TYLENOL) tablet 650 mg (650 mg Oral Given 12/17/17 1856)     Initial Impression / Assessment and Plan / ED Course  I have reviewed the triage vital signs and the nursing notes.  Pertinent labs & imaging results that were available during my care of the patient were reviewed by me and considered in my medical decision making (see chart for details).     Patient meets sepsis criteria with her fever and elevated WBC.  However she is otherwise not ill-appearing.  With the low sats of 91%, some subjective shortness of breath and the chest x-ray findings, pneumonia is probably causing her symptoms.  In and out cath was initially  performed but no urine obtained.  She then urinated on the bed so urine has been unable to be collected so far.  She was given Rocephin and azithromycin.  She will need admission I have discussed the case with the patient's daughter who agrees.  Hospitalist to admit.  Final Clinical Impressions(s) / ED Diagnoses   Final diagnoses:  Pneumonia of right lower lobe due to infectious organism [facility])    ED Discharge Orders    None       Sherwood Gambler, MD 12/17/17 1945

## 2017-12-17 NOTE — ED Notes (Signed)
Reported that pt had tylenol around 1300-1400 today for fever

## 2017-12-17 NOTE — H&P (Signed)
History and Physical    [name] IDP:[id number] DOB: [birth year] DOA: 12/17/2017  PCP: Sinda Du, MD   Patient coming from: [facility]  Chief Complaint: Fever   HPI: [name] is a 84 y.o. female with medical history significant for Dementia, PAD, CVA, HTN, was brought in by EMS with reports of fever- 102. Also reported was SOB. Could not obtain history from patient due to dementia?acute confusion. Called [facility]- [phone], talked to patient's provider who reported patient has been short-winded recently, denies cough, denies difficulty swallowing she is on a regular diet.  No abdominal pain, no vomiting, no loose stools.  Dementia is mild, but has progressed over the past few weeks. At baseline patient can answer simple questions appropriately but today patient speech has not been making sense.  Provider also does not know what patient's CODE STATUS is.  ED Course: Temperature one 102, blood pressure 122/63 O2 sats 91 to 92% on room air.  WBC- 20, lactic acid normal 1.5 >> 0.69. Mildly low sodium 133.  Port chest x-ray- mild airspace disease at the peripheral right lung base suspicious for small pneumonia possibly aspiration.  UA pending.  Blood cultures obtained. 1L bolus given.  Started on IV ceftriaxone and azithromycin for Pneumonia.  Review of Systems: Unable to obtain due to patient's mental status.  Past Medical History:  Diagnosis Date  . Anxiety   . Dementia ([place])   . Hypertension   . Overactive bladder   . Pneumonia   . Stroke [facility])     Past Surgical History:  Procedure Laterality Date  . HIP ADDUCTOR TENOTOMY    . JOINT REPLACEMENT       reports that she has never smoked. She has never used smokeless tobacco. She reports that she does not drink alcohol or use drugs.  Allergies  Allergen Reactions  . Sulfa Antibiotics     History reviewed. No pertinent family history.  Prior to Admission medications   Medication  Sig Start Date End Date Taking? Authorizing Provider  acetaminophen (TYLENOL) 500 MG tablet Take 1,000 mg by mouth at bedtime.    Yes [provider]  amLODipine-benazepril (LOTREL) 10-20 MG capsule Take 1 capsule by mouth daily.  06/27/16  Yes [provider]  conjugated estrogens (PREMARIN) vaginal cream Place 1 Applicatorful vaginally at bedtime.   Yes [provider]  mirabegron ER (MYRBETRIQ) 25 MG TB24 tablet Take 25 mg by mouth daily.   Yes [provider]  sertraline (ZOLOFT) 50 MG tablet Take 50 mg by mouth daily.  06/24/16  Yes [provider]  traZODone (DESYREL) 50 MG tablet Take 50 mg by mouth at bedtime.   Yes [provider]    Physical Exam: Mostly limited by patient's mental status Vitals:   12/17/17 1614 12/17/17 1830  BP: 122/63 (!) 122/55  Pulse: 96 88  Resp: 18 19  Temp: (!) 101.2 F (38.4 C)   TempSrc: Oral   SpO2: 92% 91%    Constitutional: NAD, calm, comfortable, reportedly her hard of hearing Vitals:   12/17/17 1614 12/17/17 1830  BP: 122/63 (!) 122/55  Pulse: 96 88  Resp: 18 19  Temp: (!) 101.2 F (38.4 C)   TempSrc: Oral   SpO2: 92% 91%   Eyes: PERRL, lids and conjunctivae normal ENMT: Mucous membranes are moist. Posterior pharynx clear of any exudate or lesions.Normal dentition.  Neck: normal, supple, no masses, no thyromegaly Respiratory: no wheezing, Bilat basilar  crackles. Mild increased respiratory status. No accessory muscle use.  Kyphotic. Cardiovascular: Regular rate and rhythm, no murmurs / rubs / gallops. No extremity edema. 2+ pedal pulses.  Abdomen: no tenderness, no masses palpated. No hepatosplenomegaly. Bowel sounds positive.  Musculoskeletal: no clubbing / cyanosis. No joint deformity upper and lower extremities. Good ROM, no contractures. Normal muscle tone.  Skin: no rashes, lesions, ulcers. No induration Neurologic: CN 2-12 grossly intact. Able to hands and leg against gravity,  exam limited by patient's mental status.  Psychiatric: Inappropriate answers to questions,   Labs on Admission: I have personally reviewed following labs and imaging studies  CBC: Recent Labs  Lab 12/17/17 1636  WBC 20.1*  NEUTROABS 17.4*  HGB 13.1  HCT 40.3  MCV 93.3  PLT 803   Basic Metabolic Panel: Recent Labs  Lab 12/17/17 1648  NA 133*  K 3.7  CL 101  CO2 23  GLUCOSE 122*  BUN 20  CREATININE 0.98  CALCIUM 8.8*   GFR: CrCl cannot be calculated (Unknown ideal weight.). Liver Function Tests: Recent Labs  Lab 12/17/17 1648  AST 17  ALT 11  ALKPHOS 80  BILITOT 1.4*  PROT 8.0  ALBUMIN 4.4   Radiological Exams on Admission: Dg Chest Port 1 View  Result Date: 12/17/2017 CLINICAL DATA:  Fever EXAM: PORTABLE CHEST 1 VIEW COMPARISON:  07/10/2016 FINDINGS: Mild cardiomegaly without effusion. Mild airspace disease at the peripheral right lung base. No pneumothorax. IMPRESSION: Mild airspace disease at the peripheral right lung base suspicious for a small pneumonia possibly aspiration. Mild cardiomegaly Electronically Signed   By: Donavan Foil M.D.   On: 12/17/2017 18:11    EKG: Independently reviewed.  Old nonspecific T wave abnormalities 1 aVL.  Sinus rhythm premature atrial contraction QTC 436.  Assessment/Plan Active Problems:   Essential hypertension, benign   Pneumonia   Pneumonia-nursing home resident, will treat has healthcare associated pneumonia.  WBC- 20.  Temperature 101.2.  Chest X-ray -mild airspace disease, peripheral right lung, small pneumonia possibly aspiration. Meets sepsis criteria without lactic acidosis.  IV ceftriaxone and azithromycin given in ED. -IV Vanco and Zosyn, cont azithromycin for atypical coverage -Possible aspiration suggested on x-ray- Zosyn -Swallow evaluation -CBC BMP in a.m. -Follow-up UA, urine cultures -Follow blood cultures - N/s + 20 KCL 100cc/hr X 15 hrs.  Metabolic encephalopathy likely secondary to sepsis. -IV  antibiotics -Follow-up UA urine cultures blood cultures  HTN- Stable. - Hold Norvasc and benazepril for now in the setting of sepsis.   DVT prophylaxis: Lovenox Code Status: Assume Full for now. Family Communication: None at bedside Disposition Plan: Per rounding team Consults called: NONE Admission status: Inpt, tele   Bethena Roys MD [facility] Pager [phone] From 3PM-11PM.  Otherwise please contact night-coverage [email or web address] Password [email or web address]  12/17/2017, 8:20 PM

## 2017-12-17 NOTE — Progress Notes (Signed)
Pharmacy Antibiotic Note  [name] is a 84 y.o. female admitted on 12/17/2017 with  HCAP and possible aspiration pneumonia.  Pharmacy has been consulted for vancomycin and cefepime  dosing.  Plan:  start Zosyn 3.375g IV q8h (4h- infusion)  Loading dose:  vancomcyin 1g x1 dose Maintenance dose:  Vancomycin 500mg  IV q24h Goal vancomcyin trough: 15-20 mcg/mL Pharmacy will continue to monitor renal function, vancomycin troughs as clinically appropriate, cultures and patient progress.   Height: 5\' 2"  (157.5 cm) Weight: 106 lb 0.7 oz (48.1 kg) IBW/kg (Calculated) : 50.1  Temp (24hrs), Avg:101.2 F (38.4 C), Min:101.2 F (38.4 C), Max:101.2 F (38.4 C)  Recent Labs  Lab 12/17/17 1636 12/17/17 1648 12/17/17 1738 12/17/17 1858  WBC 20.1*  --   --   --   CREATININE  --  0.98  --   --   LATICACIDVEN  --   --  1.55 0.69    Estimated Creatinine Clearance: 26.1 mL/min (by C-G formula based on SCr of 0.98 mg/dL).    Allergies  Allergen Reactions  . Sulfa Antibiotics     Antimicrobials this admission: Zosyn 12/1 >>  Vancomycin 12/1 >>    Microbiology results: 12/1 [facility] x2: pending 12/1 UCx: ordered    Thank you for allowing pharmacy to participate in  this patient's care.  Despina Pole 12/17/2017 8:29 PM

## 2017-12-18 ENCOUNTER — Other Ambulatory Visit: Payer: Self-pay

## 2017-12-18 LAB — BASIC METABOLIC PANEL
Anion gap: 8 (ref 5–15)
BUN: 15 mg/dL (ref 8–23)
CO2: 24 mmol/L (ref 22–32)
CREATININE: 0.84 mg/dL (ref 0.44–1.00)
Calcium: 8.5 mg/dL — ABNORMAL LOW (ref 8.9–10.3)
Chloride: 106 mmol/L (ref 98–111)
GFR calc Af Amer: >60 mL/min (ref >60)
GFR calc non Af Amer: 59 mL/min — ABNORMAL LOW (ref >60)
Glucose, Bld: 120 mg/dL — ABNORMAL HIGH (ref 70–99)
Potassium: 3.6 mmol/L (ref 3.5–5.1)
Sodium: 138 mmol/L (ref 135–145)

## 2017-12-18 LAB — CBC
HCT: 38.5 % (ref 36.0–46.0)
Hemoglobin: 12.2 g/dL (ref 12.0–15.0)
MCH: 30.5 pg (ref 26.0–34.0)
MCHC: 31.7 g/dL (ref 30.0–36.0)
MCV: 96.3 fL (ref 80.0–100.0)
Platelets: 211 10*3/uL (ref 150–400)
RBC: 4 MIL/uL (ref 3.87–5.11)
RDW: 12.6 % (ref 11.5–15.5)
WBC: 21.4 10*3/uL — ABNORMAL HIGH (ref 4.0–10.5)
nRBC: 0 % (ref 0.0–0.2)

## 2017-12-18 LAB — MRSA PCR SCREENING: MRSA by PCR: NEGATIVE

## 2017-12-18 LAB — URINALYSIS, ROUTINE W REFLEX MICROSCOPIC
BILIRUBIN URINE: NEGATIVE
Glucose, UA: NEGATIVE mg/dL
Ketones, ur: NEGATIVE mg/dL
Nitrite: NEGATIVE
PH: 6 (ref 5.0–8.0)
Protein, ur: NEGATIVE mg/dL
SPECIFIC GRAVITY, URINE: 1.006 (ref 1.005–1.030)

## 2017-12-18 MED ORDER — VANCOMYCIN HCL 500 MG IV SOLR
500.0000 mg | INTRAVENOUS | Status: DC
  Filled 2017-12-18: qty 500

## 2017-12-18 MED ORDER — INFLUENZA VAC SPLIT HIGH-DOSE 0.5 ML IM SUSY
0.5000 mL | PREFILLED_SYRINGE | INTRAMUSCULAR | Status: DC
  Filled 2017-12-18: qty 0.5

## 2017-12-18 NOTE — Progress Notes (Signed)
Rehab Admissions Coordinator Note:  Per SLP recommendation, Patient was screened by Jhonnie Garner for appropriateness for an Inpatient Acute Rehab Consult. Noted pt is from SNF. No PT or OT notes in at this time. Anticipate pt would return to [facility], however, [facility] will follow for further therapy recommendations.   Jhonnie Garner 12/18/2017, 1:40 PM  I can be reached at [phone]

## 2017-12-18 NOTE — Progress Notes (Signed)
Initial Nutrition Assessment  DOCUMENTATION CODES:      INTERVENTION:  Magic cup TID with meals, each supplement provides 290 kcal and 9 grams of protein    NUTRITION DIAGNOSIS:  Inadequate oral intake related to lethargy/confusion, chronic illness(Chronic dementia and acute PNA) as evidenced by meal completion < 50%.   GOAL:  Patient will meet greater than or equal to 90% of their needs   MONITOR:  Po intake, labs and wt trends     REASON FOR ASSESSMENT:   Malnutrition Screening Tool    ASSESSMENT: Patient presents from ALF with hx of dementia, CVA, HTN. Febrile on admission and suspected aspiration pneumonia. Chest -xray obtained. ST completed bedside swallow evaluation - mechanical soft diet with thin liquids recommended. Aspiration precautions- mild risk.  Patient is able to feed herself per ST assessment. Poor dentition. Her diet texture downgraded. Patient has chewing difficulty. Talked with nursing who reports pt only consumed 120 ml coffee at breakfast today. Her lunch tray is here but she has not consumed any at time of RD visit. Patient is a poor historian. Current intake is poor expect related at least in part to acute illness.    Patient weight history reviewed-48 kg the past 2 years according to hospital records.   Medications reviewed and include: zoloft, desyrel, zithromax, zosyn.   NUTRITION - FOCUSED PHYSICAL EXAM: Partial exam completed. Mild muscle / fat loss as expected with a person of her age. Acute illness and poor appetite places her at high risk for quickly entering a malnourished state.   Diet Order:   Diet Order            DIET DYS 3 Room service appropriate? Yes; Fluid consistency: Thin  Diet effective now             EDUCATION NEEDS:  Not appropriate for education at this time Skin:  Skin Assessment: Reviewed RN Assessment  Last BM:  12/2  Height:   Ht Readings from Last 1 Encounters:  12/17/17 5\' 2"  (1.575 m)    Weight:   Wt  Readings from Last 1 Encounters:  12/17/17 48.1 kg    Ideal Body Weight:  50 kg  BMI:  Body mass index is 19.4 kg/m.  Estimated Nutritional Needs:   Kcal:  5956-3875 (25-28 kcal/kg/bw)  Protein:  57-62 gr (1.2-1.3 gr/kg/bw)  Fluid:  >1200 ml daily    Colman Cater MS,RD,CSG,LDN Office: [phone] Pager: [phone]

## 2017-12-18 NOTE — Progress Notes (Signed)
Subjective: [name] was admitted with pneumonia presumably from aspiration.  [name] is on broad-spectrum antibiotics because [name] lives at an assisted living facility.  [name] is very confused this morning  Objective: Vital signs in last 24 hours: Temp:  [99.3 F (37.4 C)-101.2 F (38.4 C)] 99.3 F (37.4 C) (12/02 0518) Pulse Rate:  [80-96] 88 (12/02 0518) Resp:  [18-20] 20 (12/02 0518) BP: (114-153)/(50-64) 153/64 (12/02 0518) SpO2:  [88 %-93 %] 88 % (12/02 0518) Weight:  [48.1 kg] 48.1 kg (12/01 2000) Weight change:     Intake/Output from previous day: 12/01 0701 - 12/02 0700 In: 2067.9 [I.V.:617.9; IV Piggyback:1450] Out: 301 [Urine:301]  PHYSICAL EXAM General appearance: Arousable and confused Resp: rhonchi bilaterally Cardio: regular rate and rhythm, S1, S2 normal, no murmur, click, rub or gallop GI: soft, non-tender; bowel sounds normal; no masses,  no organomegaly Extremities: extremities normal, atraumatic, no cyanosis or edema  Lab Results:  Results for orders placed or performed during the hospital encounter of 12/17/17 (from the past 48 hour(s))  CBC with Differential     Status: Abnormal   Collection Time: 12/17/17  4:36 PM  Result Value Ref Range   WBC 20.1 (H) 4.0 - 10.5 K/uL    Comment: WHITE COUNT CONFIRMED ON SMEAR   RBC 4.32 3.87 - 5.11 MIL/uL   Hemoglobin 13.1 12.0 - 15.0 g/dL   HCT 40.3 36.0 - 46.0 %   MCV 93.3 80.0 - 100.0 fL   MCH 30.3 26.0 - 34.0 pg   MCHC 32.5 30.0 - 36.0 g/dL   RDW 12.4 11.5 - 15.5 %   Platelets 255 150 - 400 K/uL   nRBC 0.0 0.0 - 0.2 %   Neutrophils Relative % 86 %   Neutro Abs 17.4 (H) 1.7 - 7.7 K/uL   Lymphocytes Relative 5 %   Lymphs Abs 0.9 0.7 - 4.0 K/uL   Monocytes Relative 8 %   Monocytes Absolute 1.5 (H) 0.1 - 1.0 K/uL   Eosinophils Relative 0 %   Eosinophils Absolute 0.0 0.0 - 0.5 K/uL   Basophils Relative 0 %   Basophils Absolute 0.1 0.0 - 0.1 K/uL   Immature Granulocytes 1 %   Abs Immature Granulocytes 0.14 (H) 0.00 -  0.07 K/uL    Comment: Performed at [facility], [address], [place], [place] [address]  Culture, blood (routine x 2)     Status: None (Preliminary result)   Collection Time: 12/17/17  4:43 PM  Result Value Ref Range   Specimen Description LEFT ANTECUBITAL    Special Requests      BOTTLES DRAWN AEROBIC AND ANAEROBIC Blood Culture adequate volume   Culture      NO GROWTH < 24 HOURS Performed at [facility], [address], [place], [place] [address]    Report Status PENDING   Comprehensive metabolic panel     Status: Abnormal   Collection Time: 12/17/17  4:48 PM  Result Value Ref Range   Sodium 133 (L) 135 - 145 mmol/L   Potassium 3.7 3.5 - 5.1 mmol/L   Chloride 101 98 - 111 mmol/L   CO2 23 22 - 32 mmol/L   Glucose, Bld 122 (H) 70 - 99 mg/dL   BUN 20 8 - 23 mg/dL   Creatinine, Ser 0.98 0.44 - 1.00 mg/dL   Calcium 8.8 (L) 8.9 - 10.3 mg/dL   Total Protein 8.0 6.5 - 8.1 g/dL   Albumin 4.4 3.5 - 5.0 g/dL   AST 17 15 - 41 U/L  ALT 11 0 - 44 U/L   Alkaline Phosphatase 80 38 - 126 U/L   Total Bilirubin 1.4 (H) 0.3 - 1.2 mg/dL   GFR calc non Af Amer 49 (L) >60 mL/min   GFR calc Af Amer 57 (L) >60 mL/min   Anion gap 9 5 - 15    Comment: Performed at [facility], [address], [place], [place] [address]  Culture, blood (routine x 2)     Status: None (Preliminary result)   Collection Time: 12/17/17  5:31 PM  Result Value Ref Range   Specimen Description BLOOD RIGHT HAND    Special Requests      BOTTLES DRAWN AEROBIC AND ANAEROBIC Blood Culture results may not be optimal due to an inadequate volume of blood received in culture bottles   Culture      NO GROWTH < 24 HOURS Performed at [facility], [address], [place], [place] [address]    Report Status PENDING   I-Stat CG4 Lactic Acid, ED     Status: None   Collection Time: 12/17/17  5:38 PM  Result Value Ref Range   Lactic Acid, Venous 1.55 0.5 - 1.9 mmol/L  I-Stat CG4 Lactic Acid, ED     Status: None   Collection Time:  12/17/17  6:58 PM  Result Value Ref Range   Lactic Acid, Venous 0.69 0.5 - 1.9 mmol/L  Urinalysis, Routine w reflex microscopic     Status: Abnormal   Collection Time: 12/17/17  9:45 PM  Result Value Ref Range   Color, Urine STRAW (A) YELLOW   APPearance CLEAR CLEAR   Specific Gravity, Urine 1.006 1.005 - 1.030   pH 6.0 5.0 - 8.0   Glucose, UA NEGATIVE NEGATIVE mg/dL   Hgb urine dipstick MODERATE (A) NEGATIVE   Bilirubin Urine NEGATIVE NEGATIVE   Ketones, ur NEGATIVE NEGATIVE mg/dL   Protein, ur NEGATIVE NEGATIVE mg/dL   Nitrite NEGATIVE NEGATIVE   Leukocytes, UA MODERATE (A) NEGATIVE   RBC / HPF 0-5 0 - 5 RBC/hpf   WBC, UA 21-50 0 - 5 WBC/hpf   Bacteria, UA RARE (A) NONE SEEN    Comment: Performed at [facility], [address] [place], [place] [address]  MRSA PCR Screening     Status: None   Collection Time: 12/18/17 12:46 AM  Result Value Ref Range   MRSA by PCR NEGATIVE NEGATIVE    Comment:        The GeneXpert MRSA Assay (FDA approved for NASAL specimens only), is one component of a comprehensive MRSA colonization surveillance program. It is not intended to diagnose MRSA infection nor to guide or monitor treatment for MRSA infections. Performed at [facility], [address], [place],  [address]   Basic metabolic panel     Status: Abnormal   Collection Time: 12/18/17  4:30 AM  Result Value Ref Range   Sodium 138 135 - 145 mmol/L   Potassium 3.6 3.5 - 5.1 mmol/L   Chloride 106 98 - 111 mmol/L   CO2 24 22 - 32 mmol/L   Glucose, Bld 120 (H) 70 - 99 mg/dL   BUN 15 8 - 23 mg/dL   Creatinine, Ser 0.84 0.44 - 1.00 mg/dL   Calcium 8.5 (L) 8.9 - 10.3 mg/dL   GFR calc non Af Amer 59 (L) >60 mL/min   GFR calc Af Amer >60 >60 mL/min   Anion gap 8 5 - 15    Comment: Performed at [facility], [address],  [place], [place] [address]  CBC     Status: Abnormal   Collection Time: 12/18/17  4:30 AM  Result Value Ref Range   WBC 21.4 (H) 4.0 - 10.5 K/uL   RBC 4.00  3.87 - 5.11 MIL/uL   Hemoglobin 12.2 12.0 - 15.0 g/dL   HCT 38.5 36.0 - 46.0 %   MCV 96.3 80.0 - 100.0 fL   MCH 30.5 26.0 - 34.0 pg   MCHC 31.7 30.0 - 36.0 g/dL   RDW 12.6 11.5 - 15.5 %   Platelets 211 150 - 400 K/uL   nRBC 0.0 0.0 - 0.2 %    Comment: Performed at [facility], [address] [place], [place] [address]    ABGS No results for input(s): PHART, PO2ART, TCO2, HCO3 in the last 72 hours.  Invalid input(s): PCO2 CULTURES Recent Results (from the past 240 hour(s))  Culture, blood (routine x 2)     Status: None (Preliminary result)   Collection Time: 12/17/17  4:43 PM  Result Value Ref Range Status   Specimen Description LEFT ANTECUBITAL  Final   Special Requests   Final    BOTTLES DRAWN AEROBIC AND ANAEROBIC Blood Culture adequate volume   Culture   Final    NO GROWTH < 24 HOURS Performed at [facility], [address], [place], [place] [address]    Report Status PENDING  Incomplete  Culture, blood (routine x 2)     Status: None (Preliminary result)   Collection Time: 12/17/17  5:31 PM  Result Value Ref Range Status   Specimen Description BLOOD RIGHT HAND  Final   Special Requests   Final    BOTTLES DRAWN AEROBIC AND ANAEROBIC Blood Culture results may not be optimal due to an inadequate volume of blood received in culture bottles   Culture   Final    NO GROWTH < 24 HOURS Performed at [facility], [address] [place], [place] [address]    Report Status PENDING  Incomplete  MRSA PCR Screening     Status: None   Collection Time: 12/18/17 12:46 AM  Result Value Ref Range Status   MRSA by PCR NEGATIVE NEGATIVE Final    Comment:        The GeneXpert MRSA Assay (FDA approved for NASAL specimens only), is one component of a comprehensive MRSA colonization surveillance program. It is not intended to diagnose MRSA infection nor to guide or monitor treatment for MRSA infections. Performed at [facility], [address] [place], [place] [address]     Studies/Results: Dg Chest Port 1 View  Result Date: 12/17/2017 CLINICAL DATA:  Fever EXAM: PORTABLE CHEST 1 VIEW COMPARISON:  07/10/2016 FINDINGS: Mild cardiomegaly without effusion. Mild airspace disease at the peripheral right lung base. No pneumothorax. IMPRESSION: Mild airspace disease at the peripheral right lung base suspicious for a small pneumonia possibly aspiration. Mild cardiomegaly Electronically Signed   By: Donavan Foil M.D.   On: 12/17/2017 18:11    Medications:  Prior to Admission:  Medications Prior to Admission  Medication Sig Dispense Refill Last Dose  . acetaminophen (TYLENOL) 500 MG tablet Take 1,000 mg by mouth at bedtime.    12/16/2017 at Unknown time  . amLODipine-benazepril (LOTREL) 10-20 MG capsule Take 1 capsule by mouth daily.    12/17/2017 at Unknown time  . conjugated estrogens (PREMARIN) vaginal cream Place 1 Applicatorful vaginally at bedtime.   12/16/2017 at Unknown time  . mirabegron ER (MYRBETRIQ) 25 MG TB24 tablet Take 25 mg by mouth  daily.   12/17/2017 at Unknown time  . sertraline (ZOLOFT) 50 MG tablet Take 50 mg by mouth daily.    12/17/2017 at Unknown time  . traZODone (DESYREL) 50 MG tablet Take 50 mg by mouth at bedtime.   12/16/2017 at Unknown time   Scheduled: . enoxaparin (LOVENOX) injection  30 mg Subcutaneous Q24H  . [START ON 12/19/2017] Influenza vac split quadrivalent PF  0.5 mL Intramuscular Tomorrow-1000  . mirabegron ER  25 mg Oral Daily  . sertraline  50 mg Oral Daily  . traZODone  50 mg Oral QHS   Continuous: . 0.9 % NaCl with KCl 20 mEq / L 100 mL/hr at 12/18/17 0500  . azithromycin Stopped (12/17/17 1856)  . piperacillin-tazobactam (ZOSYN)  IV 3.375 g (12/18/17 0518)  . vancomycin     URK:[id number] **OR** acetaminophen, ondansetron **OR** ondansetron (ZOFRAN) IV, polyethylene glycol  Assesment: [name] was admitted with pneumonia presumably from aspiration.  I think [name] is about the same  [name] has hypertension which is pretty  well controlled on current medications  [name] has had trouble with anxiety and depression [name] is on medication for that which has worked  [name] has dementia which has worsened in the last several months.  I have had multiple discussions with her regarding CODE STATUS and [name] does not want resuscitation Active Problems:   Essential hypertension, benign   Pneumonia    Plan: Continue treatments.  Continue medications.  Change to DNR    LOS: 1 day   Rondell Frick L 12/18/2017, 8:46 AM

## 2017-12-18 NOTE — Evaluation (Signed)
Clinical/Bedside Swallow Evaluation Patient Details  Name: [name] MRN: [id number] Date of Birth: 1920/05/21  Today's Date: 12/18/2017 Time: SLP Start Time (ACUTE ONLY): 6 SLP Stop Time (ACUTE ONLY): 1129 SLP Time Calculation (min) (ACUTE ONLY): 26 min  Past Medical History:  Past Medical History:  Diagnosis Date  . Anxiety   . Dementia ([place])   . Hypertension   . Overactive bladder   . Pneumonia   . Stroke [facility])    Past Surgical History:  Past Surgical History:  Procedure Laterality Date  . HIP ADDUCTOR TENOTOMY    . JOINT REPLACEMENT     HPI:  [name] is a 84 y.o. female admitted on 12/17/2017 with  HCAP and possible aspiration pneumonia. Pt with increased confusion. CXR reveals: Mild airspace disease at the peripheral right lung base suspicious for a small pneumonia possibly aspiration.    Assessment / Plan / Recommendation Clinical Impression  Pt was easily roused and repositioned herself to sitting upright for PO trials; Pt continues to be pleasantly confused however she did report that she has PNA and is in the hospital. Pt consumed regular textures, puree and thin liquids with occasional delayed throat clearing and occasionally noted multiple swallows which could be s/sx of oropharyngeal dysphagia or could be normal for age related changes in swallowing function; we are unable to definitively determine at bedside. Pt completed and passed the 3 oz water test with no overt s/sx of aspiration (this test has high sensitivity and specificity for accurately identifying aspirators and indicating need for further instrumental testing). Pt is edentulous however demonstrated no difficulty with mastication of solids; I do recommend downgrade to D3/mech soft, question Pt's cognitive ability to thoroughly cut/prepare food to appropriate size for intake. ST will f/u X1 to ensure diet tolerance and to ensure instrumental testing is not indicated prior to signing off. SLP Visit  Diagnosis: Dysphagia, unspecified (R13.10)    Aspiration Risk  Mild aspiration risk    Diet Recommendation Dysphagia 3 (Mech soft);Thin liquid   Liquid Administration via: Cup;Straw Medication Administration: Whole meds with liquid Supervision: Patient able to self feed Compensations: Minimize environmental distractions;Slow rate;Small sips/bites;Multiple dry swallows after each bite/sip;Follow solids with liquid;Clear throat intermittently Postural Changes: Seated upright at 90 degrees;Remain upright for at least 30 minutes after po intake    Other  Recommendations Oral Care Recommendations: Oral care BID   Follow up Recommendations Skilled Nursing facility;24 hour supervision/assistance      Frequency and Duration min 1 x/week  1 week       Prognosis Prognosis for Safe Diet Advancement: Good Barriers to Reach Goals: Cognitive deficits      Swallow Study   General Date of Onset: 12/17/17 HPI: [name] is a 84 y.o. female admitted on 12/17/2017 with  HCAP and possible aspiration pneumonia. Pt with increased confusion. CXR reveals: Mild airspace disease at the peripheral right lung base suspicious for a small pneumonia possibly aspiration.  Type of Study: Bedside Swallow Evaluation Previous Swallow Assessment: none in chart Diet Prior to this Study: Regular;Thin liquids Temperature Spikes Noted: Yes Respiratory Status: Room air History of Recent Intubation: No Behavior/Cognition: Alert;Cooperative;Pleasant mood;Confused Oral Cavity Assessment: Within Functional Limits Oral Care Completed by SLP: Recent completion by staff Oral Cavity - Dentition: Edentulous Vision: Functional for self-feeding Self-Feeding Abilities: Able to feed self Patient Positioning: Upright in bed Baseline Vocal Quality: Normal Volitional Cough: Strong Volitional Swallow: Able to elicit    Oral/Motor/Sensory Function Overall Oral Motor/Sensory Function: Within functional  limits   Ice Chips  Ice chips: Within functional limits   Thin Liquid Thin Liquid: Impaired Pharyngeal  Phase Impairments: Multiple swallows;Throat Clearing - Delayed    Nectar Thick Nectar Thick Liquid: Not tested   Honey Thick Honey Thick Liquid: Not tested   Puree Puree: Within functional limits   Solid     Solid: Impaired Pharyngeal Phase Impairments: Throat Clearing - Delayed;Multiple swallows     [name] H. [name], [name] Speech Language Pathologist  [name] 12/18/2017,11:34 AM

## 2017-12-19 LAB — URINE CULTURE: Culture: NO GROWTH

## 2017-12-19 NOTE — Clinical Social Work Note (Signed)
Clinical Social Work Assessment  Patient Details  Name: [name] MRN: [id number] Date of Birth: 1920/07/25  Date of referral:  12/19/17               Reason for consult:  Discharge Planning                Permission sought to share information with:    Permission granted to share information::     Name::        Agency::     Relationship::     Contact Information:     Housing/Transportation Living arrangements for the past 2 months:  [place] of Information:  Facility Patient Interpreter Needed:  None Criminal Activity/Legal Involvement Pertinent to Current Situation/Hospitalization:  No - Comment as needed Significant Relationships:  Adult Children Lives with:  Facility Resident Do you feel safe going back to the place where you live?  Yes Need for family participation in patient care:  No (Coment)  Care giving concerns: Pt admitted from [facility].   Social Worker assessment / plan: Pt is a 84 year old female admitted from [place]. Reviewed pt's record today and spoke with Tammy from [facility] today to update. Pt has lived at [facility] since July 2018. She has dementia. Pt uses a walker with supervision for ambulation. Pt reportedly has very good family support. Her daughter lives in [place] but comes to see pt regularly. Chart notes show that speech therapist suggested inpatient rehab. Awaiting PT/OT evaluations. Will await further direction from MD and therapy and will assist with referrals if needed.  Employment status:  Retired Nurse, adult PT Recommendations:  Not assessed at this time Information / Referral to community resources:     Patient/Family's Response to care: Pt accepting of care.  Patient/Family's Understanding of and Emotional Response to Diagnosis, Current Treatment, and Prognosis: Pt has dementia and is not able to understand diagnosis and treatment recommendations. Her daughter is able  to understand this information. No emotional distress identified.  Emotional Assessment Appearance:  Appears stated age Attitude/Demeanor/Rapport:  Engaged Affect (typically observed):  Pleasant Orientation:  Oriented to Self Alcohol / Substance use:  Not Applicable Psych involvement (Current and /or in the community):  No (Comment)  Discharge Needs  Concerns to be addressed:  Discharge Planning Concerns Readmission within the last 30 days:  No Current discharge risk:  None Barriers to Discharge:  No Barriers Identified   Shade Flood, LCSW 12/19/2017, 1:01 PM

## 2017-12-19 NOTE — Progress Notes (Signed)
Subjective: She says she does not feel well.  Speech evaluation noted and appreciated.  This morning she says she wants to stop treatment.  I think she is still somewhat confused.  Objective: Vital signs in last 24 hours: Temp:  [98.5 F (36.9 C)-100.2 F (37.9 C)] 98.6 F (37 C) (12/03 0548) Pulse Rate:  [78-86] 86 (12/03 0548) Resp:  [18-20] 18 (12/03 0548) BP: (120-149)/(61-74) 149/61 (12/03 0548) SpO2:  [91 %-94 %] 91 % (12/03 0548) Weight change:  Last BM Date: 12/18/17  Intake/Output from previous day: 12/02 0701 - 12/03 0700 In: 757.6 [P.O.:600; IV Piggyback:157.6] Out: 700 [Urine:700]  PHYSICAL EXAM General appearance: alert, cooperative, no distress and Mildly confused Resp: rhonchi bilaterally Cardio: regular rate and rhythm, S1, S2 normal, no murmur, click, rub or gallop GI: soft, non-tender; bowel sounds normal; no masses,  no organomegaly Extremities: extremities normal, atraumatic, no cyanosis or edema  Lab Results:  Results for orders placed or performed during the hospital encounter of 12/17/17 (from the past 48 hour(s))  CBC with Differential     Status: Abnormal   Collection Time: 12/17/17  4:36 PM  Result Value Ref Range   WBC 20.1 (H) 4.0 - 10.5 K/uL    Comment: WHITE COUNT CONFIRMED ON SMEAR   RBC 4.32 3.87 - 5.11 MIL/uL   Hemoglobin 13.1 12.0 - 15.0 g/dL   HCT 40.3 36.0 - 46.0 %   MCV 93.3 80.0 - 100.0 fL   MCH 30.3 26.0 - 34.0 pg   MCHC 32.5 30.0 - 36.0 g/dL   RDW 12.4 11.5 - 15.5 %   Platelets 255 150 - 400 K/uL   nRBC 0.0 0.0 - 0.2 %   Neutrophils Relative % 86 %   Neutro Abs 17.4 (H) 1.7 - 7.7 K/uL   Lymphocytes Relative 5 %   Lymphs Abs 0.9 0.7 - 4.0 K/uL   Monocytes Relative 8 %   Monocytes Absolute 1.5 (H) 0.1 - 1.0 K/uL   Eosinophils Relative 0 %   Eosinophils Absolute 0.0 0.0 - 0.5 K/uL   Basophils Relative 0 %   Basophils Absolute 0.1 0.0 - 0.1 K/uL   Immature Granulocytes 1 %   Abs Immature Granulocytes 0.14 (H) 0.00 - 0.07 K/uL     Comment: Performed at [facility], [address], [place], [place] [address]  Culture, blood (routine x 2)     Status: None (Preliminary result)   Collection Time: 12/17/17  4:43 PM  Result Value Ref Range   Specimen Description LEFT ANTECUBITAL    Special Requests      BOTTLES DRAWN AEROBIC AND ANAEROBIC Blood Culture adequate volume   Culture      NO GROWTH 2 DAYS Performed at [facility], [address] [place], [place] [address]    Report Status PENDING   Comprehensive metabolic panel     Status: Abnormal   Collection Time: 12/17/17  4:48 PM  Result Value Ref Range   Sodium 133 (L) 135 - 145 mmol/L   Potassium 3.7 3.5 - 5.1 mmol/L   Chloride 101 98 - 111 mmol/L   CO2 23 22 - 32 mmol/L   Glucose, Bld 122 (H) 70 - 99 mg/dL   BUN 20 8 - 23 mg/dL   Creatinine, Ser 0.98 0.44 - 1.00 mg/dL   Calcium 8.8 (L) 8.9 - 10.3 mg/dL   Total Protein 8.0 6.5 - 8.1 g/dL   Albumin 4.4 3.5 - 5.0 g/dL   AST 17 15 - 41 U/L  ALT 11 0 - 44 U/L   Alkaline Phosphatase 80 38 - 126 U/L   Total Bilirubin 1.4 (H) 0.3 - 1.2 mg/dL   GFR calc non Af Amer 49 (L) >60 mL/min   GFR calc Af Amer 57 (L) >60 mL/min   Anion gap 9 5 - 15    Comment: Performed at [facility], [address] [place], [place] [address]  Culture, blood (routine x 2)     Status: None (Preliminary result)   Collection Time: 12/17/17  5:31 PM  Result Value Ref Range   Specimen Description BLOOD RIGHT HAND    Special Requests      BOTTLES DRAWN AEROBIC AND ANAEROBIC Blood Culture results may not be optimal due to an inadequate volume of blood received in culture bottles   Culture      NO GROWTH 2 DAYS Performed at [facility], [address] [place], [place] [address]    Report Status PENDING   I-Stat CG4 Lactic Acid, ED     Status: None   Collection Time: 12/17/17  5:38 PM  Result Value Ref Range   Lactic Acid, Venous 1.55 0.5 - 1.9 mmol/L  I-Stat CG4 Lactic Acid, ED     Status: None   Collection Time: 12/17/17  6:58 PM   Result Value Ref Range   Lactic Acid, Venous 0.69 0.5 - 1.9 mmol/L  Urinalysis, Routine w reflex microscopic     Status: Abnormal   Collection Time: 12/17/17  9:45 PM  Result Value Ref Range   Color, Urine STRAW (A) YELLOW   APPearance CLEAR CLEAR   Specific Gravity, Urine 1.006 1.005 - 1.030   pH 6.0 5.0 - 8.0   Glucose, UA NEGATIVE NEGATIVE mg/dL   Hgb urine dipstick MODERATE (A) NEGATIVE   Bilirubin Urine NEGATIVE NEGATIVE   Ketones, ur NEGATIVE NEGATIVE mg/dL   Protein, ur NEGATIVE NEGATIVE mg/dL   Nitrite NEGATIVE NEGATIVE   Leukocytes, UA MODERATE (A) NEGATIVE   RBC / HPF 0-5 0 - 5 RBC/hpf   WBC, UA 21-50 0 - 5 WBC/hpf   Bacteria, UA RARE (A) NONE SEEN    Comment: Performed at [facility], [address] [place], [place] [address]  Urine culture     Status: None   Collection Time: 12/17/17  9:45 PM  Result Value Ref Range   Specimen Description      URINE, CLEAN CATCH Performed at [facility], [address] [place], [place] [address]    Special Requests      NONE Performed at [facility], [address] [place], [place] [address]    Culture      NO GROWTH Performed at [facility], [place] [address] [place], [place] [address]    Report Status 12/19/2017 FINAL   MRSA PCR Screening     Status: None   Collection Time: 12/18/17 12:46 AM  Result Value Ref Range   MRSA by PCR NEGATIVE NEGATIVE    Comment:        The GeneXpert MRSA Assay (FDA approved for NASAL specimens only), is one component of a comprehensive MRSA colonization surveillance program. It is not intended to diagnose MRSA infection nor to guide or monitor treatment for MRSA infections. Performed at [facility], [address] [place],  [address]   Basic metabolic panel     Status: Abnormal   Collection Time: 12/18/17  4:30 AM  Result Value Ref Range   Sodium 138 135 - 145 mmol/L  Potassium 3.6 3.5 - 5.1 mmol/L   Chloride 106 98 - 111 mmol/L   CO2 24 22 - 32 mmol/L   Glucose,  Bld 120 (H) 70 - 99 mg/dL   BUN 15 8 - 23 mg/dL   Creatinine, Ser 0.84 0.44 - 1.00 mg/dL   Calcium 8.5 (L) 8.9 - 10.3 mg/dL   GFR calc non Af Amer 59 (L) >60 mL/min   GFR calc Af Amer >60 >60 mL/min   Anion gap 8 5 - 15    Comment: Performed at [facility], [address] [place], [place] [address]  CBC     Status: Abnormal   Collection Time: 12/18/17  4:30 AM  Result Value Ref Range   WBC 21.4 (H) 4.0 - 10.5 K/uL   RBC 4.00 3.87 - 5.11 MIL/uL   Hemoglobin 12.2 12.0 - 15.0 g/dL   HCT 38.5 36.0 - 46.0 %   MCV 96.3 80.0 - 100.0 fL   MCH 30.5 26.0 - 34.0 pg   MCHC 31.7 30.0 - 36.0 g/dL   RDW 12.6 11.5 - 15.5 %   Platelets 211 150 - 400 K/uL   nRBC 0.0 0.0 - 0.2 %    Comment: Performed at [facility], [address], [place], [place] [address]    ABGS No results for input(s): PHART, PO2ART, TCO2, HCO3 in the last 72 hours.  Invalid input(s): PCO2 CULTURES Recent Results (from the past 240 hour(s))  Culture, blood (routine x 2)     Status: None (Preliminary result)   Collection Time: 12/17/17  4:43 PM  Result Value Ref Range Status   Specimen Description LEFT ANTECUBITAL  Final   Special Requests   Final    BOTTLES DRAWN AEROBIC AND ANAEROBIC Blood Culture adequate volume   Culture   Final    NO GROWTH 2 DAYS Performed at [facility], [address], [place], [place] [address]    Report Status PENDING  Incomplete  Culture, blood (routine x 2)     Status: None (Preliminary result)   Collection Time: 12/17/17  5:31 PM  Result Value Ref Range Status   Specimen Description BLOOD RIGHT HAND  Final   Special Requests   Final    BOTTLES DRAWN AEROBIC AND ANAEROBIC Blood Culture results may not be optimal due to an inadequate volume of blood received in culture bottles   Culture   Final    NO GROWTH 2 DAYS Performed at [facility], [address], [place], [place] [address]    Report Status PENDING  Incomplete  Urine culture     Status: None   Collection Time: 12/17/17  9:45  PM  Result Value Ref Range Status   Specimen Description   Final    URINE, CLEAN CATCH Performed at [facility], [address], [place], [place] [address]    Special Requests   Final    NONE Performed at [facility], [address] [place], [place] [address]    Culture   Final    NO GROWTH Performed at [facility], [place] [address] [place], [place] [address]    Report Status 12/19/2017 FINAL  Final  MRSA PCR Screening     Status: None   Collection Time: 12/18/17 12:46 AM  Result Value Ref Range Status   MRSA by PCR NEGATIVE NEGATIVE Final    Comment:        The GeneXpert MRSA Assay (FDA approved for NASAL specimens only), is one component of a comprehensive  MRSA colonization surveillance program. It is not intended to diagnose MRSA infection nor to guide or monitor treatment for MRSA infections. Performed at [facility], [address], [place], [place] [address]    Studies/Results: Dg Chest Port 1 View  Result Date: 12/17/2017 CLINICAL DATA:  Fever EXAM: PORTABLE CHEST 1 VIEW COMPARISON:  07/10/2016 FINDINGS: Mild cardiomegaly without effusion. Mild airspace disease at the peripheral right lung base. No pneumothorax. IMPRESSION: Mild airspace disease at the peripheral right lung base suspicious for a small pneumonia possibly aspiration. Mild cardiomegaly Electronically Signed   By: Donavan Foil M.D.   On: 12/17/2017 18:11    Medications:  Prior to Admission:  Medications Prior to Admission  Medication Sig Dispense Refill Last Dose  . acetaminophen (TYLENOL) 500 MG tablet Take 1,000 mg by mouth at bedtime.    12/16/2017 at Unknown time  . amLODipine-benazepril (LOTREL) 10-20 MG capsule Take 1 capsule by mouth daily.    12/17/2017 at Unknown time  . conjugated estrogens (PREMARIN) vaginal cream Place 1 Applicatorful vaginally at bedtime.   12/16/2017 at Unknown time  . mirabegron ER (MYRBETRIQ) 25 MG TB24 tablet Take 25 mg by mouth daily.   12/17/2017 at Unknown  time  . sertraline (ZOLOFT) 50 MG tablet Take 50 mg by mouth daily.    12/17/2017 at Unknown time  . traZODone (DESYREL) 50 MG tablet Take 50 mg by mouth at bedtime.   12/16/2017 at Unknown time   Scheduled: . enoxaparin (LOVENOX) injection  30 mg Subcutaneous Q24H  . Influenza vac split quadrivalent PF  0.5 mL Intramuscular Tomorrow-1000  . mirabegron ER  25 mg Oral Daily  . sertraline  50 mg Oral Daily  . traZODone  50 mg Oral QHS   Continuous: . azithromycin 500 mg (12/18/17 1627)  . piperacillin-tazobactam (ZOSYN)  IV 3.375 g (12/19/17 0546)   MBW:[id number] **OR** acetaminophen, ondansetron **OR** ondansetron (ZOFRAN) IV, polyethylene glycol  Assesment: She was admitted with pneumonia possibly from aspiration.  Speech evaluation shows that she is mild aspiration risk.  She is substantially better.  She has dementia at baseline.  She is more alert but still mildly confused Active Problems:   Essential hypertension, benign   Pneumonia    Plan: She has some potential for discharge in the next 24 to 48 hours.    LOS: 2 days   [name] L 12/19/2017, 9:12 AM

## 2017-12-20 MED ORDER — AMOXICILLIN-POT CLAVULANATE 875-125 MG PO TABS
1.0000 | ORAL_TABLET | Freq: Two times a day (BID) | ORAL | Status: DC
  Administered 2017-12-20 – 2017-12-21 (×3): 1 via ORAL
  Filled 2017-12-20 (×3): qty 1

## 2017-12-20 NOTE — Progress Notes (Signed)
  Speech Language Pathology Treatment: Dysphagia  Patient Details Name: [name] MRN: [id number] DOB: 1920/07/20 Today's Date: 12/20/2017 Time: 8721-5872 SLP Time Calculation (min) (ACUTE ONLY): 20 min  Assessment / Plan / Recommendation Clinical Impression  Pt was provided dysphagia therapy with trials of mechanical soft and thin liquids and education of compensatory strategies. Pt consumed mech soft trials and thin liquids via straw with no overt s/sx of oropharyngeal dsyphagia. SLP reviewed universal aspiration precautions with the Pt. There are no further ST needs noted at this time, ST to sign off.     HPI HPI: [name] is a 84 y.o. female admitted on 12/17/2017 with  HCAP and possible aspiration pneumonia. Pt with increased confusion. CXR reveals: Mild airspace disease at the peripheral right lung base suspicious for a small pneumonia possibly aspiration.       SLP Plan  All goals met       Recommendations  Diet recommendations: Dysphagia 3 (mechanical soft);Thin liquid Liquids provided via: Cup;Straw Medication Administration: Whole meds with liquid Supervision: Patient able to self feed;Intermittent supervision to cue for compensatory strategies Compensations: Minimize environmental distractions;Slow rate;Small sips/bites;Multiple dry swallows after each bite/sip;Follow solids with liquid;Clear throat intermittently Postural Changes and/or Swallow Maneuvers: Seated upright 90 degrees;Upright 30-60 min after meal                Oral Care Recommendations: Oral care BID Follow up Recommendations: Skilled Nursing facility;24 hour supervision/assistance SLP Visit Diagnosis: Dysphagia, unspecified (R13.10) Plan: All goals met       GO               [name], CCC-SLP Speech Language Pathologist  Wende Bushy 12/20/2017, 1:49 PM

## 2017-12-20 NOTE — Progress Notes (Signed)
Subjective: She says she feels okay.  No new complaints.  She wants to know when she can go back to [facility]  Objective: Vital signs in last 24 hours: Temp:  [98 F (36.7 C)-99.3 F (37.4 C)] 98 F (36.7 C) (12/04 0629) Pulse Rate:  [77-140] 77 (12/04 0629) Resp:  [16-17] 16 (12/03 2208) BP: (138-151)/(52-81) 151/52 (12/04 0629) SpO2:  [92 %-94 %] 94 % (12/04 0629) Weight change:  Last BM Date: 12/18/17  Intake/Output from previous day: 12/03 0701 - 12/04 0700 In: 623.3 [P.O.:480; IV Piggyback:143.3] Out: 700 [Urine:700]  PHYSICAL EXAM General appearance: alert, cooperative and no distress Resp: clear to auscultation bilaterally Cardio: regular rate and rhythm, S1, S2 normal, no murmur, click, rub or gallop GI: soft, non-tender; bowel sounds normal; no masses,  no organomegaly Extremities: extremities normal, atraumatic, no cyanosis or edema  Lab Results:  No results found for this or any previous visit (from the past 48 hour(s)).  ABGS No results for input(s): PHART, PO2ART, TCO2, HCO3 in the last 72 hours.  Invalid input(s): PCO2 CULTURES Recent Results (from the past 240 hour(s))  Culture, blood (routine x 2)     Status: None (Preliminary result)   Collection Time: 12/17/17  4:43 PM  Result Value Ref Range Status   Specimen Description LEFT ANTECUBITAL  Final   Special Requests   Final    BOTTLES DRAWN AEROBIC AND ANAEROBIC Blood Culture adequate volume   Culture   Final    NO GROWTH 3 DAYS Performed at [facility], [address], [place], [place] [address]    Report Status PENDING  Incomplete  Culture, blood (routine x 2)     Status: None (Preliminary result)   Collection Time: 12/17/17  5:31 PM  Result Value Ref Range Status   Specimen Description BLOOD RIGHT HAND  Final   Special Requests   Final    BOTTLES DRAWN AEROBIC AND ANAEROBIC Blood Culture results may not be optimal due to an inadequate volume of blood received in culture bottles   Culture    Final    NO GROWTH 3 DAYS Performed at [facility], [address] [place], [place] [address]    Report Status PENDING  Incomplete  Urine culture     Status: None   Collection Time: 12/17/17  9:45 PM  Result Value Ref Range Status   Specimen Description   Final    URINE, CLEAN CATCH Performed at [facility], [address], [place], [place] [address]    Special Requests   Final    NONE Performed at [facility], [address], [place], [place] [address]    Culture   Final    NO GROWTH Performed at [facility], [place] [address], [place], [place] [address]    Report Status 12/19/2017 FINAL  Final  MRSA PCR Screening     Status: None   Collection Time: 12/18/17 12:46 AM  Result Value Ref Range Status   MRSA by PCR NEGATIVE NEGATIVE Final    Comment:        The GeneXpert MRSA Assay (FDA approved for NASAL specimens only), is one component of a comprehensive MRSA colonization surveillance program. It is not intended to diagnose MRSA infection nor to guide or monitor treatment for MRSA infections. Performed at [facility], [address] [place], [place] [address]    Studies/Results: No results found.  Medications:  Prior to Admission:  Medications Prior to Admission  Medication Sig Dispense Refill Last Dose  .  acetaminophen (TYLENOL) 500 MG tablet Take 1,000 mg by mouth at bedtime.    12/16/2017 at Unknown time  . amLODipine-benazepril (LOTREL) 10-20 MG capsule Take 1 capsule by mouth daily.    12/17/2017 at Unknown time  . conjugated estrogens (PREMARIN) vaginal cream Place 1 Applicatorful vaginally at bedtime.   12/16/2017 at Unknown time  . mirabegron ER (MYRBETRIQ) 25 MG TB24 tablet Take 25 mg by mouth daily.   12/17/2017 at Unknown time  . sertraline (ZOLOFT) 50 MG tablet Take 50 mg by mouth daily.    12/17/2017 at Unknown time  . traZODone (DESYREL) 50 MG tablet Take 50 mg by mouth at bedtime.   12/16/2017 at Unknown time   Scheduled: .  amoxicillin-clavulanate  1 tablet Oral Q12H  . enoxaparin (LOVENOX) injection  30 mg Subcutaneous Q24H  . Influenza vac split quadrivalent PF  0.5 mL Intramuscular Tomorrow-1000  . mirabegron ER  25 mg Oral Daily  . sertraline  50 mg Oral Daily  . traZODone  50 mg Oral QHS   Continuous:  [id number] **OR** acetaminophen, ondansetron **OR** ondansetron (ZOFRAN) IV, polyethylene glycol  Assesment: She was admitted with pneumonia.  She is substantially improved.  She has dementia at baseline but is back to her baseline mental status and only minimally confused.  She has hypertension.  This is doing okay. Active Problems:   Essential hypertension, benign   Pneumonia    Plan: Switch to oral antibiotics probable transfer back to assisted living facility the next 24 hours    LOS: 3 days   [name] L 12/20/2017, 9:33 AM

## 2017-12-20 NOTE — Clinical Social Work Note (Signed)
LCSW following. MD notes indicate pt will likely be stable for dc back to [facility]. Updated Tammy at [facility]. Will follow up in AM to further assist with transition of care needs.

## 2017-12-20 NOTE — Care Management Important Message (Signed)
Important Message  Patient Details  Name: [name] MRN: [id number] Date of Birth: 1920/11/02   Medicare Important Message Given:  Yes    Adelie, [place] 12/20/2017, 1:49 PM

## 2017-12-21 MED ORDER — AMOXICILLIN-POT CLAVULANATE 875-125 MG PO TABS: 1.0000 | ORAL_TABLET | Freq: Two times a day (BID) | ORAL | 0 refills | Status: AC

## 2017-12-21 NOTE — Progress Notes (Signed)
Removed IV-clean, dry, intact. Patient was picked up by worker from [employer]. I wheeled stable patient to main entrance. Gave d/c paperwork to worker.

## 2017-12-21 NOTE — Clinical Social Work Note (Signed)
LCSW following. MD has discharged patient to return to [facility] today. Updated staff at [facility]. They can pick up patient at 11:30. Updated pt's RN. Attempted to call daughter but could not reach. Daughter then returned call to pt's RN who updated her about dc. Per RN, dtr in agreement with dc plan. There are no other CSW needs for dc.

## 2017-12-21 NOTE — Discharge Summary (Signed)
Physician Discharge Summary  Patient ID: [name] MRN: [id number] DOB/AGE: [birth year] 84 y.o. Primary Care Physician:[place] Hanlon, Percell Miller, MD Admit date: 12/17/2017 Discharge date: 12/21/2017    Discharge Diagnoses:   Active Problems:   Essential hypertension, benign   Pneumonia Dementia Overactive bladder Depression  Allergies as of 12/21/2017      Reactions   Sulfa Antibiotics       Medication List    TAKE these medications   acetaminophen 500 MG tablet Commonly known as:  TYLENOL Take 1,000 mg by mouth at bedtime.   amLODipine-benazepril 10-20 MG capsule Commonly known as:  LOTREL Take 1 capsule by mouth daily.   amoxicillin-clavulanate 875-125 MG tablet Commonly known as:  AUGMENTIN Take 1 tablet by mouth 2 (two) times daily for 4 days.   conjugated estrogens vaginal cream Commonly known as:  PREMARIN Place 1 Applicatorful vaginally at bedtime.   MYRBETRIQ 25 MG Tb24 tablet Generic drug:  mirabegron ER Take 25 mg by mouth daily.   sertraline 50 MG tablet Commonly known as:  ZOLOFT Take 50 mg by mouth daily.   traZODone 50 MG tablet Commonly known as:  DESYREL Take 50 mg by mouth at bedtime.       Discharged Condition: Improved    Consults: None  Significant Diagnostic Studies: Dg Chest Port 1 View  Result Date: 12/17/2017 CLINICAL DATA:  Fever EXAM: PORTABLE CHEST 1 VIEW COMPARISON:  07/10/2016 FINDINGS: Mild cardiomegaly without effusion. Mild airspace disease at the peripheral right lung base. No pneumothorax. IMPRESSION: Mild airspace disease at the peripheral right lung base suspicious for a small pneumonia possibly aspiration. Mild cardiomegaly Electronically Signed   By: Donavan Foil M.D.   On: 12/17/2017 18:11    Lab Results: Basic Metabolic Panel: No results for input(s): NA, K, CL, CO2, GLUCOSE, BUN, CREATININE, CALCIUM, MG, PHOS in the last 72 hours. Liver Function Tests: No results for input(s): AST, ALT, ALKPHOS, BILITOT, PROT,  ALBUMIN in the last 72 hours.   CBC: No results for input(s): WBC, NEUTROABS, HGB, HCT, MCV, PLT in the last 72 hours.  Recent Results (from the past 240 hour(s))  Culture, blood (routine x 2)     Status: None (Preliminary result)   Collection Time: 12/17/17  4:43 PM  Result Value Ref Range Status   Specimen Description LEFT ANTECUBITAL  Final   Special Requests   Final    BOTTLES DRAWN AEROBIC AND ANAEROBIC Blood Culture adequate volume   Culture   Final    NO GROWTH 4 DAYS Performed at [facility], [address] [place], [place] [address]    Report Status PENDING  Incomplete  Culture, blood (routine x 2)     Status: None (Preliminary result)   Collection Time: 12/17/17  5:31 PM  Result Value Ref Range Status   Specimen Description BLOOD RIGHT HAND  Final   Special Requests   Final    BOTTLES DRAWN AEROBIC AND ANAEROBIC Blood Culture results may not be optimal due to an inadequate volume of blood received in culture bottles   Culture   Final    NO GROWTH 4 DAYS Performed at [facility], [address] [place], [place] [address]    Report Status PENDING  Incomplete  Urine culture     Status: None   Collection Time: 12/17/17  9:45 PM  Result Value Ref Range Status   Specimen Description   Final    URINE, CLEAN CATCH Performed at [facility], [address], [place], [place] [address]  Special Requests   Final    NONE Performed at [facility], [address], [place], [place] [address]    Culture   Final    NO GROWTH Performed at [facility], [place] [address] [place], [place] [address]    Report Status 12/19/2017 FINAL  Final  MRSA PCR Screening     Status: None   Collection Time: 12/18/17 12:46 AM  Result Value Ref Range Status   MRSA by PCR NEGATIVE NEGATIVE Final    Comment:        The GeneXpert MRSA Assay (FDA approved for NASAL specimens only), is one component of a comprehensive MRSA colonization surveillance program. It is not intended to  diagnose MRSA infection nor to guide or monitor treatment for MRSA infections. Performed at [facility], [address] [place], [place] [address]      Hospital Course: This is a 84 year old who came from an assisted living facility with cough and congestion.  She was found to have pneumonia which was felt to be related to aspiration.  She was started on intravenous antibiotics and improved and then was switched to oral.  She tolerated that well.  She had speech evaluation and was felt to have some risk of aspiration so she was placed on a mechanical soft diet.  At the time of discharge she was back at baseline  Discharge Exam: Blood pressure (!) 162/83, pulse 86, temperature (!) 97.4 F (36.3 C), temperature source Oral, resp. rate 20, height 5\' 2"  (1.575 m), weight 48.1 kg, SpO2 95 %. She is awake and alert.  She is hard of hearing.  She has visual defects.  Chest is clear.  Disposition: Back to assisted living facility      Signed: Marabella Popiel L   12/21/2017, 8:58 AM

## 2017-12-21 NOTE — NC FL2 (Signed)
[place] MEDICAID FL2 LEVEL OF CARE SCREENING TOOL     IDENTIFICATION  Patient Name: [name] Birthdate: Jul 28, 1920 Sex: female Admission Date (Current Location): 12/17/2017  [facility] and [place] Number:  [employer] and Address:  [place] [address] [place]      Provider Number: [id number]  Attending Physician Name and Address:  Sinda Du, MD  Relative Name and Phone Number:  Tally Joe (dtr) [phone]    Current Level of Care: Hospital Recommended Level of Care: [place] Prior Approval Number:    Date Approved/Denied:   PASRR Number:    Discharge Plan: Other (Comment)(ALF)    Current Diagnoses: Patient Active Problem List   Diagnosis Date Noted  . Pneumonia 12/17/2017  . Altered mental status 07/10/2016  . Urinary incontinence 07/10/2016  . History of CVA (cerebrovascular accident) - by CT scan 07/10/2016  . Compression fracture of L2 lumbar vertebra (HCC) 11/25/2015  . Compression fracture of spine, non-traumatic ([place]) 11/25/2015  . Osteoporosis 11/25/2015  . Peripheral arterial disease ([place]) 11/25/2015  . Pyelonephritis, acute 11/23/2015  . UTI (urinary tract infection) 11/23/2015  . Community acquired pneumonia 07/11/2014  . Urinary tract infection, site not specified 03/01/2012  . Acute delirium 03/01/2012  . Essential hypertension, benign 03/01/2012  . Hypokalemia 03/01/2012    Orientation RESPIRATION BLADDER Height & Weight     Self  Normal Incontinent Weight: 106 lb 0.7 oz (48.1 kg) Height:  5\' 2"  (157.5 cm)  BEHAVIORAL SYMPTOMS/MOOD NEUROLOGICAL BOWEL NUTRITION STATUS      Incontinent Diet(mechanical soft (regular))  AMBULATORY STATUS COMMUNICATION OF NEEDS Skin   Limited Assist Verbally Normal                       Personal Care Assistance Level of Assistance  Bathing, Feeding, Dressing Bathing Assistance: Maximum assistance Feeding assistance: Maximum  assistance Dressing Assistance: Maximum assistance     Functional Limitations Info  Sight, Hearing, Speech Sight Info: Impaired(legally blind) Hearing Info: Impaired(hard of hearing) Speech Info: Adequate(speaks softly)    SPECIAL CARE FACTORS FREQUENCY                       Contractures Contractures Info: Not present    Additional Factors Info  Code Status, Allergies Code Status Info: DNR Allergies Info: Sulfa Antibiotics           Current Medications (12/21/2017):  This is the current hospital active medication list Current Facility-Administered Medications  Medication Dose Route Frequency Provider Last Rate Last Dose  . acetaminophen (TYLENOL) tablet 650 mg  650 mg Oral Q6H PRN Emokpae, Ejiroghene E, MD   650 mg at 12/20/17 2124   Or  . acetaminophen (TYLENOL) suppository 650 mg  650 mg Rectal Q6H PRN Emokpae, Ejiroghene E, MD      . amoxicillin-clavulanate (AUGMENTIN) 875-125 MG per tablet 1 tablet  1 tablet Oral Q12H Sinda Du, MD   1 tablet at 12/21/17 0855  . enoxaparin (LOVENOX) injection 30 mg  30 mg Subcutaneous Q24H Emokpae, Ejiroghene E, MD   30 mg at 12/20/17 2125  . Influenza vac split quadrivalent PF (FLUZONE HIGH-DOSE) injection 0.5 mL  0.5 mL Intramuscular Tomorrow-1000 Sinda Du, MD      . mirabegron ER [facility]) tablet 25 mg  25 mg Oral Daily Emokpae, Ejiroghene E, MD   25 mg at 12/21/17 0855  . ondansetron (ZOFRAN) tablet 4 mg  4 mg Oral Q6H PRN  Emokpae, Ejiroghene E, MD       Or  . ondansetron (ZOFRAN) injection 4 mg  4 mg Intravenous Q6H PRN Emokpae, Ejiroghene E, MD      . polyethylene glycol (MIRALAX / GLYCOLAX) packet 17 g  17 g Oral Daily PRN Emokpae, Ejiroghene E, MD      . sertraline (ZOLOFT) tablet 50 mg  50 mg Oral Daily Emokpae, Ejiroghene E, MD   50 mg at 12/21/17 0855  . traZODone (DESYREL) tablet 50 mg  50 mg Oral QHS Emokpae, Ejiroghene E, MD   50 mg at 12/20/17 2124     Discharge Medications:  Medication List     TAKE these medications   acetaminophen 500 MG tablet Commonly known as:  TYLENOL Take 1,000 mg by mouth at bedtime.   amLODipine-benazepril 10-20 MG capsule Commonly known as:  LOTREL Take 1 capsule by mouth daily.   amoxicillin-clavulanate 875-125 MG tablet Commonly known as:  AUGMENTIN Take 1 tablet by mouth 2 (two) times daily for 4 days.   conjugated estrogens vaginal cream Commonly known as:  PREMARIN Place 1 Applicatorful vaginally at bedtime.   MYRBETRIQ 25 MG Tb24 tablet Generic drug:  mirabegron ER Take 25 mg by mouth daily.   sertraline 50 MG tablet Commonly known as:  ZOLOFT Take 50 mg by mouth daily.   traZODone 50 MG tablet Commonly known as:  DESYREL Take 50 mg by mouth at bedtime.        Relevant Imaging Results:  Relevant Lab Results:   Additional Information    Shade Flood, LCSW

## 2017-12-21 NOTE — Progress Notes (Signed)
Subjective: She says she feels better.  No new complaints.  Anabiotic well  Objective: Vital signs in last 24 hours: Temp:  [97.4 F (36.3 C)-99.3 F (37.4 C)] 97.4 F (36.3 C) (12/05 0548) Pulse Rate:  [76-134] 86 (12/05 0548) Resp:  [18-20] 20 (12/05 0548) BP: (136-162)/(70-99) 162/83 (12/05 0548) SpO2:  [94 %-95 %] 95 % (12/05 0548) Weight change:  Last BM Date: 12/18/17  Intake/Output from previous day: 12/04 0701 - 12/05 0700 In: 60 [P.O.:60] Out: 2 [Urine:1; Stool:1]  PHYSICAL EXAM General appearance: alert, cooperative and no distress Resp: clear to auscultation bilaterally Cardio: regular rate and rhythm, S1, S2 normal, no murmur, click, rub or gallop GI: soft, non-tender; bowel sounds normal; no masses,  no organomegaly Extremities: extremities normal, atraumatic, no cyanosis or edema  Lab Results:  No results found for this or any previous visit (from the past 48 hour(s)).  ABGS No results for input(s): PHART, PO2ART, TCO2, HCO3 in the last 72 hours.  Invalid input(s): PCO2 CULTURES Recent Results (from the past 240 hour(s))  Culture, blood (routine x 2)     Status: None (Preliminary result)   Collection Time: 12/17/17  4:43 PM  Result Value Ref Range Status   Specimen Description LEFT ANTECUBITAL  Final   Special Requests   Final    BOTTLES DRAWN AEROBIC AND ANAEROBIC Blood Culture adequate volume   Culture   Final    NO GROWTH 4 DAYS Performed at [facility], [address], [place], [place] [address]    Report Status PENDING  Incomplete  Culture, blood (routine x 2)     Status: None (Preliminary result)   Collection Time: 12/17/17  5:31 PM  Result Value Ref Range Status   Specimen Description BLOOD RIGHT HAND  Final   Special Requests   Final    BOTTLES DRAWN AEROBIC AND ANAEROBIC Blood Culture results may not be optimal due to an inadequate volume of blood received in culture bottles   Culture   Final    NO GROWTH 4 DAYS Performed at [facility], [address] [place], [place] [address]    Report Status PENDING  Incomplete  Urine culture     Status: None   Collection Time: 12/17/17  9:45 PM  Result Value Ref Range Status   Specimen Description   Final    URINE, CLEAN CATCH Performed at [facility], [address] [place], [place] [address]    Special Requests   Final    NONE Performed at [facility], [address] [place], [place] [address]    Culture   Final    NO GROWTH Performed at [facility], [place] [address] [place], [place] [address]    Report Status 12/19/2017 FINAL  Final  MRSA PCR Screening     Status: None   Collection Time: 12/18/17 12:46 AM  Result Value Ref Range Status   MRSA by PCR NEGATIVE NEGATIVE Final    Comment:        The GeneXpert MRSA Assay (FDA approved for NASAL specimens only), is one component of a comprehensive MRSA colonization surveillance program. It is not intended to diagnose MRSA infection nor to guide or monitor treatment for MRSA infections. Performed at [facility], [address], [place],  [address]    Studies/Results: No results found.  Medications:  Prior to Admission:  Medications Prior to Admission  Medication Sig Dispense Refill Last Dose  . acetaminophen (TYLENOL) 500 MG tablet Take 1,000 mg by  mouth at bedtime.    12/16/2017 at Unknown time  . amLODipine-benazepril (LOTREL) 10-20 MG capsule Take 1 capsule by mouth daily.    12/17/2017 at Unknown time  . conjugated estrogens (PREMARIN) vaginal cream Place 1 Applicatorful vaginally at bedtime.   12/16/2017 at Unknown time  . mirabegron ER (MYRBETRIQ) 25 MG TB24 tablet Take 25 mg by mouth daily.   12/17/2017 at Unknown time  . sertraline (ZOLOFT) 50 MG tablet Take 50 mg by mouth daily.    12/17/2017 at Unknown time  . traZODone (DESYREL) 50 MG tablet Take 50 mg by mouth at bedtime.   12/16/2017 at Unknown time   Scheduled: . amoxicillin-clavulanate  1 tablet Oral Q12H  . enoxaparin (LOVENOX)  injection  30 mg Subcutaneous Q24H  . Influenza vac split quadrivalent PF  0.5 mL Intramuscular Tomorrow-1000  . mirabegron ER  25 mg Oral Daily  . sertraline  50 mg Oral Daily  . traZODone  50 mg Oral QHS   Continuous:  [id number] **OR** acetaminophen, ondansetron **OR** ondansetron (ZOFRAN) IV, polyethylene glycol  Assesment: She was admitted with pneumonia presumably from aspiration and she is much improved.  She has hypertension which is well controlled  She is had trouble with depression and that seems to be doing okay.  She has dementia which is fairly mild but she gets more confused with acute medical illnesses. Active Problems:   Essential hypertension, benign   Pneumonia    Plan: Continue treatments discharged to assisted living facility today    LOS: 4 days   Fathima Bartl L 12/21/2017, 8:54 AM

## 2017-12-22 LAB — CULTURE, BLOOD (ROUTINE X 2)
CULTURE: NO GROWTH
Culture: NO GROWTH
Special Requests: ADEQUATE

## 2018-02-03 ENCOUNTER — Inpatient Hospital Stay (HOSPITAL_COMMUNITY)
Admission: EM | Admit: 2018-02-03 | Discharge: 2018-02-14 | DRG: 177 | Disposition: A | Discharge status: Home Health | Payer: PPO | Attending: Pulmonary Disease | Admitting: Pulmonary Disease

## 2018-02-03 ENCOUNTER — Emergency Department (HOSPITAL_COMMUNITY): Payer: PPO

## 2018-02-03 ENCOUNTER — Other Ambulatory Visit: Payer: Self-pay

## 2018-02-03 ENCOUNTER — Encounter (HOSPITAL_COMMUNITY): Payer: Self-pay | Admitting: Emergency Medicine

## 2018-02-03 DIAGNOSIS — Z66 Do not resuscitate: Secondary | ICD-10-CM | POA: Diagnosis present

## 2018-02-03 DIAGNOSIS — F329 Major depressive disorder, single episode, unspecified: Secondary | ICD-10-CM | POA: Diagnosis not present

## 2018-02-03 DIAGNOSIS — I110 Hypertensive heart disease with heart failure: Secondary | ICD-10-CM | POA: Diagnosis present

## 2018-02-03 DIAGNOSIS — M1990 Unspecified osteoarthritis, unspecified site: Secondary | ICD-10-CM | POA: Diagnosis not present

## 2018-02-03 DIAGNOSIS — F419 Anxiety disorder, unspecified: Secondary | ICD-10-CM | POA: Diagnosis present

## 2018-02-03 DIAGNOSIS — J181 Lobar pneumonia, unspecified organism: Secondary | ICD-10-CM | POA: Diagnosis not present

## 2018-02-03 DIAGNOSIS — N3281 Overactive bladder: Secondary | ICD-10-CM | POA: Diagnosis not present

## 2018-02-03 DIAGNOSIS — I4819 Other persistent atrial fibrillation: Secondary | ICD-10-CM | POA: Diagnosis present

## 2018-02-03 DIAGNOSIS — F0390 Unspecified dementia without behavioral disturbance: Secondary | ICD-10-CM | POA: Diagnosis not present

## 2018-02-03 DIAGNOSIS — Z882 Allergy status to sulfonamides status: Secondary | ICD-10-CM | POA: Diagnosis not present

## 2018-02-03 DIAGNOSIS — J690 Pneumonitis due to inhalation of food and vomit: Secondary | ICD-10-CM | POA: Diagnosis not present

## 2018-02-03 DIAGNOSIS — N179 Acute kidney failure, unspecified: Secondary | ICD-10-CM | POA: Diagnosis present

## 2018-02-03 DIAGNOSIS — E860 Dehydration: Secondary | ICD-10-CM | POA: Diagnosis not present

## 2018-02-03 DIAGNOSIS — Z7189 Other specified counseling: Secondary | ICD-10-CM

## 2018-02-03 DIAGNOSIS — J9601 Acute respiratory failure with hypoxia: Secondary | ICD-10-CM | POA: Diagnosis present

## 2018-02-03 DIAGNOSIS — Z515 Encounter for palliative care: Secondary | ICD-10-CM | POA: Diagnosis not present

## 2018-02-03 DIAGNOSIS — R0600 Dyspnea, unspecified: Secondary | ICD-10-CM | POA: Diagnosis not present

## 2018-02-03 DIAGNOSIS — Z8673 Personal history of transient ischemic attack (TIA), and cerebral infarction without residual deficits: Secondary | ICD-10-CM

## 2018-02-03 DIAGNOSIS — Y95 Nosocomial condition: Secondary | ICD-10-CM | POA: Diagnosis present

## 2018-02-03 DIAGNOSIS — I509 Heart failure, unspecified: Secondary | ICD-10-CM | POA: Diagnosis present

## 2018-02-03 DIAGNOSIS — H9190 Unspecified hearing loss, unspecified ear: Secondary | ICD-10-CM | POA: Diagnosis present

## 2018-02-03 DIAGNOSIS — R05 Cough: Secondary | ICD-10-CM | POA: Diagnosis not present

## 2018-02-03 DIAGNOSIS — M810 Age-related osteoporosis without current pathological fracture: Secondary | ICD-10-CM | POA: Diagnosis not present

## 2018-02-03 DIAGNOSIS — R451 Restlessness and agitation: Secondary | ICD-10-CM

## 2018-02-03 DIAGNOSIS — Z79899 Other long term (current) drug therapy: Secondary | ICD-10-CM

## 2018-02-03 DIAGNOSIS — I10 Essential (primary) hypertension: Secondary | ICD-10-CM | POA: Diagnosis not present

## 2018-02-03 DIAGNOSIS — J189 Pneumonia, unspecified organism: Secondary | ICD-10-CM | POA: Diagnosis not present

## 2018-02-03 DIAGNOSIS — F411 Generalized anxiety disorder: Secondary | ICD-10-CM | POA: Diagnosis present

## 2018-02-03 LAB — CBC WITH DIFFERENTIAL/PLATELET
Abs Immature Granulocytes: 0.18 10*3/uL — ABNORMAL HIGH (ref 0.00–0.07)
BASOS ABS: 0.2 10*3/uL — AB (ref 0.0–0.1)
BASOS PCT: 1 %
Eosinophils Absolute: 0 10*3/uL (ref 0.0–0.5)
Eosinophils Relative: 0 %
HCT: 36.5 % (ref 36.0–46.0)
Hemoglobin: 11.7 g/dL — ABNORMAL LOW (ref 12.0–15.0)
Immature Granulocytes: 1 %
Lymphocytes Relative: 6 %
Lymphs Abs: 1.2 10*3/uL (ref 0.7–4.0)
MCH: 29.3 pg (ref 26.0–34.0)
MCHC: 32.1 g/dL (ref 30.0–36.0)
MCV: 91.3 fL (ref 80.0–100.0)
Monocytes Absolute: 1.7 10*3/uL — ABNORMAL HIGH (ref 0.1–1.0)
Monocytes Relative: 9 %
Neutro Abs: 15.3 10*3/uL — ABNORMAL HIGH (ref 1.7–7.7)
Neutrophils Relative %: 83 %
Platelets: 416 10*3/uL — ABNORMAL HIGH (ref 150–400)
RBC: 4 MIL/uL (ref 3.87–5.11)
RDW: 13.7 % (ref 11.5–15.5)
WBC: 18.5 10*3/uL — AB (ref 4.0–10.5)
nRBC: 0 % (ref 0.0–0.2)

## 2018-02-03 LAB — LACTIC ACID, PLASMA: Lactic Acid, Venous: 1.5 mmol/L (ref 0.5–1.9)

## 2018-02-03 LAB — COMPREHENSIVE METABOLIC PANEL
ALT: 15 U/L (ref 0–44)
AST: 17 U/L (ref 15–41)
Albumin: 3.5 g/dL (ref 3.5–5.0)
Alkaline Phosphatase: 105 U/L (ref 38–126)
Anion gap: 18 — ABNORMAL HIGH (ref 5–15)
BUN: 92 mg/dL — ABNORMAL HIGH (ref 8–23)
CALCIUM: 8.7 mg/dL — AB (ref 8.9–10.3)
CO2: 20 mmol/L — ABNORMAL LOW (ref 22–32)
Chloride: 93 mmol/L — ABNORMAL LOW (ref 98–111)
Creatinine, Ser: 2.74 mg/dL — ABNORMAL HIGH (ref 0.44–1.00)
GFR calc Af Amer: 16 mL/min — ABNORMAL LOW (ref >60)
GFR calc non Af Amer: 14 mL/min — ABNORMAL LOW (ref >60)
Glucose, Bld: 111 mg/dL — ABNORMAL HIGH (ref 70–99)
Potassium: 3.7 mmol/L (ref 3.5–5.1)
SODIUM: 131 mmol/L — AB (ref 135–145)
Total Bilirubin: 0.8 mg/dL (ref 0.3–1.2)
Total Protein: 7.9 g/dL (ref 6.5–8.1)

## 2018-02-03 LAB — EXPECTORATED SPUTUM ASSESSMENT W GRAM STAIN, RFLX TO RESP C

## 2018-02-03 LAB — URINALYSIS, ROUTINE W REFLEX MICROSCOPIC
Bilirubin Urine: NEGATIVE
Glucose, UA: NEGATIVE mg/dL
Hgb urine dipstick: NEGATIVE
Ketones, ur: 5 mg/dL — AB
Leukocytes, UA: NEGATIVE
Nitrite: NEGATIVE
Protein, ur: 30 mg/dL — AB
Specific Gravity, Urine: 1.02 (ref 1.005–1.030)
pH: 5 (ref 5.0–8.0)

## 2018-02-03 LAB — MRSA PCR SCREENING: MRSA by PCR: NEGATIVE

## 2018-02-03 MED ORDER — ESTROGENS, CONJUGATED 0.625 MG/GM VA CREA
1.0000 | TOPICAL_CREAM | Freq: Every day | VAGINAL | Status: DC
  Administered 2018-02-05 – 2018-02-06 (×2): 1 via VAGINAL
  Filled 2018-02-03: qty 30

## 2018-02-03 MED ORDER — TRAZODONE HCL 50 MG PO TABS
50.0000 mg | ORAL_TABLET | Freq: Every day | ORAL | Status: DC
  Administered 2018-02-05: 50 mg via ORAL
  Filled 2018-02-03 (×3): qty 1

## 2018-02-03 MED ORDER — ACETAMINOPHEN 325 MG PO TABS: 650.0000 mg | ORAL_TABLET | Freq: Four times a day (QID) | ORAL | Status: DC | PRN

## 2018-02-03 MED ORDER — IPRATROPIUM-ALBUTEROL 0.5-2.5 (3) MG/3ML IN SOLN
3.0000 mL | Freq: Four times a day (QID) | RESPIRATORY_TRACT | Status: DC
  Administered 2018-02-03 – 2018-02-05 (×9): 3 mL via RESPIRATORY_TRACT
  Filled 2018-02-03 (×9): qty 3

## 2018-02-03 MED ORDER — SODIUM CHLORIDE 0.9 % IV BOLUS
500.0000 mL | Freq: Once | INTRAVENOUS | Status: AC
  Administered 2018-02-03: 500 mL via INTRAVENOUS

## 2018-02-03 MED ORDER — ACETAMINOPHEN 325 MG PO TABS
650.0000 mg | ORAL_TABLET | Freq: Four times a day (QID) | ORAL | Status: DC | PRN
  Administered 2018-02-03 – 2018-02-12 (×5): 650 mg via ORAL
  Filled 2018-02-03 (×5): qty 2

## 2018-02-03 MED ORDER — SERTRALINE HCL 50 MG PO TABS
50.0000 mg | ORAL_TABLET | Freq: Every day | ORAL | Status: DC
  Administered 2018-02-04 – 2018-02-05 (×2): 50 mg via ORAL
  Filled 2018-02-03 (×2): qty 1

## 2018-02-03 MED ORDER — DEXTROSE 5 % IV SOLN
500.0000 mg | INTRAVENOUS | Status: DC
  Administered 2018-02-04: 500 mg via INTRAVENOUS
  Filled 2018-02-03 (×2): qty 0.5

## 2018-02-03 MED ORDER — SODIUM CHLORIDE 0.9 % IV SOLN
1.0000 g | Freq: Once | INTRAVENOUS | Status: AC
  Administered 2018-02-03: 1 g via INTRAVENOUS
  Filled 2018-02-03: qty 1

## 2018-02-03 MED ORDER — GUAIFENESIN ER 600 MG PO TB12
1200.0000 mg | ORAL_TABLET | Freq: Two times a day (BID) | ORAL | Status: DC
  Administered 2018-02-04 – 2018-02-05 (×3): 1200 mg via ORAL
  Filled 2018-02-03 (×7): qty 2

## 2018-02-03 MED ORDER — POTASSIUM CHLORIDE IN NACL 20-0.9 MEQ/L-% IV SOLN
INTRAVENOUS | Status: DC
  Administered 2018-02-03 – 2018-02-05 (×3): via INTRAVENOUS

## 2018-02-03 MED ORDER — VANCOMYCIN HCL IN DEXTROSE 1-5 GM/200ML-% IV SOLN
1000.0000 mg | Freq: Once | INTRAVENOUS | Status: AC
  Administered 2018-02-03: 1000 mg via INTRAVENOUS
  Filled 2018-02-03: qty 200

## 2018-02-03 MED ORDER — MIRABEGRON ER 25 MG PO TB24
25.0000 mg | ORAL_TABLET | Freq: Every day | ORAL | Status: DC
  Administered 2018-02-04 – 2018-02-05 (×2): 25 mg via ORAL
  Filled 2018-02-03 (×4): qty 1

## 2018-02-03 MED ORDER — VANCOMYCIN HCL 500 MG IV SOLR
500.0000 mg | INTRAVENOUS | Status: DC
  Administered 2018-02-05: 500 mg via INTRAVENOUS
  Filled 2018-02-03: qty 500

## 2018-02-03 MED ORDER — HEPARIN SODIUM (PORCINE) 5000 UNIT/ML IJ SOLN
5000.0000 [IU] | Freq: Three times a day (TID) | INTRAMUSCULAR | Status: DC
  Administered 2018-02-04 – 2018-02-06 (×3): 5000 [IU] via SUBCUTANEOUS
  Filled 2018-02-03 (×7): qty 1

## 2018-02-03 MED ORDER — ONDANSETRON HCL 4 MG/2ML IJ SOLN: 4.0000 mg | Freq: Four times a day (QID) | INTRAMUSCULAR | Status: DC | PRN

## 2018-02-03 NOTE — H&P (Signed)
History and Physical    [name] EZM:[id number] DOB: Dec 30, 1920 DOA: 02/03/2018  PCP: Sinda Du, MD  Patient coming from: [place]  I have personally briefly reviewed patient's old medical records in [place]  Chief Complaint: Shortness of breath  HPI: MORENIKE [name] is a 85 y.o. female with medical history significant of hypertension, dementia, who is extremely hard of hearing and therefore history is somewhat limited.  Patient is brought to the hospital with 2 to 3 days of progressive shortness of breath.  She has had decreased p.o. intake.  She has had progressive shortness of breath and cough.  She has been increasingly weak.  She arrived to the emergency room where she was noted to have oxygen saturation of 70s on room air.  Oxygen was applied and saturations improved to 90s.  She was noted to be dehydrated and pale looking.  Labs indicate development of new acute kidney injury.  Chest x-ray indicated pneumonia.  She is also found to have a significant leukocytosis.  She has been referred for admission.  Review of Systems: As per HPI otherwise 10 point review of systems negative.    Past Medical History:  Diagnosis Date  . Anxiety   . Dementia ([place])   . Hypertension   . Overactive bladder   . Pneumonia   . Stroke [facility])     Past Surgical History:  Procedure Laterality Date  . HIP ADDUCTOR TENOTOMY    . JOINT REPLACEMENT      Social History:  reports that she has never smoked. She has never used smokeless tobacco. She reports that she does not drink alcohol or use drugs.  Allergies  Allergen Reactions  . Sulfa Antibiotics     Family history: Family history reviewed and not pertinent  Prior to Admission medications   Medication Sig Start Date End Date Taking? Authorizing Provider  acetaminophen (TYLENOL) 500 MG tablet Take 1,000 mg by mouth at bedtime.    Yes [provider]  amLODipine-benazepril (LOTREL) 10-20 MG capsule Take 1 capsule  by mouth daily.  06/27/16  Yes [provider]  conjugated estrogens (PREMARIN) vaginal cream Place 1 Applicatorful vaginally at bedtime.   Yes [provider]  mirabegron ER (MYRBETRIQ) 25 MG TB24 tablet Take 25 mg by mouth daily.   Yes [provider]  sertraline (ZOLOFT) 50 MG tablet Take 50 mg by mouth daily.  06/24/16  Yes [provider]  traZODone (DESYREL) 50 MG tablet Take 50 mg by mouth at bedtime.   Yes [provider]    Physical Exam: Vitals:   02/03/18 1230 02/03/18 1300 02/03/18 1330 02/03/18 1414  BP: (!) 126/52 121/63 120/62 (!) 104/59  Pulse: 100 (!) 109 95 93  Resp: (!) 26 15 (!) 27 (!) 26  Temp:    98.6 F (37 C)  TempSrc:    Oral  SpO2: 93% 94% 94% 95%  Weight:    45.3 kg  Height:    5\' 2"  (1.575 m)    Constitutional: NAD, calm, comfortable Eyes: PERRL, lids and conjunctivae normal ENMT: Mucous membranes are dry. Posterior pharynx clear of any exudate or lesions.Normal dentition.  Neck: normal, supple, no masses, no thyromegaly Respiratory: Bilateral rhonchi.  Mild increased respiratory effort. No accessory muscle use.  Cardiovascular: Regular rate and rhythm, no murmurs / rubs / gallops. No extremity edema. 2+ pedal pulses. No carotid bruits.  Abdomen: no tenderness, no masses palpated. No hepatosplenomegaly. Bowel sounds positive.  Musculoskeletal: no clubbing /  cyanosis. No joint deformity upper and lower extremities. Good ROM, no contractures. Normal muscle tone.  Skin: no rashes, lesions, ulcers. No induration Neurologic: CN 2-12 grossly intact. Sensation intact, DTR normal. Strength 5/5 in all 4.  Psychiatric: Appears to be mildly confused   Labs on Admission: I have personally reviewed following labs and imaging studies  CBC: Recent Labs  Lab 02/03/18 0945  WBC 18.5*  NEUTROABS 15.3*  HGB 11.7*  HCT 36.5  MCV 91.3  PLT 161*   Basic Metabolic Panel: Recent Labs  Lab 02/03/18 0945  NA 131*  K 3.7    CL 93*  CO2 20*  GLUCOSE 111*  BUN 92*  CREATININE 2.74*  CALCIUM 8.7*   GFR: Estimated Creatinine Clearance: 8.8 mL/min (A) (by C-G formula based on SCr of 2.74 mg/dL (H)). Liver Function Tests: Recent Labs  Lab 02/03/18 0945  AST 17  ALT 15  ALKPHOS 105  BILITOT 0.8  PROT 7.9  ALBUMIN 3.5   No results for input(s): LIPASE, AMYLASE in the last 168 hours. No results for input(s): AMMONIA in the last 168 hours. Coagulation Profile: No results for input(s): INR, PROTIME in the last 168 hours. Cardiac Enzymes: No results for input(s): CKTOTAL, CKMB, CKMBINDEX, TROPONINI in the last 168 hours. BNP (last 3 results) No results for input(s): PROBNP in the last 8760 hours. HbA1C: No results for input(s): HGBA1C in the last 72 hours. CBG: No results for input(s): GLUCAP in the last 168 hours. Lipid Profile: No results for input(s): CHOL, HDL, LDLCALC, TRIG, CHOLHDL, LDLDIRECT in the last 72 hours. Thyroid Function Tests: No results for input(s): TSH, T4TOTAL, FREET4, T3FREE, THYROIDAB in the last 72 hours. Anemia Panel: No results for input(s): VITAMINB12, FOLATE, FERRITIN, TIBC, IRON, RETICCTPCT in the last 72 hours. Urine analysis:    Component Value Date/Time   COLORURINE STRAW (A) 12/17/2017 2145   APPEARANCEUR CLEAR 12/17/2017 2145   LABSPEC 1.006 12/17/2017 2145   PHURINE 6.0 12/17/2017 2145   GLUCOSEU NEGATIVE 12/17/2017 2145   HGBUR MODERATE (A) 12/17/2017 2145   BILIRUBINUR NEGATIVE 12/17/2017 2145   KETONESUR NEGATIVE 12/17/2017 2145   PROTEINUR NEGATIVE 12/17/2017 2145   UROBILINOGEN 0.2 02/28/2012 1830   NITRITE NEGATIVE 12/17/2017 2145   LEUKOCYTESUR MODERATE (A) 12/17/2017 2145    Radiological Exams on Admission: Dg Chest 2 View  Result Date: 02/03/2018 CLINICAL DATA:  Cough EXAM: CHEST - 2 VIEW COMPARISON:  December 17, 2017 FINDINGS: There is airspace consolidation in the posterior left base with small left pleural effusion. The lungs elsewhere are  clear. Heart is upper normal in size with pulmonary vascularity normal. No adenopathy. There is aortic atherosclerosis. Bones appear somewhat osteoporotic. IMPRESSION: Airspace consolidation in the posterior left base, felt to represent focal pneumonia. Small left pleural effusion. Lungs elsewhere clear. Stable cardiac silhouette. There is aortic atherosclerosis. Aortic Atherosclerosis (ICD10-I70.0). Followup PA and lateral chest radiographs recommended in 3-4 weeks following trial of antibiotic therapy to ensure resolution and exclude underlying malignancy. Electronically Signed   By: Lowella Grip III M.D.   On: 02/03/2018 10:37    EKG: Independently reviewed. Atrial fibrillation  Assessment/Plan Active Problems:   Essential hypertension, benign   HCAP (healthcare-associated pneumonia)   AKI (acute kidney injury) ([place])   Dehydration   Acute respiratory failure with hypoxia ([place])     1. Acute respiratory failure with hypoxia.  Reported by ER staff the patient had oxygen saturations in the 70s on room air.  This is improved to 90s on oxygen.  We will try and wean off as tolerated. 2. Healthcare associated pneumonia.  Started on vancomycin and cefepime.  MRSA PCR is in process.  Continue current treatments. 3. Acute kidney injury secondary to dehydration.  Will provide gentle hydration and recheck labs in a.m.  If no improvement, can consider further work-up. 4. Hypertension.  She is chronically on amlodipine and ACE inhibitor.  These will be held in the setting of renal failure. 5. Possible atrial fibrillation.  Will repeat EKG.  Monitor on telemetry.  If patient does maintain atrial fibrillation, can consider further work-up.  Heart rate is currently stable.  If she does not fact have persistent atrial fibrillation, due to her advanced age, she would likely not be an appropriate candidate for anticoagulation.  DVT prophylaxis: Heparin  Code Status: DNR Family Communication: No family  present Disposition Plan: Discharge back to skilled nursing facility when respiratory status has stabilized Consults called:   Admission status: Inpatient, telemetry  Kathie Dike MD [facility]   If 7PM-7AM, please contact night-coverage [email or web address]   02/03/2018, 4:36 PM

## 2018-02-03 NOTE — ED Notes (Signed)
First set of blood cultures obtained 

## 2018-02-03 NOTE — Progress Notes (Signed)
PHARMACY NOTE:  ANTIMICROBIAL RENAL DOSAGE ADJUSTMENT  Current antimicrobial regimen includes a mismatch between antimicrobial dosage and estimated renal function.  As per policy approved by the Pharmacy & Therapeutics and Medical Executive Committees, the antimicrobial dosage will be adjusted accordingly.  Current antimicrobial dosage:  Cefepime 1 gram IV q8h  Indication: Pneumonia  Renal Function:  Estimated Creatinine Clearance: 8.8 mL/min (A) (by C-G formula based on SCr of 2.74 mg/dL (H)). []      On intermittent HD, scheduled: []      On CRRT    Antimicrobial dosage has been changed to:  Cefepime 500mg  IV q24h. Pt received 1 gram dose in ED ~ 1129 on 1/18  Additional comments: Will continue to monitor renal function and adjust accordingly.    Thank you for allowing pharmacy to be a part of this patient's care.  Vernie Ammons, [facility] 02/03/2018 4:42 PM

## 2018-02-03 NOTE — ED Notes (Signed)
Patient on 10 day course of antibiotics (amoxicillin), patient has taken 5 days-staff member that brought patient unsure of why patient is taking medication.

## 2018-02-03 NOTE — NC FL2 (Addendum)
[place] MEDICAID FL2 LEVEL OF CARE SCREENING TOOL     IDENTIFICATION  Patient Name: [name] Birthdate: [birth year] Sex: female Admission Date (Current Location): 02/03/2018  [facility] and [place] Number:  [employer] and Address:  [place] [address] [place]      Provider Number: [id number]  Attending Physician Name and Address:  Sinda Du, MD  Relative Name and Phone Number:   Daughter Tally Joe [phone]    Current Level of Care: Hospital Recommended Level of Care: [place] Prior Approval Number:    Date Approved/Denied:   PASRR Number:   [id number]  Discharge Plan: SNF    Current Diagnoses: Patient Active Problem List   Diagnosis Date Noted  . HCAP (healthcare-associated pneumonia) 02/03/2018  . AKI (acute kidney injury) ([place]) 02/03/2018  . Dehydration 02/03/2018  . Acute respiratory failure with hypoxia ([place]) 02/03/2018  . Pneumonia 12/17/2017  . Altered mental status 07/10/2016  . Urinary incontinence 07/10/2016  . History of CVA (cerebrovascular accident) - by CT scan 07/10/2016  . Compression fracture of L2 lumbar vertebra (HCC) 11/25/2015  . Compression fracture of spine, non-traumatic ([place]) 11/25/2015  . Osteoporosis 11/25/2015  . Peripheral arterial disease ([place]) 11/25/2015  . Pyelonephritis, acute 11/23/2015  . UTI (urinary tract infection) 11/23/2015  . Community acquired pneumonia 07/11/2014  . Urinary tract infection, site not specified 03/01/2012  . Acute delirium 03/01/2012  . Essential hypertension, benign 03/01/2012  . Hypokalemia 03/01/2012    Orientation RESPIRATION BLADDER Height & Weight     Self  O2(3 litres) Incontinent Weight: 99 lb 13.9 oz (45.3 kg) Height:  5\' 2"  (157.5 cm)  BEHAVIORAL SYMPTOMS/MOOD NEUROLOGICAL BOWEL NUTRITION STATUS      Incontinent    AMBULATORY STATUS COMMUNICATION OF NEEDS Skin   Limited Assist Verbally Normal                        Personal Care Assistance Level of Assistance  Bathing, Feeding, Dressing, Total care Bathing Assistance: Limited assistance Feeding assistance: Limited assistance Dressing Assistance: Limited assistance Total Care Assistance: Limited assistance   Functional Limitations Info  Sight, Hearing, Speech Sight Info: Adequate Hearing: Hard of hearing Speech Info: Adequate    SPECIAL CARE FACTORS FREQUENCY                       Contractures Contractures Info: Not present    Additional Factors Info  Allergies, Code Status Code Status Info: DNR Allergies Info: Sulfa Antibiotics           Current Medications (02/03/2018):  This is the current hospital active medication list Current Facility-Administered Medications  Medication Dose Route Frequency Provider Last Rate Last Dose  . 0.9 % NaCl with KCl 20 mEq/ L  infusion   Intravenous Continuous Kathie Dike, MD 75 mL/hr at 02/03/18 1719    . acetaminophen (TYLENOL) tablet 650 mg  650 mg Oral Q6H PRN Kathie Dike, MD      . Derrill Memo ON 02/04/2018] ceFEPIme (MAXIPIME) 500 mg in dextrose 5 % 50 mL IVPB  500 mg Intravenous Q24H Kathie Dike, MD      . conjugated estrogens (PREMARIN) vaginal cream 1 Applicatorful  1 Applicatorful Vaginal QHS Memon, Jolaine Artist, MD      . guaiFENesin (MUCINEX) 12 hr tablet 1,200 mg  1,200 mg Oral BID Kathie Dike, MD      . heparin injection 5,000 Units  5,000 Units Subcutaneous Q8H  Kathie Dike, MD      . ipratropium-albuterol (DUONEB) 0.5-2.5 (3) MG/3ML nebulizer solution 3 mL  3 mL Nebulization Q6H Kathie Dike, MD      . Derrill Memo ON 02/04/2018] mirabegron ER (MYRBETRIQ) tablet 25 mg  25 mg Oral Daily Kathie Dike, MD      . ondansetron (ZOFRAN) injection 4 mg  4 mg Intravenous Q6H PRN Kathie Dike, MD      . Derrill Memo ON 02/04/2018] sertraline (ZOLOFT) tablet 50 mg  50 mg Oral Daily Kathie Dike, MD      . traZODone (DESYREL) tablet 50 mg  50 mg Oral QHS Kathie Dike, MD       . Derrill Memo ON 02/05/2018] vancomycin (VANCOCIN) 500 mg in sodium chloride 0.9 % 100 mL IVPB  500 mg Intravenous Q48H Sinda Du, MD         Discharge Medications: Please see discharge summary for a list of discharge medications.  Relevant Imaging Results:  Relevant Lab Results:   Additional Information SSN [id number], [place], [place]

## 2018-02-03 NOTE — ED Provider Notes (Addendum)
[facility] EMERGENCY DEPARTMENT Provider Note   CSN: [id number] Arrival date & time: 02/03/18  1884     History   Chief Complaint No chief complaint on file.   HPI [name] is a 85 y.o. female.  The history is provided by the patient. No language interpreter was used.  Cough  Cough characteristics:  Productive Sputum characteristics:  Green Severity:  Moderate Onset quality:  Gradual Timing:  Constant Progression:  Worsening Chronicity:  New Smoker: no   Relieved by:  Nothing Worsened by:  Nothing Ineffective treatments:  None tried Associated symptoms: fever   Pt has not ate or drank in the last 3 days.  Staff from facility reports pt is coughing and has some difficulty breathing  Past Medical History:  Diagnosis Date  . Anxiety   . Dementia ([place])   . Hypertension   . Overactive bladder   . Pneumonia   . Stroke [facility])     Patient Active Problem List   Diagnosis Date Noted  . Pneumonia 12/17/2017  . Altered mental status 07/10/2016  . Urinary incontinence 07/10/2016  . History of CVA (cerebrovascular accident) - by CT scan 07/10/2016  . Compression fracture of L2 lumbar vertebra (HCC) 11/25/2015  . Compression fracture of spine, non-traumatic ([place]) 11/25/2015  . Osteoporosis 11/25/2015  . Peripheral arterial disease ([place]) 11/25/2015  . Pyelonephritis, acute 11/23/2015  . UTI (urinary tract infection) 11/23/2015  . Community acquired pneumonia 07/11/2014  . Urinary tract infection, site not specified 03/01/2012  . Acute delirium 03/01/2012  . Essential hypertension, benign 03/01/2012  . Hypokalemia 03/01/2012    Past Surgical History:  Procedure Laterality Date  . HIP ADDUCTOR TENOTOMY    . JOINT REPLACEMENT       OB History   No obstetric history on file.      Home Medications    Prior to Admission medications   Medication Sig Start Date End Date Taking? Authorizing Provider  acetaminophen (TYLENOL) 500 MG tablet Take 1,000 mg by  mouth at bedtime.     [provider]  amLODipine-benazepril (LOTREL) 10-20 MG capsule Take 1 capsule by mouth daily.  06/27/16   [provider]  conjugated estrogens (PREMARIN) vaginal cream Place 1 Applicatorful vaginally at bedtime.    [provider]  mirabegron ER (MYRBETRIQ) 25 MG TB24 tablet Take 25 mg by mouth daily.    [provider]  sertraline (ZOLOFT) 50 MG tablet Take 50 mg by mouth daily.  06/24/16   [provider]  traZODone (DESYREL) 50 MG tablet Take 50 mg by mouth at bedtime.    [provider]    Family History No family history on file.  Social History Social History   Tobacco Use  . Smoking status: Never Smoker  . Smokeless tobacco: Never Used  Substance Use Topics  . Alcohol use: No  . Drug use: No     Allergies   Sulfa antibiotics   Review of Systems Review of Systems  Constitutional: Positive for fever.  Respiratory: Positive for cough.   All other systems reviewed and are negative.    Physical Exam Updated Vital Signs There were no vitals taken for this visit.  Physical Exam Vitals signs and nursing note reviewed.  Constitutional:      Appearance: She is well-developed.  HENT:     Head: Normocephalic.     Right Ear: Tympanic membrane normal.     Nose: Nose normal.     Mouth/Throat:  Pharynx: Oropharynx is clear.  Eyes:     Conjunctiva/sclera: Conjunctivae normal.  Neck:     Musculoskeletal: Normal range of motion.  Cardiovascular:     Rate and Rhythm: Normal rate.  Pulmonary:     Breath sounds: Rhonchi present.  Abdominal:     General: There is no distension.  Musculoskeletal: Normal range of motion.  Skin:    General: Skin is warm.  Neurological:     Mental Status: She is alert and oriented to person, place, and time.  Psychiatric:        Mood and Affect: Mood normal.      ED Treatments / Results  Labs (all labs ordered are listed, but only abnormal results are  displayed) Labs Reviewed - No data to display  EKG None  Radiology No results found.  Procedures Procedures (including critical care time)  Medications Ordered in ED Medications - No data to display   Initial Impression / Assessment and Plan / ED Course  I have reviewed the triage vital signs and the nursing notes.  Pertinent labs & imaging results that were available during my care of the patient were reviewed by me and considered in my medical decision making (see chart for details).     MDM  Chest xray shows pneumonia,  Pt has elevated wbc count.  IV antibiotics ordered.  Hospitalist consulted to admit.  Pt is from [facility] facility.  Pt was in hospital Dec 1.   Pt place on 02 and pulse ox improved to 85% was 75 on room air   Final Clinical Impressions(s) / ED Diagnoses   Final diagnoses:  Community acquired pneumonia, unspecified laterality  Acute renal injury [facility])    ED Discharge Orders    None       Fransico Meadow, PA-C 02/03/18 1135    Fransico Meadow, PA-C 02/03/18 [place], [place], DO 02/04/18 [phone]

## 2018-02-03 NOTE — ED Triage Notes (Signed)
Patient brought from [facility] home. Per staff patient not eating x3 days with cough, wheezing, and generalized weakness. Patient c/o right leg pain. Unsure of injury. O2 sat 79%.

## 2018-02-03 NOTE — Progress Notes (Signed)
Pharmacy Antibiotic Note  [name] is a 85 y.o. female admitted on 02/03/2018 with pneumonia and acute kidney injury secondary to dehydration.  Pharmacy has been consulted for Vancomycin dosing.  Plan: Vancomycin 1000 mg IV x 1 dose. Vancomycin 500 mg IV every 48 hours.  Goal trough 15-20 mcg/mL.  Cefepime 500 mg IV every 24 hours. Monitor labs, c/s, and vanco trough as indicated.  Height: 5\' 2"  (157.5 cm) Weight: 99 lb 13.9 oz (45.3 kg) IBW/kg (Calculated) : 50.1  Temp (24hrs), Avg:98.4 F (36.9 C), Min:98.1 F (36.7 C), Max:98.6 F (37 C)  Recent Labs  Lab 02/03/18 0945  WBC 18.5*  CREATININE 2.74*  LATICACIDVEN 1.5    Estimated Creatinine Clearance: 8.8 mL/min (A) (by C-G formula based on SCr of 2.74 mg/dL (H)).    Allergies  Allergen Reactions  . Sulfa Antibiotics     Antimicrobials this admission: Vanco 1/18 >>  Cefepime 1/18 >>   Dose adjustments this admission: Vanco/Cefepime  Microbiology results: 1/181/18 BCx: pending   1/18 Sputum: pending  1/18 MRSA PCR: pending  Thank you for allowing pharmacy to be a part of this patient's care.  Ramond Craver 02/03/2018 5:32 PM

## 2018-02-04 LAB — COMPREHENSIVE METABOLIC PANEL
ALT: 14 U/L (ref 0–44)
AST: 14 U/L — ABNORMAL LOW (ref 15–41)
Albumin: 2.9 g/dL — ABNORMAL LOW (ref 3.5–5.0)
Alkaline Phosphatase: 90 U/L (ref 38–126)
Anion gap: 14 (ref 5–15)
BUN: 87 mg/dL — ABNORMAL HIGH (ref 8–23)
CO2: 19 mmol/L — AB (ref 22–32)
Calcium: 8.3 mg/dL — ABNORMAL LOW (ref 8.9–10.3)
Chloride: 104 mmol/L (ref 98–111)
Creatinine, Ser: 1.69 mg/dL — ABNORMAL HIGH (ref 0.44–1.00)
GFR calc Af Amer: 29 mL/min — ABNORMAL LOW (ref >60)
GFR calc non Af Amer: 25 mL/min — ABNORMAL LOW (ref >60)
Glucose, Bld: 89 mg/dL (ref 70–99)
Potassium: 3.7 mmol/L (ref 3.5–5.1)
Sodium: 137 mmol/L (ref 135–145)
Total Bilirubin: 0.9 mg/dL (ref 0.3–1.2)
Total Protein: 6.4 g/dL — ABNORMAL LOW (ref 6.5–8.1)

## 2018-02-04 LAB — STREP PNEUMONIAE URINARY ANTIGEN: Strep Pneumo Urinary Antigen: NEGATIVE

## 2018-02-04 LAB — CBC
HCT: 34 % — ABNORMAL LOW (ref 36.0–46.0)
Hemoglobin: 10.7 g/dL — ABNORMAL LOW (ref 12.0–15.0)
MCH: 29.7 pg (ref 26.0–34.0)
MCHC: 31.5 g/dL (ref 30.0–36.0)
MCV: 94.4 fL (ref 80.0–100.0)
Platelets: 396 10*3/uL (ref 150–400)
RBC: 3.6 MIL/uL — ABNORMAL LOW (ref 3.87–5.11)
RDW: 13.9 % (ref 11.5–15.5)
WBC: 16.3 10*3/uL — ABNORMAL HIGH (ref 4.0–10.5)
nRBC: 0 % (ref 0.0–0.2)

## 2018-02-04 MED ORDER — SODIUM CHLORIDE 0.9 % IV SOLN
1.0000 g | INTRAVENOUS | Status: DC
  Administered 2018-02-05 – 2018-02-06 (×2): 1 g via INTRAVENOUS
  Filled 2018-02-04 (×3): qty 1

## 2018-02-04 NOTE — Progress Notes (Signed)
LCSW called [facility] and spoke to unit Nurse Sheena. Patient appears to be improved. Patient is from [place] called and spoke to [facility] confirmed patient can return on Monday.( [facility] closed on Sundays- they cant accommodate patient if there is med changes until Monday)  [place] LCSW  [phone]

## 2018-02-04 NOTE — Evaluation (Signed)
Physical Therapy Evaluation Patient Details Name: [name] MRN: [id number] DOB: 1920/07/13 Today's Date: 02/04/2018   History of Present Illness  [name] is a 85 y.o. female with medical history significant of hypertension, dementia, who is extremely hard of hearing and therefore history is somewhat limited.  Patient is brought to the hospital with 2 to 3 days of progressive shortness of breath.  She has had decreased p.o. intake.  She has had progressive shortness of breath and cough.  She has been increasingly weak.  She arrived to the emergency room where she was noted to have oxygen saturation of 70s on room air.  Oxygen was applied and saturations improved to 90s.  She was noted to be dehydrated and pale looking.  Labs indicate development of new acute kidney injury.  Chest x-ray indicated pneumonia.  She is also found to have a significant leukocytosis.  She has been referred for admission.    Clinical Impression  Patient functioning well below baseline and limited for functional mobility and gait as stated below secondary to BLE weakness, fatigue, SOB with exertion and poor standing balance.  Patient will benefit from continued physical therapy in hospital and recommended venue below to increase strength, balance, endurance for safe ADLs and gait.    Follow Up Recommendations SNF;Supervision/Assistance - 24 hour;Supervision for mobility/OOB    Equipment Recommendations  None recommended by PT    Recommendations for Other Services       Precautions / Restrictions Precautions Precautions: Fall Restrictions Weight Bearing Restrictions: No      Mobility  Bed Mobility Overal bed mobility: Needs Assistance Bed Mobility: Supine to Sit     Supine to sit: Min assist     General bed mobility comments: slow labored movement with difficulty propping up on elbows due to weakness  Transfers Overall transfer level: Needs assistance Equipment used: Rolling walker (2  wheeled) Transfers: Sit to/from [facility] Sit to Stand: Min assist Stand pivot transfers: Min assist;Mod assist       General transfer comment: unsteady on feet, labored movement  Ambulation/Gait Ambulation/Gait assistance: Mod assist Gait Distance (Feet): 4 Feet Assistive device: Rolling walker (2 wheeled) Gait Pattern/deviations: Decreased step length - right;Decreased step length - left;Decreased stride length Gait velocity: slow   General Gait Details: limited to 5-6 slow unsteady labored steps at bedside due to c/o fatigue while on 5 LPM  Stairs            Wheelchair Mobility    Modified Rankin (Stroke Patients Only)       Balance Overall balance assessment: Needs assistance Sitting-balance support: Feet supported;No upper extremity supported Sitting balance-Leahy Scale: Fair     Standing balance support: Bilateral upper extremity supported Standing balance-Leahy Scale: Fair Standing balance comment: using RW                             Pertinent Vitals/Pain Pain Assessment: No/denies pain    Home Living Family/patient expects to be discharged to:: Assisted living               Home Equipment: Walker - 2 wheels      Prior Function Level of Independence: Needs assistance   Gait / Transfers Assistance Needed: Supervised household ambulator with RW  ADL's / Homemaking Assistance Needed: assisted by ALF staff        Hand Dominance   Dominant Hand: Right    Extremity/Trunk Assessment   Upper  Extremity Assessment Upper Extremity Assessment: Generalized weakness    Lower Extremity Assessment Lower Extremity Assessment: Generalized weakness    Cervical / Trunk Assessment Cervical / Trunk Assessment: Normal  Communication   Communication: HOH  Cognition Arousal/Alertness: Awake/alert Behavior During Therapy: WFL for tasks assessed/performed Overall Cognitive Status: Within Functional Limits for tasks  assessed                                        General Comments      Exercises     Assessment/Plan    PT Assessment Patient needs continued PT services  PT Problem List Decreased strength;Decreased activity tolerance;Decreased balance;Decreased mobility       PT Treatment Interventions Gait training;Functional mobility training;Therapeutic activities;Patient/family education;Stair training;Therapeutic exercise    PT Goals (Current goals can be found in the Care Plan section)  Acute Rehab PT Goals Patient Stated Goal: return home after rehab PT Goal Formulation: With patient/family Time For Goal Achievement: 02/18/18 Potential to Achieve Goals: Good    Frequency Min 3X/week   Barriers to discharge        Co-evaluation               AM-PAC PT "6 Clicks" Mobility  Outcome Measure Help needed turning from your back to your side while in a flat bed without using bedrails?: A Lot Help needed moving from lying on your back to sitting on the side of a flat bed without using bedrails?: A Lot Help needed moving to and from a bed to a chair (including a wheelchair)?: A Lot Help needed standing up from a chair using your arms (e.g., wheelchair or bedside chair)?: A Lot Help needed to walk in hospital room?: A Lot Help needed climbing 3-5 steps with a railing? : A Lot 6 Click Score: 12    End of Session Equipment Utilized During Treatment: Oxygen Activity Tolerance: Patient tolerated treatment well;Patient limited by fatigue Patient left: in chair;with call bell/phone within reach;with chair alarm set;with family/visitor present Nurse Communication: Mobility status PT Visit Diagnosis: Unsteadiness on feet (R26.81);Other abnormalities of gait and mobility (R26.89);Muscle weakness (generalized) (M62.81)    Time: 1610-9604 PT Time Calculation (min) (ACUTE ONLY): 34 min   Charges:   PT Evaluation $PT Eval High Complexity: 1 High          12:17 PM,  02/04/18 Lonell Grandchild, MPT Physical Therapist with [facility] [phone] office [phone] mobile phone

## 2018-02-04 NOTE — Progress Notes (Signed)
Subjective: She was admitted with pneumonia hypoxia significant dehydration and acute kidney injury.  At baseline she has dementia and hypertension and severe arthritis.  She is very hard of hearing so communication is difficult.  She says she is still short of breath.  She does not have any other new complaints but as mentioned she is very hard of hearing and she has dementia so history is questionable  Objective: Vital signs in last 24 hours: Temp:  [97.5 F (36.4 C)-98.6 F (37 C)] 98.1 F (36.7 C) (01/19 0647) Pulse Rate:  [72-109] 106 (01/19 0647) Resp:  [15-31] 26 (01/19 0647) BP: (95-127)/(43-70) 102/55 (01/19 0647) SpO2:  [92 %-98 %] 96 % (01/19 0746) Weight:  [45.3 kg] 45.3 kg (01/18 1414) Weight change:     Intake/Output from previous day: 01/18 0701 - 01/19 0700 In: 1376.8 [P.O.:120; I.V.:456.4; IV Piggyback:800.4] Out: 100 [Urine:100]  PHYSICAL EXAM General appearance: alert, mild distress and Confused Resp: rhonchi bilaterally Cardio: She seems to be in atrial fib by exam GI: soft, non-tender; bowel sounds normal; no masses,  no organomegaly Extremities: extremities normal, atraumatic, no cyanosis or edema  Lab Results:  Results for orders placed or performed during the hospital encounter of 02/03/18 (from the past 48 hour(s))  CBC with Differential/Platelet     Status: Abnormal   Collection Time: 02/03/18  9:45 AM  Result Value Ref Range   WBC 18.5 (H) 4.0 - 10.5 K/uL   RBC 4.00 3.87 - 5.11 MIL/uL   Hemoglobin 11.7 (L) 12.0 - 15.0 g/dL   HCT 36.5 36.0 - 46.0 %   MCV 91.3 80.0 - 100.0 fL   MCH 29.3 26.0 - 34.0 pg   MCHC 32.1 30.0 - 36.0 g/dL   RDW 13.7 11.5 - 15.5 %   Platelets 416 (H) 150 - 400 K/uL   nRBC 0.0 0.0 - 0.2 %   Neutrophils Relative % 83 %   Neutro Abs 15.3 (H) 1.7 - 7.7 K/uL   Lymphocytes Relative 6 %   Lymphs Abs 1.2 0.7 - 4.0 K/uL   Monocytes Relative 9 %   Monocytes Absolute 1.7 (H) 0.1 - 1.0 K/uL   Eosinophils Relative 0 %   Eosinophils Absolute 0.0 0.0 - 0.5 K/uL   Basophils Relative 1 %   Basophils Absolute 0.2 (H) 0.0 - 0.1 K/uL   WBC Morphology DOHLE BODIES     Comment: MILD LEFT SHIFT (1-5% METAS, OCC MYELO, OCC BANDS) TOXIC GRANULATION    Immature Granulocytes 1 %   Abs Immature Granulocytes 0.18 (H) 0.00 - 0.07 K/uL    Comment: Performed at [facility], [address] [place], [place] [address]  Comprehensive metabolic panel     Status: Abnormal   Collection Time: 02/03/18  9:45 AM  Result Value Ref Range   Sodium 131 (L) 135 - 145 mmol/L   Potassium 3.7 3.5 - 5.1 mmol/L   Chloride 93 (L) 98 - 111 mmol/L   CO2 20 (L) 22 - 32 mmol/L   Glucose, Bld 111 (H) 70 - 99 mg/dL   BUN 92 (H) 8 - 23 mg/dL   Creatinine, Ser 2.74 (H) 0.44 - 1.00 mg/dL   Calcium 8.7 (L) 8.9 - 10.3 mg/dL   Total Protein 7.9 6.5 - 8.1 g/dL   Albumin 3.5 3.5 - 5.0 g/dL   AST 17 15 - 41 U/L   ALT 15 0 - 44 U/L   Alkaline Phosphatase 105 38 - 126 U/L   Total Bilirubin 0.8 0.3 -  1.2 mg/dL   GFR calc non Af Amer 14 (L) >60 mL/min   GFR calc Af Amer 16 (L) >60 mL/min   Anion gap 18 (H) 5 - 15    Comment: Performed at [facility], [address] [place], [place] [address]  Lactic acid, plasma     Status: None   Collection Time: 02/03/18  9:45 AM  Result Value Ref Range   Lactic Acid, Venous 1.5 0.5 - 1.9 mmol/L    Comment: Performed at [facility], [address] [place], [place] [address]  Blood culture (routine x 2)     Status: None (Preliminary result)   Collection Time: 02/03/18 10:02 AM  Result Value Ref Range   Specimen Description BLOOD LEFT ARM BOTTLES DRAWN AEROBIC AND ANAEROBIC    Special Requests Blood Culture adequate volume    Culture      NO GROWTH < 24 HOURS Performed at [facility], [address], [place], [place] [address]    Report Status PENDING   Blood culture (routine x 2)     Status: None (Preliminary result)   Collection Time: 02/03/18 10:17 AM  Result Value Ref Range   Specimen Description  BLOOD RIGHT HAND BOTTLES DRAWN AEROBIC ONLY    Special Requests Blood Culture adequate volume    Culture      NO GROWTH < 24 HOURS Performed at [facility], [address], [place], [place] [address]    Report Status PENDING   MRSA PCR Screening     Status: None   Collection Time: 02/03/18  3:22 PM  Result Value Ref Range   MRSA by PCR NEGATIVE NEGATIVE    Comment:        The GeneXpert MRSA Assay (FDA approved for NASAL specimens only), is one component of a comprehensive MRSA colonization surveillance program. It is not intended to diagnose MRSA infection nor to guide or monitor treatment for MRSA infections. Performed at [facility], [address] [place], [place] [address]   Urinalysis, Routine w reflex microscopic     Status: Abnormal   Collection Time: 02/03/18  6:04 PM  Result Value Ref Range   Color, Urine YELLOW YELLOW   APPearance CLOUDY (A) CLEAR   Specific Gravity, Urine 1.020 1.005 - 1.030   pH 5.0 5.0 - 8.0   Glucose, UA NEGATIVE NEGATIVE mg/dL   Hgb urine dipstick NEGATIVE NEGATIVE   Bilirubin Urine NEGATIVE NEGATIVE   Ketones, ur 5 (A) NEGATIVE mg/dL   Protein, ur 30 (A) NEGATIVE mg/dL   Nitrite NEGATIVE NEGATIVE   Leukocytes, UA NEGATIVE NEGATIVE   RBC / HPF 0-5 0 - 5 RBC/hpf   WBC, UA 0-5 0 - 5 WBC/hpf   Bacteria, UA RARE (A) NONE SEEN   Squamous Epithelial / LPF 21-50 0 - 5   Mucus PRESENT    Budding Yeast PRESENT    Non Squamous Epithelial 0-5 (A) NONE SEEN    Comment: Performed at [facility], [address] [place], [place] [address]  Culture, sputum-assessment     Status: None   Collection Time: 02/03/18  6:04 PM  Result Value Ref Range   Specimen Description EXPECTORATED SPUTUM    Special Requests NONE    Sputum evaluation      THIS SPECIMEN IS ACCEPTABLE FOR SPUTUM CULTURE Performed at [facility], [address], [place],  [address]    Report Status 02/03/2018 FINAL   Comprehensive metabolic panel     Status: Abnormal    Collection Time:  02/04/18  6:52 AM  Result Value Ref Range   Sodium 137 135 - 145 mmol/L   Potassium 3.7 3.5 - 5.1 mmol/L   Chloride 104 98 - 111 mmol/L   CO2 19 (L) 22 - 32 mmol/L   Glucose, Bld 89 70 - 99 mg/dL   BUN 87 (H) 8 - 23 mg/dL   Creatinine, Ser 1.69 (H) 0.44 - 1.00 mg/dL    Comment: DELTA CHECK NOTED   Calcium 8.3 (L) 8.9 - 10.3 mg/dL   Total Protein 6.4 (L) 6.5 - 8.1 g/dL   Albumin 2.9 (L) 3.5 - 5.0 g/dL   AST 14 (L) 15 - 41 U/L   ALT 14 0 - 44 U/L   Alkaline Phosphatase 90 38 - 126 U/L   Total Bilirubin 0.9 0.3 - 1.2 mg/dL   GFR calc non Af Amer 25 (L) >60 mL/min   GFR calc Af Amer 29 (L) >60 mL/min   Anion gap 14 5 - 15    Comment: Performed at [facility], [address] [place], [place] [address]  CBC     Status: Abnormal   Collection Time: 02/04/18  6:52 AM  Result Value Ref Range   WBC 16.3 (H) 4.0 - 10.5 K/uL   RBC 3.60 (L) 3.87 - 5.11 MIL/uL   Hemoglobin 10.7 (L) 12.0 - 15.0 g/dL   HCT 34.0 (L) 36.0 - 46.0 %   MCV 94.4 80.0 - 100.0 fL   MCH 29.7 26.0 - 34.0 pg   MCHC 31.5 30.0 - 36.0 g/dL   RDW 13.9 11.5 - 15.5 %   Platelets 396 150 - 400 K/uL   nRBC 0.0 0.0 - 0.2 %    Comment: Performed at [facility], [address], [place], [place] [address]    ABGS No results for input(s): PHART, PO2ART, TCO2, HCO3 in the last 72 hours.  Invalid input(s): PCO2 CULTURES Recent Results (from the past 240 hour(s))  Blood culture (routine x 2)     Status: None (Preliminary result)   Collection Time: 02/03/18 10:02 AM  Result Value Ref Range Status   Specimen Description BLOOD LEFT ARM BOTTLES DRAWN AEROBIC AND ANAEROBIC  Final   Special Requests Blood Culture adequate volume  Final   Culture   Final    NO GROWTH < 24 HOURS Performed at [facility], [address] [place], [place] [address]    Report Status PENDING  Incomplete  Blood culture (routine x 2)     Status: None (Preliminary result)   Collection Time: 02/03/18 10:17 AM  Result Value Ref Range  Status   Specimen Description BLOOD RIGHT HAND BOTTLES DRAWN AEROBIC ONLY  Final   Special Requests Blood Culture adequate volume  Final   Culture   Final    NO GROWTH < 24 HOURS Performed at [facility], [address], [place], [place] [address]    Report Status PENDING  Incomplete  MRSA PCR Screening     Status: None   Collection Time: 02/03/18  3:22 PM  Result Value Ref Range Status   MRSA by PCR NEGATIVE NEGATIVE Final    Comment:        The GeneXpert MRSA Assay (FDA approved for NASAL specimens only), is one component of a comprehensive MRSA colonization surveillance program. It is not intended to diagnose MRSA infection nor to guide or monitor treatment for MRSA infections. Performed at [facility], [address], [place], [place] [address]   Culture, sputum-assessment  Status: None   Collection Time: 02/03/18  6:04 PM  Result Value Ref Range Status   Specimen Description EXPECTORATED SPUTUM  Final   Special Requests NONE  Final   Sputum evaluation   Final    THIS SPECIMEN IS ACCEPTABLE FOR SPUTUM CULTURE Performed at [facility], [address], [place], [place] [address]    Report Status 02/03/2018 FINAL  Final   Studies/Results: Dg Chest 2 View  Result Date: 02/03/2018 CLINICAL DATA:  Cough EXAM: CHEST - 2 VIEW COMPARISON:  December 17, 2017 FINDINGS: There is airspace consolidation in the posterior left base with small left pleural effusion. The lungs elsewhere are clear. Heart is upper normal in size with pulmonary vascularity normal. No adenopathy. There is aortic atherosclerosis. Bones appear somewhat osteoporotic. IMPRESSION: Airspace consolidation in the posterior left base, felt to represent focal pneumonia. Small left pleural effusion. Lungs elsewhere clear. Stable cardiac silhouette. There is aortic atherosclerosis. Aortic Atherosclerosis (ICD10-I70.0). Followup PA and lateral chest radiographs recommended in 3-4 weeks following trial of antibiotic therapy  to ensure resolution and exclude underlying malignancy. Electronically Signed   By: Lowella Grip III M.D.   On: 02/03/2018 10:37    Medications:  Prior to Admission:  Medications Prior to Admission  Medication Sig Dispense Refill Last Dose  . acetaminophen (TYLENOL) 500 MG tablet Take 1,000 mg by mouth at bedtime.    02/02/2018  . amLODipine-benazepril (LOTREL) 10-20 MG capsule Take 1 capsule by mouth daily.    02/03/2018 at Unknown time  . conjugated estrogens (PREMARIN) vaginal cream Place 1 Applicatorful vaginally at bedtime.   02/02/2018  . mirabegron ER (MYRBETRIQ) 25 MG TB24 tablet Take 25 mg by mouth daily.   02/03/2018  . sertraline (ZOLOFT) 50 MG tablet Take 50 mg by mouth daily.    02/03/2018 at Unknown time  . traZODone (DESYREL) 50 MG tablet Take 50 mg by mouth at bedtime.   02/02/2018   Scheduled: . ceFEPime (MAXIPIME) IV  500 mg Intravenous Q24H  . conjugated estrogens  1 Applicatorful Vaginal QHS  . guaiFENesin  1,200 mg Oral BID  . heparin  5,000 Units Subcutaneous Q8H  . ipratropium-albuterol  3 mL Nebulization Q6H  . mirabegron ER  25 mg Oral Daily  . sertraline  50 mg Oral Daily  . traZODone  50 mg Oral QHS   Continuous: . 0.9 % NaCl with KCl 20 mEq / L 75 mL/hr at 02/03/18 1800  . [START ON 02/05/2018] vancomycin     TIW:[id number], ondansetron (ZOFRAN) IV  Assesment: She was admitted with healthcare associated pneumonia and she is on appropriate treatment.  She has acute hypoxic respiratory failure and she is now on 5 L oxygen.  She is improving but slowly  She has acute kidney injury from dehydration and her renal function is better today.  She has dementia which is stable  She is very hard of hearing and communication is difficult  She has atrial fib but I agree with Dr. Blythe Stanford assessment that she is not a good candidate for chronic anticoagulation so will not pursue this Active Problems:   Essential hypertension, benign   HCAP (healthcare-associated  pneumonia)   AKI (acute kidney injury) ([place])   Dehydration   Acute respiratory failure with hypoxia ([place])    Plan: Continue current treatments    LOS: 1 day   Alonza Bogus 02/04/2018, 9:43 AM

## 2018-02-04 NOTE — Plan of Care (Signed)
  Problem: Acute Rehab PT Goals(only PT should resolve) Goal: Pt Will Go Supine/Side To Sit Outcome: Progressing Flowsheets (Taken 02/04/2018 1220) Pt will go Supine/Side to Sit: with min guard assist; with minimal assist Goal: Patient Will Transfer Sit To/From Stand Outcome: Progressing Flowsheets (Taken 02/04/2018 1220) Patient will transfer sit to/from stand: with min guard assist Goal: Pt Will Transfer Bed To Chair/Chair To Bed Outcome: Progressing Flowsheets (Taken 02/04/2018 1220) Pt will Transfer Bed to Chair/Chair to Bed: min guard assist Goal: Pt Will Ambulate Outcome: Progressing Flowsheets (Taken 02/04/2018 1220) Pt will Ambulate: 25 feet; with minimal assist; with rolling walker   12:21 PM, 02/04/18 Lonell Grandchild, MPT Physical Therapist with [facility] [phone] office [phone] mobile phone

## 2018-02-05 LAB — CREATININE, SERUM
Creatinine, Ser: 0.84 mg/dL (ref 0.44–1.00)
GFR calc Af Amer: >60 mL/min (ref >60)
GFR calc non Af Amer: 59 mL/min — ABNORMAL LOW (ref >60)

## 2018-02-05 MED ORDER — IPRATROPIUM-ALBUTEROL 0.5-2.5 (3) MG/3ML IN SOLN
3.0000 mL | Freq: Three times a day (TID) | RESPIRATORY_TRACT | Status: DC
  Administered 2018-02-06 – 2018-02-08 (×6): 3 mL via RESPIRATORY_TRACT
  Filled 2018-02-05 (×7): qty 3

## 2018-02-05 MED ORDER — SERTRALINE HCL 50 MG PO TABS
100.0000 mg | ORAL_TABLET | Freq: Every day | ORAL | Status: DC
  Filled 2018-02-05 (×2): qty 2

## 2018-02-05 MED ORDER — LORAZEPAM 2 MG/ML IJ SOLN
INTRAMUSCULAR | Status: AC
  Administered 2018-02-05: 0.5 mg
  Filled 2018-02-05: qty 1

## 2018-02-05 MED ORDER — ALBUTEROL SULFATE (2.5 MG/3ML) 0.083% IN NEBU: 2.5000 mg | INHALATION_SOLUTION | Freq: Four times a day (QID) | RESPIRATORY_TRACT | Status: DC | PRN

## 2018-02-05 MED ORDER — ALPRAZOLAM 0.25 MG PO TABS
0.2500 mg | ORAL_TABLET | Freq: Four times a day (QID) | ORAL | Status: DC | PRN
  Administered 2018-02-05: 0.25 mg via ORAL
  Filled 2018-02-05: qty 1

## 2018-02-05 MED ORDER — LORAZEPAM 2 MG/ML IJ SOLN
0.5000 mg | Freq: Four times a day (QID) | INTRAMUSCULAR | Status: DC | PRN
  Administered 2018-02-06 – 2018-02-08 (×4): 0.5 mg via INTRAVENOUS
  Filled 2018-02-05 (×4): qty 1

## 2018-02-05 NOTE — Clinical Social Work Note (Signed)
Update provided to Butch Penny at [facility]. PT evaluation was discussed. Butch Penny advised that patient could return to the facility as she was at baseline.    Kimberlee Shoun, Clydene Pugh, LCSW

## 2018-02-05 NOTE — Progress Notes (Signed)
Subjective: She says she is weak.  She says she is ready to die.  She had PT evaluation and skilled care facility for rehab is recommended.  She does not want to eat.  She has poor appetite.  She is coughing a little bit Objective: Vital signs in last 24 hours: Temp:  [98 F (36.7 C)-98.4 F (36.9 C)] 98.2 F (36.8 C) (01/20 0702) Pulse Rate:  [101-111] 101 (01/20 0702) Resp:  [18-20] 18 (01/20 0702) BP: (108-140)/(85-91) 140/88 (01/20 0702) SpO2:  [90 %-96 %] 94 % (01/20 0714) Weight change:     Intake/Output from previous day: 01/19 0701 - 01/20 0700 In: 1305.9 [P.O.:720; I.V.:535.9; IV Piggyback:50] Out: 400 [Urine:400]  PHYSICAL EXAM General appearance: alert, cooperative and no distress Resp: rhonchi bilaterally Cardio: regular rate and rhythm, S1, S2 normal, no murmur, click, rub or gallop GI: soft, non-tender; bowel sounds normal; no masses,  no organomegaly Extremities: extremities normal, atraumatic, no cyanosis or edema  Lab Results:  Results for orders placed or performed during the hospital encounter of 02/03/18 (from the past 48 hour(s))  CBC with Differential/Platelet     Status: Abnormal   Collection Time: 02/03/18  9:45 AM  Result Value Ref Range   WBC 18.5 (H) 4.0 - 10.5 K/uL   RBC 4.00 3.87 - 5.11 MIL/uL   Hemoglobin 11.7 (L) 12.0 - 15.0 g/dL   HCT 36.5 36.0 - 46.0 %   MCV 91.3 80.0 - 100.0 fL   MCH 29.3 26.0 - 34.0 pg   MCHC 32.1 30.0 - 36.0 g/dL   RDW 13.7 11.5 - 15.5 %   Platelets 416 (H) 150 - 400 K/uL   nRBC 0.0 0.0 - 0.2 %   Neutrophils Relative % 83 %   Neutro Abs 15.3 (H) 1.7 - 7.7 K/uL   Lymphocytes Relative 6 %   Lymphs Abs 1.2 0.7 - 4.0 K/uL   Monocytes Relative 9 %   Monocytes Absolute 1.7 (H) 0.1 - 1.0 K/uL   Eosinophils Relative 0 %   Eosinophils Absolute 0.0 0.0 - 0.5 K/uL   Basophils Relative 1 %   Basophils Absolute 0.2 (H) 0.0 - 0.1 K/uL   WBC Morphology DOHLE BODIES     Comment: MILD LEFT SHIFT (1-5% METAS, OCC MYELO, OCC  BANDS) TOXIC GRANULATION    Immature Granulocytes 1 %   Abs Immature Granulocytes 0.18 (H) 0.00 - 0.07 K/uL    Comment: Performed at [facility], [address] [place], [place] [address]  Comprehensive metabolic panel     Status: Abnormal   Collection Time: 02/03/18  9:45 AM  Result Value Ref Range   Sodium 131 (L) 135 - 145 mmol/L   Potassium 3.7 3.5 - 5.1 mmol/L   Chloride 93 (L) 98 - 111 mmol/L   CO2 20 (L) 22 - 32 mmol/L   Glucose, Bld 111 (H) 70 - 99 mg/dL   BUN 92 (H) 8 - 23 mg/dL   Creatinine, Ser 2.74 (H) 0.44 - 1.00 mg/dL   Calcium 8.7 (L) 8.9 - 10.3 mg/dL   Total Protein 7.9 6.5 - 8.1 g/dL   Albumin 3.5 3.5 - 5.0 g/dL   AST 17 15 - 41 U/L   ALT 15 0 - 44 U/L   Alkaline Phosphatase 105 38 - 126 U/L   Total Bilirubin 0.8 0.3 - 1.2 mg/dL   GFR calc non Af Amer 14 (L) >60 mL/min   GFR calc Af Amer 16 (L) >60 mL/min   Anion gap 18 (  H) 5 - 15    Comment: Performed at [facility], [address], [place], [place] [address]  Lactic acid, plasma     Status: None   Collection Time: 02/03/18  9:45 AM  Result Value Ref Range   Lactic Acid, Venous 1.5 0.5 - 1.9 mmol/L    Comment: Performed at [facility], [address], [place], [place] [address]  Blood culture (routine x 2)     Status: None (Preliminary result)   Collection Time: 02/03/18 10:02 AM  Result Value Ref Range   Specimen Description BLOOD LEFT ARM BOTTLES DRAWN AEROBIC AND ANAEROBIC    Special Requests Blood Culture adequate volume    Culture      NO GROWTH 2 DAYS Performed at [facility], [address] [place], [place] [address]    Report Status PENDING   Blood culture (routine x 2)     Status: None (Preliminary result)   Collection Time: 02/03/18 10:17 AM  Result Value Ref Range   Specimen Description BLOOD RIGHT HAND BOTTLES DRAWN AEROBIC ONLY    Special Requests Blood Culture adequate volume    Culture      NO GROWTH 2 DAYS Performed at [facility], [address], [place], [place] [address]     Report Status PENDING   MRSA PCR Screening     Status: None   Collection Time: 02/03/18  3:22 PM  Result Value Ref Range   MRSA by PCR NEGATIVE NEGATIVE    Comment:        The GeneXpert MRSA Assay (FDA approved for NASAL specimens only), is one component of a comprehensive MRSA colonization surveillance program. It is not intended to diagnose MRSA infection nor to guide or monitor treatment for MRSA infections. Performed at [facility], [address] [place], [place] [address]   Urinalysis, Routine w reflex microscopic     Status: Abnormal   Collection Time: 02/03/18  6:04 PM  Result Value Ref Range   Color, Urine YELLOW YELLOW   APPearance CLOUDY (A) CLEAR   Specific Gravity, Urine 1.020 1.005 - 1.030   pH 5.0 5.0 - 8.0   Glucose, UA NEGATIVE NEGATIVE mg/dL   Hgb urine dipstick NEGATIVE NEGATIVE   Bilirubin Urine NEGATIVE NEGATIVE   Ketones, ur 5 (A) NEGATIVE mg/dL   Protein, ur 30 (A) NEGATIVE mg/dL   Nitrite NEGATIVE NEGATIVE   Leukocytes, UA NEGATIVE NEGATIVE   RBC / HPF 0-5 0 - 5 RBC/hpf   WBC, UA 0-5 0 - 5 WBC/hpf   Bacteria, UA RARE (A) NONE SEEN   Squamous Epithelial / LPF 21-50 0 - 5   Mucus PRESENT    Budding Yeast PRESENT    Non Squamous Epithelial 0-5 (A) NONE SEEN    Comment: Performed at [facility], [address] [place], [place] [address]  Culture, sputum-assessment     Status: None   Collection Time: 02/03/18  6:04 PM  Result Value Ref Range   Specimen Description EXPECTORATED SPUTUM    Special Requests NONE    Sputum evaluation      THIS SPECIMEN IS ACCEPTABLE FOR SPUTUM CULTURE Performed at [facility], [address] [place], [place] [address]    Report Status 02/03/2018 FINAL   Strep pneumoniae urinary antigen     Status: None   Collection Time: 02/03/18  6:04 PM  Result Value Ref Range   Strep Pneumo Urinary Antigen NEGATIVE NEGATIVE    Comment:        Infection due  to S. pneumoniae cannot be absolutely ruled out since the antigen  present may be below the detection limit of the test. Performed at [facility], [address] [place], [place] [address]   Culture, respiratory     Status: None (Preliminary result)   Collection Time: 02/03/18  6:04 PM  Result Value Ref Range   Specimen Description      EXPECTORATED SPUTUM Performed at [facility], [address], [place], [place] [address]    Special Requests      NONE Reflexed from [id number] Performed at [facility], [address], [place], [place] [address]    Gram Stain      ABUNDANT WBC PRESENT, PREDOMINANTLY PMN FEW GRAM POSITIVE COCCI IN PAIRS IN CLUSTERS RARE GRAM POSITIVE RODS Performed at [facility], [place] [address] [place], [place] [address]    Culture PENDING    Report Status PENDING   Comprehensive metabolic panel     Status: Abnormal   Collection Time: 02/04/18  6:52 AM  Result Value Ref Range   Sodium 137 135 - 145 mmol/L   Potassium 3.7 3.5 - 5.1 mmol/L   Chloride 104 98 - 111 mmol/L   CO2 19 (L) 22 - 32 mmol/L   Glucose, Bld 89 70 - 99 mg/dL   BUN 87 (H) 8 - 23 mg/dL   Creatinine, Ser 1.69 (H) 0.44 - 1.00 mg/dL    Comment: DELTA CHECK NOTED   Calcium 8.3 (L) 8.9 - 10.3 mg/dL   Total Protein 6.4 (L) 6.5 - 8.1 g/dL   Albumin 2.9 (L) 3.5 - 5.0 g/dL   AST 14 (L) 15 - 41 U/L   ALT 14 0 - 44 U/L   Alkaline Phosphatase 90 38 - 126 U/L   Total Bilirubin 0.9 0.3 - 1.2 mg/dL   GFR calc non Af Amer 25 (L) >60 mL/min   GFR calc Af Amer 29 (L) >60 mL/min   Anion gap 14 5 - 15    Comment: Performed at [facility], [address], [place], [place] [address]  CBC     Status: Abnormal   Collection Time: 02/04/18  6:52 AM  Result Value Ref Range   WBC 16.3 (H) 4.0 - 10.5 K/uL   RBC 3.60 (L) 3.87 - 5.11 MIL/uL   Hemoglobin 10.7 (L) 12.0 - 15.0 g/dL   HCT 34.0 (L) 36.0 - 46.0 %   MCV 94.4 80.0 - 100.0 fL   MCH 29.7 26.0 - 34.0 pg   MCHC 31.5 30.0 - 36.0 g/dL   RDW 13.9 11.5 - 15.5 %   Platelets 396 150 - 400 K/uL   nRBC 0.0 0.0 -  0.2 %    Comment: Performed at [facility], [address] [place], [place] [address]  Creatinine, serum     Status: Abnormal   Collection Time: 02/05/18  7:04 AM  Result Value Ref Range   Creatinine, Ser 0.84 0.44 - 1.00 mg/dL   GFR calc non Af Amer 59 (L) >60 mL/min   GFR calc Af Amer >60 >60 mL/min    Comment: Performed at [facility], [address], [place], [place] [address]    ABGS No results for input(s): PHART, PO2ART, TCO2, HCO3 in the last 72 hours.  Invalid input(s): PCO2 CULTURES Recent Results (from the past 240 hour(s))  Blood culture (routine x 2)     Status: None (Preliminary result)   Collection Time: 02/03/18 10:02 AM  Result Value Ref Range Status  Specimen Description BLOOD LEFT ARM BOTTLES DRAWN AEROBIC AND ANAEROBIC  Final   Special Requests Blood Culture adequate volume  Final   Culture   Final    NO GROWTH 2 DAYS Performed at [facility], [address], [place], [place] [address]    Report Status PENDING  Incomplete  Blood culture (routine x 2)     Status: None (Preliminary result)   Collection Time: 02/03/18 10:17 AM  Result Value Ref Range Status   Specimen Description BLOOD RIGHT HAND BOTTLES DRAWN AEROBIC ONLY  Final   Special Requests Blood Culture adequate volume  Final   Culture   Final    NO GROWTH 2 DAYS Performed at [facility], [address] [place], [place] [address]    Report Status PENDING  Incomplete  MRSA PCR Screening     Status: None   Collection Time: 02/03/18  3:22 PM  Result Value Ref Range Status   MRSA by PCR NEGATIVE NEGATIVE Final    Comment:        The GeneXpert MRSA Assay (FDA approved for NASAL specimens only), is one component of a comprehensive MRSA colonization surveillance program. It is not intended to diagnose MRSA infection nor to guide or monitor treatment for MRSA infections. Performed at [facility], [address], [place], [place] [address]   Culture, sputum-assessment     Status: None    Collection Time: 02/03/18  6:04 PM  Result Value Ref Range Status   Specimen Description EXPECTORATED SPUTUM  Final   Special Requests NONE  Final   Sputum evaluation   Final    THIS SPECIMEN IS ACCEPTABLE FOR SPUTUM CULTURE Performed at [facility], [address] [place], [place] [address]    Report Status 02/03/2018 FINAL  Final  Culture, respiratory     Status: None (Preliminary result)   Collection Time: 02/03/18  6:04 PM  Result Value Ref Range Status   Specimen Description   Final    EXPECTORATED SPUTUM Performed at [facility], [address], [place], [place] [address]    Special Requests   Final    NONE Reflexed from [id number] Performed at [facility], [address] [place], [place] [address]    Gram Stain   Final    ABUNDANT WBC PRESENT, PREDOMINANTLY PMN FEW GRAM POSITIVE COCCI IN PAIRS IN CLUSTERS RARE GRAM POSITIVE RODS Performed at [facility], [place] [address], [place], [place] [address]    Culture PENDING  Incomplete   Report Status PENDING  Incomplete   Studies/Results: Dg Chest 2 View  Result Date: 02/03/2018 CLINICAL DATA:  Cough EXAM: CHEST - 2 VIEW COMPARISON:  December 17, 2017 FINDINGS: There is airspace consolidation in the posterior left base with small left pleural effusion. The lungs elsewhere are clear. Heart is upper normal in size with pulmonary vascularity normal. No adenopathy. There is aortic atherosclerosis. Bones appear somewhat osteoporotic. IMPRESSION: Airspace consolidation in the posterior left base, felt to represent focal pneumonia. Small left pleural effusion. Lungs elsewhere clear. Stable cardiac silhouette. There is aortic atherosclerosis. Aortic Atherosclerosis (ICD10-I70.0). Followup PA and lateral chest radiographs recommended in 3-4 weeks following trial of antibiotic therapy to ensure resolution and exclude underlying malignancy. Electronically Signed   By: Lowella Grip III M.D.   On: 02/03/2018 10:37    Medications:   Prior to Admission:  Medications Prior to Admission  Medication Sig Dispense Refill Last Dose  . acetaminophen (TYLENOL) 500 MG tablet Take 1,000 mg by mouth at bedtime.  02/02/2018  . amLODipine-benazepril (LOTREL) 10-20 MG capsule Take 1 capsule by mouth daily.    02/03/2018 at Unknown time  . conjugated estrogens (PREMARIN) vaginal cream Place 1 Applicatorful vaginally at bedtime.   02/02/2018  . mirabegron ER (MYRBETRIQ) 25 MG TB24 tablet Take 25 mg by mouth daily.   02/03/2018  . sertraline (ZOLOFT) 50 MG tablet Take 50 mg by mouth daily.    02/03/2018 at Unknown time  . traZODone (DESYREL) 50 MG tablet Take 50 mg by mouth at bedtime.   02/02/2018   Scheduled: . conjugated estrogens  1 Applicatorful Vaginal QHS  . guaiFENesin  1,200 mg Oral BID  . heparin  5,000 Units Subcutaneous Q8H  . ipratropium-albuterol  3 mL Nebulization Q6H  . mirabegron ER  25 mg Oral Daily  . sertraline  50 mg Oral Daily  . traZODone  50 mg Oral QHS   Continuous: . 0.9 % NaCl with KCl 20 mEq / L 75 mL/hr at 02/04/18 1527  . ceFEPime (MAXIPIME) IV    . vancomycin     TRZ:NBVAPOLIDCVUD, ondansetron (ZOFRAN) IV  Assesment: Healthcare associated pneumonia.  She is still short of breath with minimal exertion even talking.  She had acute kidney injury likely from dehydration which is improved.  She has acute hypoxic respiratory failure still requiring 5 L of oxygen.  She has hypertension which is well controlled.    She has depression and she is on sertraline but I am going to increase the dose. Active Problems:   Essential hypertension, benign   HCAP (healthcare-associated pneumonia)   AKI (acute kidney injury) ([place])   Dehydration   Acute respiratory failure with hypoxia ([place])    Plan: Continue treatments.  Not ready for transfer to skilled care facility quite yet.  Increase sertraline.  Continue other meds.    LOS: 2 days   [name] 02/05/2018, 8:51 AM

## 2018-02-06 ENCOUNTER — Inpatient Hospital Stay (HOSPITAL_COMMUNITY): Payer: PPO

## 2018-02-06 LAB — BASIC METABOLIC PANEL
Anion gap: 11 (ref 5–15)
BUN: 28 mg/dL — ABNORMAL HIGH (ref 8–23)
CALCIUM: 8.7 mg/dL — AB (ref 8.9–10.3)
CO2: 19 mmol/L — ABNORMAL LOW (ref 22–32)
Chloride: 114 mmol/L — ABNORMAL HIGH (ref 98–111)
Creatinine, Ser: 0.72 mg/dL (ref 0.44–1.00)
GFR calc non Af Amer: >60 mL/min (ref >60)
Glucose, Bld: 151 mg/dL — ABNORMAL HIGH (ref 70–99)
Potassium: 5 mmol/L (ref 3.5–5.1)
Sodium: 144 mmol/L (ref 135–145)

## 2018-02-06 LAB — CBC WITH DIFFERENTIAL/PLATELET
Abs Immature Granulocytes: 4.53 10*3/uL — ABNORMAL HIGH (ref 0.00–0.07)
Basophils Absolute: 0 10*3/uL (ref 0.0–0.1)
Basophils Relative: 0 %
Eosinophils Absolute: 0 10*3/uL (ref 0.0–0.5)
Eosinophils Relative: 0 %
HEMATOCRIT: 39 % (ref 36.0–46.0)
Hemoglobin: 11.8 g/dL — ABNORMAL LOW (ref 12.0–15.0)
IMMATURE GRANULOCYTES: 15 %
Lymphocytes Relative: 3 %
Lymphs Abs: 1 10*3/uL (ref 0.7–4.0)
MCH: 29.1 pg (ref 26.0–34.0)
MCHC: 30.3 g/dL (ref 30.0–36.0)
MCV: 96.3 fL (ref 80.0–100.0)
Monocytes Absolute: 1.8 10*3/uL — ABNORMAL HIGH (ref 0.1–1.0)
Monocytes Relative: 6 %
Neutro Abs: 24 10*3/uL — ABNORMAL HIGH (ref 1.7–7.7)
Neutrophils Relative %: 76 %
Platelets: 532 10*3/uL — ABNORMAL HIGH (ref 150–400)
RBC: 4.05 MIL/uL (ref 3.87–5.11)
RDW: 14.6 % (ref 11.5–15.5)
WBC Morphology: INCREASED
WBC: 31.3 10*3/uL — AB (ref 4.0–10.5)
nRBC: 0 % (ref 0.0–0.2)

## 2018-02-06 LAB — LEGIONELLA PNEUMOPHILA SEROGP 1 UR AG: L. pneumophila Serogp 1 Ur Ag: NEGATIVE

## 2018-02-06 LAB — CULTURE, RESPIRATORY W GRAM STAIN: Culture: NORMAL

## 2018-02-06 MED ORDER — SODIUM CHLORIDE 0.9 % IV SOLN
INTRAVENOUS | Status: DC
  Administered 2018-02-06: 09:00:00 via INTRAVENOUS

## 2018-02-06 MED ORDER — FUROSEMIDE 10 MG/ML IJ SOLN
40.0000 mg | Freq: Two times a day (BID) | INTRAMUSCULAR | Status: DC
  Administered 2018-02-06 – 2018-02-07 (×4): 40 mg via INTRAVENOUS
  Filled 2018-02-06 (×4): qty 4

## 2018-02-06 MED ORDER — METOPROLOL TARTRATE 5 MG/5ML IV SOLN
5.0000 mg | Freq: Four times a day (QID) | INTRAVENOUS | Status: DC
  Administered 2018-02-06 – 2018-02-07 (×5): 5 mg via INTRAVENOUS
  Filled 2018-02-06 (×5): qty 5

## 2018-02-06 MED ORDER — PIPERACILLIN-TAZOBACTAM 3.375 G IVPB
3.3750 g | Freq: Three times a day (TID) | INTRAVENOUS | Status: DC
  Administered 2018-02-06 – 2018-02-08 (×6): 3.375 g via INTRAVENOUS
  Filled 2018-02-06 (×5): qty 50

## 2018-02-06 NOTE — Progress Notes (Signed)
Pharmacy Antibiotic Note  [name] is a 85 y.o. female admitted on 02/03/2018 with worsening pneumonia/ aspiration and acute kidney injury secondary to dehydration.  Pharmacy has been consulted for Vancomycin dosing.  Plan: Zosyn 3.375g IV every 8 hours Monitor labs, c/s, and patient improvement.  Height: 5\' 2"  (157.5 cm) Weight: 99 lb 13.9 oz (45.3 kg) IBW/kg (Calculated) : 50.1  Temp (24hrs), Avg:98 F (36.7 C), Min:97.7 F (36.5 C), Max:98.3 F (36.8 C)  Recent Labs  Lab 02/03/18 0945 02/04/18 0652 02/05/18 0704 02/06/18 0658  WBC 18.5* 16.3*  --  31.3*  CREATININE 2.74* 1.69* 0.84 0.72  LATICACIDVEN 1.5  --   --   --     Estimated Creatinine Clearance: 30.1 mL/min (by C-G formula based on SCr of 0.72 mg/dL).    Allergies  Allergen Reactions  . Sulfa Antibiotics     Antimicrobials this admission: Vanco 1/18 >> 1//20 Cefepime 1/18 >> 1/21 Zosyn 1/21 >>  Dose adjustments this admission: Vanco/Cefepime  Microbiology results: 1/18 BCx: ngtd  1/18 Sputum: few gram positive cocci/rare gram positive rods  1/18 MRSA PCR: negative  Thank you for allowing pharmacy to be a part of this patient's care.  Ramond Craver 02/06/2018 11:41 AM

## 2018-02-06 NOTE — Progress Notes (Signed)
After I saw her this morning I received a call from her nurse that she seemed worse. She's requiring more oxygen. Her breathing is more labored. She has nebulizer treatments ordered. I'm going to have her get a chest x-ray to see if her pneumonia is worse or if perhaps she's developed some heart failure because of her atrial fib.

## 2018-02-06 NOTE — Progress Notes (Signed)
CXR consistent with worsening pneumonia but also CHF. Will D/C cefipime ,switch to zosyn for presumed aspiration. Add lasix

## 2018-02-06 NOTE — Progress Notes (Signed)
Subjective: She has had more trouble with tachycardia.  This initially occurred when she was agitated so she was given medications for agitation but it has not changed her tachycardia.  It looks like she is in A. fib with rapid ventricular response and as mentioned she did not have a good response to Ativan.  Objective: Vital signs in last 24 hours: Temp:  [97.7 F (36.5 C)-98.3 F (36.8 C)] 97.7 F (36.5 C) (01/21 0529) Pulse Rate:  [98-119] 119 (01/21 0529) Resp:  [18-20] 20 (01/20 2238) BP: (123-150)/(79-88) 123/86 (01/21 0529) SpO2:  [90 %-98 %] 90 % (01/21 0529) Weight change:     Intake/Output from previous day: 01/20 0701 - 01/21 0700 In: 300 [P.O.:300] Out: 350 [Urine:350]  PHYSICAL EXAM General appearance: Sleepy but arousable.  Very hard of hearing Resp: rhonchi bilaterally Cardio: irregularly irregular rhythm GI: soft, non-tender; bowel sounds normal; no masses,  no organomegaly Extremities: extremities normal, atraumatic, no cyanosis or edema  Lab Results:  Results for orders placed or performed during the hospital encounter of 02/03/18 (from the past 48 hour(s))  Creatinine, serum     Status: Abnormal   Collection Time: 02/05/18  7:04 AM  Result Value Ref Range   Creatinine, Ser 0.84 0.44 - 1.00 mg/dL   GFR calc non Af Amer 59 (L) >60 mL/min   GFR calc Af Amer >60 >60 mL/min    Comment: Performed at [facility], [address] [place], [place] [address]  CBC with Differential/Platelet     Status: Abnormal   Collection Time: 02/06/18  6:58 AM  Result Value Ref Range   WBC 31.3 (H) 4.0 - 10.5 K/uL   RBC 4.05 3.87 - 5.11 MIL/uL   Hemoglobin 11.8 (L) 12.0 - 15.0 g/dL   HCT 39.0 36.0 - 46.0 %   MCV 96.3 80.0 - 100.0 fL   MCH 29.1 26.0 - 34.0 pg   MCHC 30.3 30.0 - 36.0 g/dL   RDW 14.6 11.5 - 15.5 %   Platelets 532 (H) 150 - 400 K/uL   nRBC 0.0 0.0 - 0.2 %   Neutrophils Relative % 76 %   Neutro Abs 24.0 (H) 1.7 - 7.7 K/uL   Lymphocytes Relative 3 %   Lymphs Abs 1.0 0.7 - 4.0 K/uL   Monocytes Relative 6 %   Monocytes Absolute 1.8 (H) 0.1 - 1.0 K/uL   Eosinophils Relative 0 %   Eosinophils Absolute 0.0 0.0 - 0.5 K/uL   Basophils Relative 0 %   Basophils Absolute 0.0 0.0 - 0.1 K/uL   WBC Morphology INCREASED BANDS (>20% BANDS)     Comment: MILD LEFT SHIFT (1-5% METAS, OCC MYELO, OCC BANDS)   Immature Granulocytes 15 %   Abs Immature Granulocytes 4.53 (H) 0.00 - 0.07 K/uL    Comment: Performed at [facility], [address] [place], [place] [address]  Basic metabolic panel     Status: Abnormal   Collection Time: 02/06/18  6:58 AM  Result Value Ref Range   Sodium 144 135 - 145 mmol/L   Potassium 5.0 3.5 - 5.1 mmol/L   Chloride 114 (H) 98 - 111 mmol/L   CO2 19 (L) 22 - 32 mmol/L   Glucose, Bld 151 (H) 70 - 99 mg/dL   BUN 28 (H) 8 - 23 mg/dL   Creatinine, Ser 0.72 0.44 - 1.00 mg/dL   Calcium 8.7 (L) 8.9 - 10.3 mg/dL   GFR calc non Af Amer >60 >60 mL/min   GFR calc Af Amer >  60 >60 mL/min   Anion gap 11 5 - 15    Comment: Performed at [facility], [address] [place], [place] [address]    ABGS No results for input(s): PHART, PO2ART, TCO2, HCO3 in the last 72 hours.  Invalid input(s): PCO2 CULTURES Recent Results (from the past 240 hour(s))  Blood culture (routine x 2)     Status: None (Preliminary result)   Collection Time: 02/03/18 10:02 AM  Result Value Ref Range Status   Specimen Description BLOOD LEFT ARM BOTTLES DRAWN AEROBIC AND ANAEROBIC  Final   Special Requests Blood Culture adequate volume  Final   Culture   Final    NO GROWTH 3 DAYS Performed at [facility], [address] [place], [place] [address]    Report Status PENDING  Incomplete  Blood culture (routine x 2)     Status: None (Preliminary result)   Collection Time: 02/03/18 10:17 AM  Result Value Ref Range Status   Specimen Description BLOOD RIGHT HAND BOTTLES DRAWN AEROBIC ONLY  Final   Special Requests Blood Culture adequate volume  Final   Culture    Final    NO GROWTH 3 DAYS Performed at [facility], [address], [place], [place] [address]    Report Status PENDING  Incomplete  MRSA PCR Screening     Status: None   Collection Time: 02/03/18  3:22 PM  Result Value Ref Range Status   MRSA by PCR NEGATIVE NEGATIVE Final    Comment:        The GeneXpert MRSA Assay (FDA approved for NASAL specimens only), is one component of a comprehensive MRSA colonization surveillance program. It is not intended to diagnose MRSA infection nor to guide or monitor treatment for MRSA infections. Performed at [facility], [address], [place], [place] [address]   Culture, sputum-assessment     Status: None   Collection Time: 02/03/18  6:04 PM  Result Value Ref Range Status   Specimen Description EXPECTORATED SPUTUM  Final   Special Requests NONE  Final   Sputum evaluation   Final    THIS SPECIMEN IS ACCEPTABLE FOR SPUTUM CULTURE Performed at [facility], [address] [place], [place] [address]    Report Status 02/03/2018 FINAL  Final  Culture, respiratory     Status: None (Preliminary result)   Collection Time: 02/03/18  6:04 PM  Result Value Ref Range Status   Specimen Description   Final    EXPECTORATED SPUTUM Performed at [facility], [address], [place], [place] [address]    Special Requests   Final    NONE Reflexed from [id number] Performed at [facility], [address] [place], [place] [address]    Gram Stain   Final    ABUNDANT WBC PRESENT, PREDOMINANTLY PMN FEW GRAM POSITIVE COCCI IN PAIRS IN CLUSTERS RARE GRAM POSITIVE RODS Performed at [facility], [place] [address], [place],  [address]    Culture FEW Consistent with normal respiratory flora.  Final   Report Status PENDING  Incomplete   Studies/Results: No results found.  Medications:  Prior to Admission:  Medications Prior to Admission  Medication Sig Dispense Refill Last Dose  . acetaminophen (TYLENOL) 500 MG tablet Take 1,000 mg by mouth at  bedtime.    02/02/2018  . amLODipine-benazepril (LOTREL) 10-20 MG capsule Take 1 capsule by mouth daily.    02/03/2018 at Unknown time  . conjugated estrogens (PREMARIN) vaginal cream Place 1 Applicatorful vaginally at bedtime.  02/02/2018  . mirabegron ER (MYRBETRIQ) 25 MG TB24 tablet Take 25 mg by mouth daily.   02/03/2018  . sertraline (ZOLOFT) 50 MG tablet Take 50 mg by mouth daily.    02/03/2018 at Unknown time  . traZODone (DESYREL) 50 MG tablet Take 50 mg by mouth at bedtime.   02/02/2018   Scheduled: . conjugated estrogens  1 Applicatorful Vaginal QHS  . guaiFENesin  1,200 mg Oral BID  . heparin  5,000 Units Subcutaneous Q8H  . ipratropium-albuterol  3 mL Nebulization TID  . mirabegron ER  25 mg Oral Daily  . sertraline  100 mg Oral Daily  . traZODone  50 mg Oral QHS   Continuous: . 0.9 % NaCl with KCl 20 mEq / L 75 mL/hr at 02/05/18 2242  . ceFEPime (MAXIPIME) IV 1 g (02/05/18 1038)   KPQ:[id number], albuterol, ALPRAZolam, LORazepam, ondansetron (ZOFRAN) IV  Assesment: She was admitted with healthcare associated pneumonia and she was dehydrated.  Her hydration is better.  She had acute kidney injury which appears better.  She is still congested.  Her white blood count is gone up to 31,000  She has atrial fib with RVR and that is going to need to be treated. Active Problems:   Essential hypertension, benign   HCAP (healthcare-associated pneumonia)   AKI (acute kidney injury) ([place])   Dehydration   Acute respiratory failure with hypoxia ([facility])    Plan: Add Lopressor.  Continue other treatments.  Recheck labs in the morning.  Continue meds for agitation but I do not think that is the source of her tachycardia    LOS: 3 days   [name] 02/06/2018, 8:33 AM

## 2018-02-07 LAB — CBC WITH DIFFERENTIAL/PLATELET
Abs Immature Granulocytes: 3.27 10*3/uL — ABNORMAL HIGH (ref 0.00–0.07)
Basophils Absolute: 0 10*3/uL (ref 0.0–0.1)
Basophils Relative: 0 %
EOS ABS: 0 10*3/uL (ref 0.0–0.5)
Eosinophils Relative: 0 %
HCT: 38.9 % (ref 36.0–46.0)
Hemoglobin: 12 g/dL (ref 12.0–15.0)
Immature Granulocytes: 14 %
LYMPHS PCT: 5 %
Lymphs Abs: 1.1 10*3/uL (ref 0.7–4.0)
MCH: 28.9 pg (ref 26.0–34.0)
MCHC: 30.8 g/dL (ref 30.0–36.0)
MCV: 93.7 fL (ref 80.0–100.0)
Monocytes Absolute: 1.1 10*3/uL — ABNORMAL HIGH (ref 0.1–1.0)
Monocytes Relative: 5 %
Neutro Abs: 18.5 10*3/uL — ABNORMAL HIGH (ref 1.7–7.7)
Neutrophils Relative %: 76 %
Platelets: 526 10*3/uL — ABNORMAL HIGH (ref 150–400)
RBC: 4.15 MIL/uL (ref 3.87–5.11)
RDW: 14.6 % (ref 11.5–15.5)
WBC Morphology: INCREASED
WBC: 24.1 10*3/uL — ABNORMAL HIGH (ref 4.0–10.5)
nRBC: 0 % (ref 0.0–0.2)

## 2018-02-07 LAB — BASIC METABOLIC PANEL
ANION GAP: 13 (ref 5–15)
BUN: 27 mg/dL — ABNORMAL HIGH (ref 8–23)
CO2: 27 mmol/L (ref 22–32)
Calcium: 8.9 mg/dL (ref 8.9–10.3)
Chloride: 106 mmol/L (ref 98–111)
Creatinine, Ser: 0.8 mg/dL (ref 0.44–1.00)
GFR calc Af Amer: >60 mL/min (ref >60)
GFR calc non Af Amer: >60 mL/min (ref >60)
GLUCOSE: 133 mg/dL — AB (ref 70–99)
Potassium: 3.7 mmol/L (ref 3.5–5.1)
Sodium: 146 mmol/L — ABNORMAL HIGH (ref 135–145)

## 2018-02-07 MED ORDER — METOPROLOL TARTRATE 5 MG/5ML IV SOLN
10.0000 mg | Freq: Four times a day (QID) | INTRAVENOUS | Status: DC
  Administered 2018-02-07 – 2018-02-08 (×4): 10 mg via INTRAVENOUS
  Filled 2018-02-07 (×4): qty 10

## 2018-02-07 MED ORDER — MORPHINE SULFATE (PF) 2 MG/ML IV SOLN
2.0000 mg | INTRAVENOUS | Status: DC | PRN
  Administered 2018-02-07 – 2018-02-08 (×5): 2 mg via INTRAVENOUS
  Filled 2018-02-07 (×5): qty 1

## 2018-02-07 NOTE — Progress Notes (Addendum)
Subjective: She looks worse.  She is agitated again.  Heart rate still running greater than 100.  No specific complaints but it is hard to tell because she is so agitated  Objective: Vital signs in last 24 hours: Temp:  [97.7 F (36.5 C)-98.5 F (36.9 C)] 97.7 F (36.5 C) (01/22 0801) Pulse Rate:  [120-131] 126 (01/22 0801) Resp:  [18-20] 18 (01/22 4315) BP: (144-159)/(76-92) 159/90 (01/22 0801) SpO2:  [82 %-96 %] 94 % (01/22 0801) Weight change:  Last BM Date: (Unable to communicate)  Intake/Output from previous day: 01/21 0701 - 01/22 0700 In: 98.4 [IV Piggyback:98.4] Out: 600 [Urine:600]  PHYSICAL EXAM General appearance: Agitated, uncomfortable Resp: rhonchi bilaterally Cardio: irregularly irregular rhythm GI: soft, non-tender; bowel sounds normal; no masses,  no organomegaly Extremities: extremities normal, atraumatic, no cyanosis or edema  Lab Results:  Results for orders placed or performed during the hospital encounter of 02/03/18 (from the past 48 hour(s))  CBC with Differential/Platelet     Status: Abnormal   Collection Time: 02/06/18  6:58 AM  Result Value Ref Range   WBC 31.3 (H) 4.0 - 10.5 K/uL   RBC 4.05 3.87 - 5.11 MIL/uL   Hemoglobin 11.8 (L) 12.0 - 15.0 g/dL   HCT 39.0 36.0 - 46.0 %   MCV 96.3 80.0 - 100.0 fL   MCH 29.1 26.0 - 34.0 pg   MCHC 30.3 30.0 - 36.0 g/dL   RDW 14.6 11.5 - 15.5 %   Platelets 532 (H) 150 - 400 K/uL   nRBC 0.0 0.0 - 0.2 %   Neutrophils Relative % 76 %   Neutro Abs 24.0 (H) 1.7 - 7.7 K/uL   Lymphocytes Relative 3 %   Lymphs Abs 1.0 0.7 - 4.0 K/uL   Monocytes Relative 6 %   Monocytes Absolute 1.8 (H) 0.1 - 1.0 K/uL   Eosinophils Relative 0 %   Eosinophils Absolute 0.0 0.0 - 0.5 K/uL   Basophils Relative 0 %   Basophils Absolute 0.0 0.0 - 0.1 K/uL   WBC Morphology INCREASED BANDS (>20% BANDS)     Comment: MILD LEFT SHIFT (1-5% METAS, OCC MYELO, OCC BANDS)   Immature Granulocytes 15 %   Abs Immature Granulocytes 4.53 (H)  0.00 - 0.07 K/uL    Comment: Performed at [facility], [address], [place], [place] [address]  Basic metabolic panel     Status: Abnormal   Collection Time: 02/06/18  6:58 AM  Result Value Ref Range   Sodium 144 135 - 145 mmol/L   Potassium 5.0 3.5 - 5.1 mmol/L   Chloride 114 (H) 98 - 111 mmol/L   CO2 19 (L) 22 - 32 mmol/L   Glucose, Bld 151 (H) 70 - 99 mg/dL   BUN 28 (H) 8 - 23 mg/dL   Creatinine, Ser 0.72 0.44 - 1.00 mg/dL   Calcium 8.7 (L) 8.9 - 10.3 mg/dL   GFR calc non Af Amer >60 >60 mL/min   GFR calc Af Amer >60 >60 mL/min   Anion gap 11 5 - 15    Comment: Performed at [facility], [address], [place], [place] [address]  Basic metabolic panel     Status: Abnormal   Collection Time: 02/07/18  5:40 AM  Result Value Ref Range   Sodium 146 (H) 135 - 145 mmol/L   Potassium 3.7 3.5 - 5.1 mmol/L    Comment: DELTA CHECK NOTED   Chloride 106 98 - 111 mmol/L   CO2 27 22 - 32 mmol/L  Glucose, Bld 133 (H) 70 - 99 mg/dL   BUN 27 (H) 8 - 23 mg/dL   Creatinine, Ser 0.80 0.44 - 1.00 mg/dL   Calcium 8.9 8.9 - 10.3 mg/dL   GFR calc non Af Amer >60 >60 mL/min   GFR calc Af Amer >60 >60 mL/min   Anion gap 13 5 - 15    Comment: Performed at [facility], [address] [place], [place] [address]  CBC with Differential/Platelet     Status: Abnormal   Collection Time: 02/07/18  5:40 AM  Result Value Ref Range   WBC 24.1 (H) 4.0 - 10.5 K/uL   RBC 4.15 3.87 - 5.11 MIL/uL   Hemoglobin 12.0 12.0 - 15.0 g/dL   HCT 38.9 36.0 - 46.0 %   MCV 93.7 80.0 - 100.0 fL   MCH 28.9 26.0 - 34.0 pg   MCHC 30.8 30.0 - 36.0 g/dL   RDW 14.6 11.5 - 15.5 %   Platelets 526 (H) 150 - 400 K/uL   nRBC 0.0 0.0 - 0.2 %   Neutrophils Relative % 76 %   Neutro Abs 18.5 (H) 1.7 - 7.7 K/uL   Lymphocytes Relative 5 %   Lymphs Abs 1.1 0.7 - 4.0 K/uL   Monocytes Relative 5 %   Monocytes Absolute 1.1 (H) 0.1 - 1.0 K/uL   Eosinophils Relative 0 %   Eosinophils Absolute 0.0 0.0 - 0.5 K/uL   Basophils Relative  0 %   Basophils Absolute 0.0 0.0 - 0.1 K/uL   WBC Morphology INCREASED BANDS (>20% BANDS)     Comment: MILD LEFT SHIFT (1-5% METAS, OCC MYELO, OCC BANDS)   Immature Granulocytes 14 %   Abs Immature Granulocytes 3.27 (H) 0.00 - 0.07 K/uL    Comment: Performed at [facility], [address] [place], [place] [address]    ABGS No results for input(s): PHART, PO2ART, TCO2, HCO3 in the last 72 hours.  Invalid input(s): PCO2 CULTURES Recent Results (from the past 240 hour(s))  Blood culture (routine x 2)     Status: None (Preliminary result)   Collection Time: 02/03/18 10:02 AM  Result Value Ref Range Status   Specimen Description BLOOD LEFT ARM BOTTLES DRAWN AEROBIC AND ANAEROBIC  Final   Special Requests Blood Culture adequate volume  Final   Culture   Final    NO GROWTH 4 DAYS Performed at [facility], [address] [place], [place] [address]    Report Status PENDING  Incomplete  Blood culture (routine x 2)     Status: None (Preliminary result)   Collection Time: 02/03/18 10:17 AM  Result Value Ref Range Status   Specimen Description BLOOD RIGHT HAND BOTTLES DRAWN AEROBIC ONLY  Final   Special Requests Blood Culture adequate volume  Final   Culture   Final    NO GROWTH 4 DAYS Performed at [facility], [address], [place], [place] [address]    Report Status PENDING  Incomplete  MRSA PCR Screening     Status: None   Collection Time: 02/03/18  3:22 PM  Result Value Ref Range Status   MRSA by PCR NEGATIVE NEGATIVE Final    Comment:        The GeneXpert MRSA Assay (FDA approved for NASAL specimens only), is one component of a comprehensive MRSA colonization surveillance program. It is not intended to diagnose MRSA infection nor to guide or monitor treatment for MRSA infections. Performed at [facility], [address], [place], [place] [address]  Culture, sputum-assessment     Status: None   Collection Time: 02/03/18  6:04 PM  Result Value Ref Range Status    Specimen Description EXPECTORATED SPUTUM  Final   Special Requests NONE  Final   Sputum evaluation   Final    THIS SPECIMEN IS ACCEPTABLE FOR SPUTUM CULTURE Performed at [facility], [address] [place], [place] [address]    Report Status 02/03/2018 FINAL  Final  Culture, respiratory     Status: None   Collection Time: 02/03/18  6:04 PM  Result Value Ref Range Status   Specimen Description   Final    EXPECTORATED SPUTUM Performed at [facility], [address], [place], [place] [address]    Special Requests   Final    NONE Reflexed from [phone] Performed at [facility], [address] [place], [place] [address]    Gram Stain   Final    ABUNDANT WBC PRESENT, PREDOMINANTLY PMN FEW GRAM POSITIVE COCCI IN PAIRS IN CLUSTERS RARE GRAM POSITIVE RODS Performed at [facility], [place] [address] [place], [place] [address]    Culture FEW Consistent with normal respiratory flora.  Final   Report Status 02/06/2018 FINAL  Final   Studies/Results: Dg Chest Port 1 View  Result Date: 02/06/2018 CLINICAL DATA:  Pneumonia. EXAM: PORTABLE CHEST 1 VIEW COMPARISON:  Chest x-ray dated February 03, 2018. FINDINGS: Stable cardiomediastinal silhouette. New pulmonary vascular congestion and mild interstitial edema. Progressive hazy and consolidative airspace disease in the bilateral mid to lower lungs. New small right pleural effusion. Unchanged small left pleural effusion. No pneumothorax. No acute osseous abnormality. IMPRESSION: 1. Progressive bilateral airspace disease concerning for worsening multifocal pneumonia, potentially superimposed on a degree of pulmonary alveolar edema given new pulmonary vascular congestion and interstitial edema. 2. Small bilateral pleural effusions. Electronically Signed   By: Titus Dubin M.D.   On: 02/06/2018 10:47    Medications:  Prior to Admission:  Medications Prior to Admission  Medication Sig Dispense Refill Last Dose  . acetaminophen (TYLENOL) 500 MG  tablet Take 1,000 mg by mouth at bedtime.    02/02/2018  . amLODipine-benazepril (LOTREL) 10-20 MG capsule Take 1 capsule by mouth daily.    02/03/2018 at Unknown time  . conjugated estrogens (PREMARIN) vaginal cream Place 1 Applicatorful vaginally at bedtime.   02/02/2018  . mirabegron ER (MYRBETRIQ) 25 MG TB24 tablet Take 25 mg by mouth daily.   02/03/2018  . sertraline (ZOLOFT) 50 MG tablet Take 50 mg by mouth daily.    02/03/2018 at Unknown time  . traZODone (DESYREL) 50 MG tablet Take 50 mg by mouth at bedtime.   02/02/2018   Scheduled: . conjugated estrogens  1 Applicatorful Vaginal QHS  . furosemide  40 mg Intravenous Q12H  . guaiFENesin  1,200 mg Oral BID  . heparin  5,000 Units Subcutaneous Q8H  . ipratropium-albuterol  3 mL Nebulization TID  . metoprolol tartrate  10 mg Intravenous Q6H  . mirabegron ER  25 mg Oral Daily  . sertraline  100 mg Oral Daily  . traZODone  50 mg Oral QHS   Continuous: . piperacillin-tazobactam (ZOSYN)  IV 3.375 g (02/07/18 0615)   IHK:[id number], albuterol, ALPRAZolam, LORazepam, ondansetron (ZOFRAN) IV  Assesment: She was admitted with healthcare associated pneumonia likely aspiration and acute hypoxic respiratory failure.  She is had acute kidney injury and that was better.  This appeared to be related mostly to dehydration  She became acutely worse yesterday with elevated heart rate with  atrial fib and chest x-ray was consistent with increased pneumonia plus minus heart failure.  She remains agitated Active Problems:   Essential hypertension, benign   HCAP (healthcare-associated pneumonia)   AKI (acute kidney injury) ([place])   Dehydration   Acute respiratory failure with hypoxia ([place])    Plan: She is on Lasix.  She is on metoprolol IV.  She remains on IV antibiotics.  She is worse.  I am concerned she may not survive.  Start morphine for comfort.  I discussed by phone with her daughter who understands her very poor prognosis    LOS: 4 days    Alonza Bogus 02/07/2018, 8:31 AM

## 2018-02-08 DIAGNOSIS — J9601 Acute respiratory failure with hypoxia: Secondary | ICD-10-CM

## 2018-02-08 DIAGNOSIS — J189 Pneumonia, unspecified organism: Secondary | ICD-10-CM

## 2018-02-08 DIAGNOSIS — R0600 Dyspnea, unspecified: Secondary | ICD-10-CM

## 2018-02-08 DIAGNOSIS — R451 Restlessness and agitation: Secondary | ICD-10-CM

## 2018-02-08 DIAGNOSIS — E860 Dehydration: Secondary | ICD-10-CM

## 2018-02-08 DIAGNOSIS — Z515 Encounter for palliative care: Secondary | ICD-10-CM

## 2018-02-08 LAB — CULTURE, BLOOD (ROUTINE X 2)
CULTURE: NO GROWTH
Culture: NO GROWTH
SPECIAL REQUESTS: ADEQUATE
Special Requests: ADEQUATE

## 2018-02-08 MED ORDER — GLYCOPYRROLATE 0.2 MG/ML IJ SOLN: 0.2000 mg | INTRAMUSCULAR | Status: DC | PRN

## 2018-02-08 MED ORDER — FUROSEMIDE 10 MG/ML IJ SOLN
40.0000 mg | Freq: Every day | INTRAMUSCULAR | Status: DC
  Administered 2018-02-08: 40 mg via INTRAVENOUS
  Filled 2018-02-08: qty 4

## 2018-02-08 MED ORDER — LORAZEPAM 2 MG/ML IJ SOLN: 0.5000 mg | INTRAMUSCULAR | Status: DC | PRN

## 2018-02-08 MED ORDER — MORPHINE SULFATE (PF) 2 MG/ML IV SOLN
2.0000 mg | INTRAVENOUS | Status: DC | PRN
  Administered 2018-02-08 (×2): 2 mg via INTRAVENOUS
  Filled 2018-02-08 (×2): qty 1

## 2018-02-08 MED ORDER — BISACODYL 10 MG RE SUPP: 10.0000 mg | Freq: Every day | RECTAL | Status: DC | PRN

## 2018-02-08 MED ORDER — METOPROLOL TARTRATE 5 MG/5ML IV SOLN
5.0000 mg | Freq: Four times a day (QID) | INTRAVENOUS | Status: DC
  Administered 2018-02-08: 5 mg via INTRAVENOUS
  Filled 2018-02-08: qty 5

## 2018-02-08 MED ORDER — METOPROLOL TARTRATE 5 MG/5ML IV SOLN: 5.0000 mg | Freq: Four times a day (QID) | INTRAVENOUS | Status: DC | PRN

## 2018-02-08 MED ORDER — MORPHINE SULFATE (PF) 2 MG/ML IV SOLN
1.0000 mg | Freq: Four times a day (QID) | INTRAVENOUS | Status: DC
  Administered 2018-02-08 – 2018-02-11 (×9): 1 mg via INTRAVENOUS
  Filled 2018-02-08 (×11): qty 1

## 2018-02-08 NOTE — Consult Note (Signed)
Consultation Note Date: 02/08/2018   Patient Name: [name]  DOB: [birth year]  MRN: [id number]  Age / Sex: 85 y.o., female  PCP: Sinda Du, MD Referring Physician: Sinda Du, MD  Reason for Consultation: Establishing goals of care and Terminal Care  HPI/Patient Profile: 85 y.o. female  with past medical history of CVA, pneumonia, overactive bladder, HTN, anxiety, mild dementia admitted on 02/03/2018 with shortness of breath and oxygen saturation 70% on room air. Patient admitted with healthcare associated pneumonia and acute hypoxic respiratory failure. Initially clinically improved but then worsening status with agitation, afib RVR/tachycardia, and high oxygen requirements. Repeat CXR revealed worsening pneumonia and CHF. Palliative medicine consultation for goals of care/EOL care.   Clinical Assessment and Goals of Care:  I have reviewed medical records, discussed with care team, and met with patient and daughter [name]) at bedside to discuss diagnosis, prognosis, GOC, EOL wishes, disposition and options.  Patient intermittently will wake to voice but remains mildly agitated/uncomfortable and mumbling. Asks for water. Declines food.   I introduced Palliative Medicine as specialized medical care for people living with serious illness. It focuses on providing relief from the symptoms and stress of a serious illness.  We discussed a brief life review of the patient. Widowed. Two children including [name] and her brother who lives in [place]. [name] shares that her mother has always been a "strong, determined" individual. Her husband died in his mid 34's and she persevered despite this. She lived in her home alone until the age of 21, when [name] eventually had to move her into a nursing facility.   Prior to this admission, patient still able to ambulate with cane/walker, requiring assist  with ADL's, and with good appetite. [name] shares that she has not been technically diagnosed with dementia but cognitive status has declined in the past two years.   Discussed events leading up to hospitalization and course of hospitalization including diagnoses and interventions. [name] recalls her conversation with Dr. Luan Pulling yesterday, sharing her understanding that her mother's condition has worsened since admission. [name] shares that her mother has always "bounced back" within days after an illness or hospitalization.   [name] shares that her mother has been very drowsy/lethargic today. This morning, the patient had a brief episode of clarity with [name] present and told her she wanted to die. During my visit, the patient does wake and continuously asks if she is going "home" and "how much longer?" We safely allowed her to drink water.   I attempted to elicit values and goals of care important to the patient and daughter. Advanced directives, concepts specific to code status, artifical feeding and hydration, and rehospitalization were considered and discussed. [name] shares that she is POA and her mother's wishes against life-prolonging interventions. She wishes for her mother to be as comfortable as possible and that life-prolonging interventions be discontinued.   The difference between aggressive medical intervention and comfort care was considered in light of the patient's goals of care. Discussed transition to comfort measures only to allow comfort,  peace, and dignity at EOL. Discussed comfort feeds, understanding risk for aspiration. Discussed EOL expectations. Discussed medication regimen to ensure comfort.   Introduced hospice philosophy and options. [name] is considering inpatient hospice facility if her mother is stable for transfer. We discussed watchful waiting this evening/tonight and f/u in the morning. Discussed high oxygen demand and that her mother may decline quickly once  oxygen is weaned and life-prolonging interventions are discontinued. [name] is prepared for 'anything to happen at any time.' She is in daily contact with her brother in [place], therefore he also understands his mother's condition and prognosis.   Questions and concerns were addressed.  Hard Choices booklet left for review. Therapeutic listening and emotional/spiritual support provided.     SUMMARY OF RECOMMENDATIONS    Daughter confirms her mother's wishes against life-prolonging interventions. She wishes to focus on COMFORT and discontinue interventions not aimed at comfort.   Symptom management--see below  RN to wean oxygen. Treat dyspnea with prn morphine.   Comfort feeds per patient/family request.  Spiritual consult for EOL care.   Will further discuss hospice options with daughter in AM if appropriate. Patient may continue to decline and not be stable for transfer.   Code Status/Advance Care Planning:  DNR  Symptom Management:   Morphine '1mg'$  IV scheduled q6h pain/dyspnea  RN may give Morphine '2mg'$  IV q1h prn breakthrough pain, dyspnea, air hunger  Ativan 0.'5mg'$  IV q4h prn anxiety/agitation  Robinul 0.'2mg'$  IV q4h prn secretions  Dulcolax daily prn  Palliative Prophylaxis:   Aspiration, Delirium Protocol, Frequent Pain Assessment, Oral Care and Turn Reposition  Additional Recommendations (Limitations, Scope, Preferences):  Full Comfort Care  Psycho-social/Spiritual:   Desire for further Chaplaincy support:yes  Additional Recommendations: Caregiving  Support/Resources, Compassionate Wean Education and Education on Hospice  Prognosis:   < 2 weeks likely days with acute respiratory failure with hypoxia, HCAP, CHF, aspiration, and severe decline in functional, cognitive, and nutritional status.  Discharge Planning: To Be Determined      Primary Diagnoses: Present on Admission: . HCAP (healthcare-associated pneumonia) . AKI (acute kidney injury) ([place]) .  Essential hypertension, benign . Dehydration . Acute respiratory failure with hypoxia ([place])   I have reviewed the medical record, interviewed the patient and family, and examined the patient. The following aspects are pertinent.  Past Medical History:  Diagnosis Date  . Anxiety   . Dementia ([place])   . Hypertension   . Overactive bladder   . Pneumonia   . Stroke [facility])    Social History   Socioeconomic History  . Marital status: Widowed    Spouse name: Not on file  . Number of children: Not on file  . Years of education: Not on file  . Highest education level: Not on file  Occupational History  . Not on file  Social Needs  . Financial resource strain: Not on file  . Food insecurity:    Worry: Not on file    Inability: Not on file  . Transportation needs:    Medical: Not on file    Non-medical: Not on file  Tobacco Use  . Smoking status: Never Smoker  . Smokeless tobacco: Never Used  Substance and Sexual Activity  . Alcohol use: No  . Drug use: No  . Sexual activity: Never  Lifestyle  . Physical activity:    Days per week: Not on file    Minutes per session: Not on file  . Stress: Not on file  Relationships  . Social connections:  Talks on phone: Not on file    Gets together: Not on file    Attends religious service: Not on file    Active member of club or organization: Not on file    Attends meetings of clubs or organizations: Not on file    Relationship status: Not on file  Other Topics Concern  . Not on file  Social History Narrative  . Not on file   History reviewed. No pertinent family history. Scheduled Meds: .  morphine injection  1 mg Intravenous Q6H   Continuous Infusions:  PRN Meds:.acetaminophen, albuterol, bisacodyl, glycopyrrolate, LORazepam, metoprolol tartrate, morphine injection, ondansetron (ZOFRAN) IV Medications Prior to Admission:  Prior to Admission medications   Medication Sig Start Date End Date Taking? Authorizing Provider    acetaminophen (TYLENOL) 500 MG tablet Take 1,000 mg by mouth at bedtime.    Yes [provider]  amLODipine-benazepril (LOTREL) 10-20 MG capsule Take 1 capsule by mouth daily.  06/27/16  Yes [provider]  conjugated estrogens (PREMARIN) vaginal cream Place 1 Applicatorful vaginally at bedtime.   Yes [provider]  mirabegron ER (MYRBETRIQ) 25 MG TB24 tablet Take 25 mg by mouth daily.   Yes [provider]  sertraline (ZOLOFT) 50 MG tablet Take 50 mg by mouth daily.  06/24/16  Yes [provider]  traZODone (DESYREL) 50 MG tablet Take 50 mg by mouth at bedtime.   Yes [provider]   Allergies  Allergen Reactions  . Sulfa Antibiotics    Review of Systems  Unable to perform ROS: Mental status change   Physical Exam Vitals signs and nursing note reviewed.  Constitutional:      Appearance: She is cachectic. She is ill-appearing.  HENT:     Head: Normocephalic and atraumatic.  Pulmonary:     Effort: No tachypnea, accessory muscle usage or respiratory distress.  Abdominal:     Tenderness: There is no abdominal tenderness.  Skin:    General: Skin is warm and dry.     Coloration: Skin is pale.  Neurological:     Mental Status: She is easily aroused.     Comments: Intermittently awake, disoriented, hard of hearing  Psychiatric:        Attention and Perception: She is inattentive.        Speech: Speech is delayed.        Cognition and Memory: Cognition is impaired.    Vital Signs: BP 119/60   Pulse 77   Temp 97.7 F (36.5 C) (Oral)   Resp 18   Ht '5\' 2"'$  (1.575 m)   Wt 45.3 kg   SpO2 94%   BMI 18.27 kg/m  Pain Scale: PAINAD POSS *See Group Information*: S-Acceptable,Sleep, easy to arouse Pain Score: Asleep   SpO2: SpO2: 94 % O2 Device:SpO2: 94 % O2 Flow Rate: .O2 Flow Rate (L/min): 10 L/min  IO: Intake/output summary:   Intake/Output Summary (Last 24 hours) at 02/08/2018 1501 Last data filed at 02/08/2018  0900 Gross per 24 hour  Intake 170 ml  Output -  Net 170 ml    LBM: Last BM Date: 02/07/18 Baseline Weight: Weight: 52.2 kg Most recent weight: Weight: 45.3 kg     Palliative Assessment/Data: PPS 20%   Flowsheet Rows     Most Recent Value  Intake Tab  Referral Department  Pulmonary  Unit at Time of Referral  Med/Surg Unit  Palliative Care Primary Diagnosis  Sepsis/Infectious Disease  Palliative Care Type  New Palliative care  Reason  for referral  Clarify Goals of Care, End of Life Care Assistance  Date first seen by Palliative Care  02/08/18  Clinical Assessment  Palliative Performance Scale Score  20%  Psychosocial & Spiritual Assessment  Palliative Care Outcomes  Patient/Family meeting held?  Yes  Who was at the meeting?  patient and daughter  Palliative Care Outcomes  Counseled regarding hospice, Provided psychosocial or spiritual support, Improved non-pain symptom therapy, Improved pain interventions, Provided end of life care assistance, Changed to focus on comfort, Clarified goals of care, ACP counseling assistance      Time In: 1120 Time Out: 1245 Time Total: 16mn Greater than 50%  of this time was spent counseling and coordinating care related to the above assessment and plan.  Signed by:  MIhor Dow FNP-C Palliative Medicine Team  Phone: [phone]: [phone]  Please contact Palliative Medicine Team phone at [phone] questions and concerns.  For individual provider: See AShea Evans

## 2018-02-08 NOTE — Progress Notes (Signed)
Because patient's HR was only in the 70s I only gave 5 mgs of metoprolol at 0600.

## 2018-02-08 NOTE — Progress Notes (Signed)
Subjective: She looks more comfortable.  She is still very confused.  She is received morphine and it does help.  Her heart rate is better and in fact got low.  Objective: Vital signs in last 24 hours: Temp:  [97.5 F (36.4 C)-98.5 F (36.9 C)] 97.7 F (36.5 C) (01/23 0635) Pulse Rate:  [30-129] 77 (01/23 0635) Resp:  [14-18] 18 (01/23 0635) BP: (114-147)/(54-93) 119/60 (01/23 0635) SpO2:  [91 %-98 %] 94 % (01/23 2951) Weight change:  Last BM Date: 02/07/18  Intake/Output from previous day: 01/22 0701 - 01/23 0700 In: 240 [P.O.:240] Out: -   PHYSICAL EXAM General appearance: Confused not as agitated Resp: rhonchi bilaterally Cardio: She still seems to be in atrial fib but her heart rates about 70 now GI: soft, non-tender; bowel sounds normal; no masses,  no organomegaly Extremities: extremities normal, atraumatic, no cyanosis or edema  Lab Results:  Results for orders placed or performed during the hospital encounter of 02/03/18 (from the past 48 hour(s))  Basic metabolic panel     Status: Abnormal   Collection Time: 02/07/18  5:40 AM  Result Value Ref Range   Sodium 146 (H) 135 - 145 mmol/L   Potassium 3.7 3.5 - 5.1 mmol/L    Comment: DELTA CHECK NOTED   Chloride 106 98 - 111 mmol/L   CO2 27 22 - 32 mmol/L   Glucose, Bld 133 (H) 70 - 99 mg/dL   BUN 27 (H) 8 - 23 mg/dL   Creatinine, Ser 0.80 0.44 - 1.00 mg/dL   Calcium 8.9 8.9 - 10.3 mg/dL   GFR calc non Af Amer >60 >60 mL/min   GFR calc Af Amer >60 >60 mL/min   Anion gap 13 5 - 15    Comment: Performed at [facility], [address] [place], [place] [address]  CBC with Differential/Platelet     Status: Abnormal   Collection Time: 02/07/18  5:40 AM  Result Value Ref Range   WBC 24.1 (H) 4.0 - 10.5 K/uL   RBC 4.15 3.87 - 5.11 MIL/uL   Hemoglobin 12.0 12.0 - 15.0 g/dL   HCT 38.9 36.0 - 46.0 %   MCV 93.7 80.0 - 100.0 fL   MCH 28.9 26.0 - 34.0 pg   MCHC 30.8 30.0 - 36.0 g/dL   RDW 14.6 11.5 - 15.5 %   Platelets  526 (H) 150 - 400 K/uL   nRBC 0.0 0.0 - 0.2 %   Neutrophils Relative % 76 %   Neutro Abs 18.5 (H) 1.7 - 7.7 K/uL   Lymphocytes Relative 5 %   Lymphs Abs 1.1 0.7 - 4.0 K/uL   Monocytes Relative 5 %   Monocytes Absolute 1.1 (H) 0.1 - 1.0 K/uL   Eosinophils Relative 0 %   Eosinophils Absolute 0.0 0.0 - 0.5 K/uL   Basophils Relative 0 %   Basophils Absolute 0.0 0.0 - 0.1 K/uL   WBC Morphology INCREASED BANDS (>20% BANDS)     Comment: MILD LEFT SHIFT (1-5% METAS, OCC MYELO, OCC BANDS)   Immature Granulocytes 14 %   Abs Immature Granulocytes 3.27 (H) 0.00 - 0.07 K/uL    Comment: Performed at [facility], [address] [place], [place] [address]    ABGS No results for input(s): PHART, PO2ART, TCO2, HCO3 in the last 72 hours.  Invalid input(s): PCO2 CULTURES Recent Results (from the past 240 hour(s))  Blood culture (routine x 2)     Status: None (Preliminary result)   Collection Time: 02/03/18 10:02  AM  Result Value Ref Range Status   Specimen Description BLOOD LEFT ARM BOTTLES DRAWN AEROBIC AND ANAEROBIC  Final   Special Requests Blood Culture adequate volume  Final   Culture   Final    NO GROWTH 4 DAYS Performed at [facility], [address], [place], [place] [address]    Report Status PENDING  Incomplete  Blood culture (routine x 2)     Status: None (Preliminary result)   Collection Time: 02/03/18 10:17 AM  Result Value Ref Range Status   Specimen Description BLOOD RIGHT HAND BOTTLES DRAWN AEROBIC ONLY  Final   Special Requests Blood Culture adequate volume  Final   Culture   Final    NO GROWTH 4 DAYS Performed at [facility], [address], [place], [place] [address]    Report Status PENDING  Incomplete  MRSA PCR Screening     Status: None   Collection Time: 02/03/18  3:22 PM  Result Value Ref Range Status   MRSA by PCR NEGATIVE NEGATIVE Final    Comment:        The GeneXpert MRSA Assay (FDA approved for NASAL specimens only), is one component of a comprehensive  MRSA colonization surveillance program. It is not intended to diagnose MRSA infection nor to guide or monitor treatment for MRSA infections. Performed at [facility], [address] [place], [place] [address]   Culture, sputum-assessment     Status: None   Collection Time: 02/03/18  6:04 PM  Result Value Ref Range Status   Specimen Description EXPECTORATED SPUTUM  Final   Special Requests NONE  Final   Sputum evaluation   Final    THIS SPECIMEN IS ACCEPTABLE FOR SPUTUM CULTURE Performed at [facility], [address] [place], [place] [address]    Report Status 02/03/2018 FINAL  Final  Culture, respiratory     Status: None   Collection Time: 02/03/18  6:04 PM  Result Value Ref Range Status   Specimen Description   Final    EXPECTORATED SPUTUM Performed at [facility], [address] [place], [place] [address]    Special Requests   Final    NONE Reflexed from [id number] Performed at [facility], [address] [place], [place] [address]    Gram Stain   Final    ABUNDANT WBC PRESENT, PREDOMINANTLY PMN FEW GRAM POSITIVE COCCI IN PAIRS IN CLUSTERS RARE GRAM POSITIVE RODS Performed at [facility], [place] [address], [place],  [address]    Culture FEW Consistent with normal respiratory flora.  Final   Report Status 02/06/2018 FINAL  Final   Studies/Results: Dg Chest Port 1 View  Result Date: 02/06/2018 CLINICAL DATA:  Pneumonia. EXAM: PORTABLE CHEST 1 VIEW COMPARISON:  Chest x-ray dated February 03, 2018. FINDINGS: Stable cardiomediastinal silhouette. New pulmonary vascular congestion and mild interstitial edema. Progressive hazy and consolidative airspace disease in the bilateral mid to lower lungs. New small right pleural effusion. Unchanged small left pleural effusion. No pneumothorax. No acute osseous abnormality. IMPRESSION: 1. Progressive bilateral airspace disease concerning for worsening multifocal pneumonia, potentially superimposed on a degree of pulmonary  alveolar edema given new pulmonary vascular congestion and interstitial edema. 2. Small bilateral pleural effusions. Electronically Signed   By: Titus Dubin M.D.   On: 02/06/2018 10:47    Medications:  Prior to Admission:  Medications Prior to Admission  Medication Sig Dispense Refill Last Dose  . acetaminophen (TYLENOL) 500 MG tablet Take 1,000 mg by mouth at bedtime.  02/02/2018  . amLODipine-benazepril (LOTREL) 10-20 MG capsule Take 1 capsule by mouth daily.    02/03/2018 at Unknown time  . conjugated estrogens (PREMARIN) vaginal cream Place 1 Applicatorful vaginally at bedtime.   02/02/2018  . mirabegron ER (MYRBETRIQ) 25 MG TB24 tablet Take 25 mg by mouth daily.   02/03/2018  . sertraline (ZOLOFT) 50 MG tablet Take 50 mg by mouth daily.    02/03/2018 at Unknown time  . traZODone (DESYREL) 50 MG tablet Take 50 mg by mouth at bedtime.   02/02/2018   Scheduled: . conjugated estrogens  1 Applicatorful Vaginal QHS  . furosemide  40 mg Intravenous Daily  . guaiFENesin  1,200 mg Oral BID  . heparin  5,000 Units Subcutaneous Q8H  . ipratropium-albuterol  3 mL Nebulization TID  . metoprolol tartrate  5 mg Intravenous Q6H  . mirabegron ER  25 mg Oral Daily  . sertraline  100 mg Oral Daily  . traZODone  50 mg Oral QHS   Continuous: . piperacillin-tazobactam (ZOSYN)  IV 3.375 g (02/08/18 1791)   TAV:[id number], albuterol, ALPRAZolam, LORazepam, morphine injection, ondansetron (ZOFRAN) IV  Assesment: She was admitted with healthcare associated pneumonia and acute hypoxic respiratory failure.  She had initially improved and then got worse.  I think she has some element of heart failure.  She has had hypoxia and is still requiring oxygen.  She I think is aspirated again and is much worse.  She became very agitated.  We have turned toward a more palliative course and she is on morphine as well as Ativan now. Active Problems:   Essential hypertension, benign   HCAP (healthcare-associated  pneumonia)   AKI (acute kidney injury) ([place])   Dehydration   Acute respiratory failure with hypoxia ([place])    Plan: We will discussed by phone with daughter again today.  Probable palliative care consultation.  We may switch to strictly comfort care.    LOS: 5 days   [name] 02/08/2018, 8:42 AM

## 2018-02-09 DIAGNOSIS — N179 Acute kidney failure, unspecified: Secondary | ICD-10-CM

## 2018-02-09 NOTE — Care Management Important Message (Signed)
Important Message  Patient Details  Name: [name] MRN: [id number] Date of Birth: 01-28-1920   Medicare Important Message Given:  Yes    Sherald Barge, RN 02/09/2018, 1:37 PM

## 2018-02-09 NOTE — Progress Notes (Signed)
Subjective: She has apparently had waxing and waning mental status for the last 18 hours or so.  At this time she is awake and able to tell me that she does not feel well and she is coughing up sputum.  Objective: Vital signs in last 24 hours: Temp:  [98.4 F (36.9 C)] 98.4 F (36.9 C) (01/24 0625) Pulse Rate:  [85] 85 (01/24 0625) Resp:  [18] 18 (01/24 0625) BP: (157)/(83) 157/83 (01/24 0625) SpO2:  [95 %-96 %] 95 % (01/24 0625) Weight change:  Last BM Date: 02/07/18  Intake/Output from previous day: 01/23 0701 - 01/24 0700 In: 15 [P.O.:50] Out: -   PHYSICAL EXAM General appearance: alert and mild distress Resp: rhonchi bilaterally Cardio: irregularly irregular rhythm GI: soft, non-tender; bowel sounds normal; no masses,  no organomegaly Extremities: extremities normal, atraumatic, no cyanosis or edema  Lab Results:  No results found for this or any previous visit (from the past 48 hour(s)).  ABGS No results for input(s): PHART, PO2ART, TCO2, HCO3 in the last 72 hours.  Invalid input(s): PCO2 CULTURES Recent Results (from the past 240 hour(s))  Blood culture (routine x 2)     Status: None   Collection Time: 02/03/18 10:02 AM  Result Value Ref Range Status   Specimen Description BLOOD LEFT ARM BOTTLES DRAWN AEROBIC AND ANAEROBIC  Final   Special Requests Blood Culture adequate volume  Final   Culture   Final    NO GROWTH 5 DAYS Performed at [facility], [address], [place], [place] [address]    Report Status 02/08/2018 FINAL  Final  Blood culture (routine x 2)     Status: None   Collection Time: 02/03/18 10:17 AM  Result Value Ref Range Status   Specimen Description BLOOD RIGHT HAND BOTTLES DRAWN AEROBIC ONLY  Final   Special Requests Blood Culture adequate volume  Final   Culture   Final    NO GROWTH 5 DAYS Performed at [facility], [address], [place], [place] [address]    Report Status 02/08/2018 FINAL  Final  MRSA PCR Screening     Status: None   Collection Time: 02/03/18  3:22 PM  Result Value Ref Range Status   MRSA by PCR NEGATIVE NEGATIVE Final    Comment:        The GeneXpert MRSA Assay (FDA approved for NASAL specimens only), is one component of a comprehensive MRSA colonization surveillance program. It is not intended to diagnose MRSA infection nor to guide or monitor treatment for MRSA infections. Performed at [facility], [address], [place], [place] [address]   Culture, sputum-assessment     Status: None   Collection Time: 02/03/18  6:04 PM  Result Value Ref Range Status   Specimen Description EXPECTORATED SPUTUM  Final   Special Requests NONE  Final   Sputum evaluation   Final    THIS SPECIMEN IS ACCEPTABLE FOR SPUTUM CULTURE Performed at [facility], [address] [place], [place] [address]    Report Status 02/03/2018 FINAL  Final  Culture, respiratory     Status: None   Collection Time: 02/03/18  6:04 PM  Result Value Ref Range Status   Specimen Description   Final    EXPECTORATED SPUTUM Performed at [facility], [address], [place], [place] [address]    Special Requests   Final    NONE Reflexed from [id number] Performed at [facility], [address] [place], [place] [address]    Gram Stain  Final    ABUNDANT WBC PRESENT, PREDOMINANTLY PMN FEW GRAM POSITIVE COCCI IN PAIRS IN CLUSTERS RARE GRAM POSITIVE RODS Performed at [facility], [place] [address] [place], [place] [address]    Culture FEW Consistent with normal respiratory flora.  Final   Report Status 02/06/2018 FINAL  Final   Studies/Results: No results found.  Medications:  Prior to Admission:  Medications Prior to Admission  Medication Sig Dispense Refill Last Dose  . acetaminophen (TYLENOL) 500 MG tablet Take 1,000 mg by mouth at bedtime.    02/02/2018  . amLODipine-benazepril (LOTREL) 10-20 MG capsule Take 1 capsule by mouth daily.    02/03/2018 at Unknown time  . conjugated estrogens (PREMARIN) vaginal cream Place 1  Applicatorful vaginally at bedtime.   02/02/2018  . mirabegron ER (MYRBETRIQ) 25 MG TB24 tablet Take 25 mg by mouth daily.   02/03/2018  . sertraline (ZOLOFT) 50 MG tablet Take 50 mg by mouth daily.    02/03/2018 at Unknown time  . traZODone (DESYREL) 50 MG tablet Take 50 mg by mouth at bedtime.   02/02/2018   Scheduled: .  morphine injection  1 mg Intravenous Q6H   Continuous:  XOV:ANVBTYOMAYOKH, albuterol, bisacodyl, glycopyrrolate, LORazepam, metoprolol tartrate, morphine injection, ondansetron (ZOFRAN) IV  Assesment: She was admitted with healthcare associated pneumonia probably from aspiration.  She had deteriorated substantially over the last 48 hours or so.  She is now with comfort care.  She is more alert this morning and is able to cough up some sputum.  She still has very high oxygen requirement.  She has persistent atrial fib which is a fairly new diagnosis and she is on treatment for that   Active Problems:   Essential hypertension, benign   HCAP (healthcare-associated pneumonia)   AKI (acute kidney injury) ([place])   Dehydration   Acute respiratory failure with hypoxia [facility])   Palliative care by specialist   Terminal care   Agitation   Dyspnea    Plan: Continue treatments.  She is on comfort care.  She may need inpatient hospice care depending on how she does    LOS: 6 days   [name] 02/09/2018, 8:31 AM

## 2018-02-09 NOTE — Care Management Important Message (Signed)
Important Message  Patient Details  Name: UNNAMED HINO MRN: [id number] Date of Birth: 1920-05-17   Medicare Important Message Given: Pt. Unable to sign due to medial conditiion.   Peja, Allender [place] 02/09/2018, 3:14 PM

## 2018-02-09 NOTE — Progress Notes (Signed)
Daily Progress Note   Patient Name: [name]       Date: 02/09/2018 DOB: 1920-07-04  Age: 85 y.o. MRN#: [id number] Attending Physician: Sinda Du, MD Primary Care Physician: Sinda Du, MD Admit Date: 02/03/2018  Reason for Consultation/Follow-up: Establishing goals of care and Terminal Care  Subjective/GOC: 9:15: Patient wakes to voice. She can tell me her name. She is disoriented to place and situation. Reoriented her. She had bites and sips for breakfast. She politely declines anymore food from me. She denies pain or shortness of breath.   Oxygen decreased from 8L to 4L. Oxygen saturation checked and stable at 94%. Patient does not appear short of breath or uncomfortable with decrease in oxygen. HR 58 manually.   F/u with patient this afternoon. Upon arrival to room, patient is awake and continuously tells me "I want to die" and "let me die." Patient does not appear to be in respiratory distress or discomfort. She denies pain or shortness of breath. I explained that the focus on her care is for comfort measures but that it is out of our control when she will die. I asked her if she wishes to go to hospice. She does not answer this question but tells me she wants to be "comfortable with the Lord."   Discussed with Dr. Luan Pulling. Plan is to wait on residential hospice referral today. Dr. Luan Pulling will further evaluate over the weekend. PMT not at [facility] over the weekend.   Also updated daughter via telephone. Therapeutic listening as [name] shares that for years, her mother has wished to pass on. She has outlived all of her siblings and many family members. Confirmed plan to focus on comfort measures and symptom management. Discussed comfort feeds. Shirley trusts Dr. Luan Pulling and PMT  decision to pursue residential hospice when appropriate. [name] has PMT contact information but understands PMT provider is not at [facility] over the weekend.    Length of Stay: 6  Current Medications: Scheduled Meds:  .  morphine injection  1 mg Intravenous Q6H    Continuous Infusions:   PRN Meds: acetaminophen, albuterol, bisacodyl, glycopyrrolate, LORazepam, metoprolol tartrate, morphine injection, ondansetron (ZOFRAN) IV  Physical Exam Vitals signs and nursing note reviewed.  Constitutional:      Appearance: She is cachectic.  HENT:     Head: Normocephalic and atraumatic.  Cardiovascular:     Rate and Rhythm: Rhythm regularly irregular.  Pulmonary:     Effort: No tachypnea, accessory muscle usage or respiratory distress.     Comments: Decreased oxygen from 8L to 4L. Sats remained stable. Abdominal:     Tenderness: There is no abdominal tenderness.  Skin:    General: Skin is warm and dry.     Coloration: Skin is pale.  Neurological:     Mental Status: She is easily aroused.     Comments: Wakes to voice. Oriented to self. Reoriented to place/situation. Hard of hearing but answering questions and following commands.            Vital Signs: BP (!) 157/83   Pulse 85   Temp 98.4 F (36.9 C) (Oral)   Resp 18   Ht 5\' 2"  (1.575 m)   Wt 45.3 kg   SpO2 95%   BMI 18.27 kg/m  SpO2: SpO2: 95 % O2 Device: O2 Device: Nasal Cannula O2 Flow Rate: O2 Flow Rate (L/min): 8 L/min  Intake/output summary: No intake or output data in the 24 hours ending 02/09/18 0921 LBM: Last BM Date: 02/07/18 Baseline Weight: Weight: 52.2 kg Most recent weight: Weight: 45.3 kg       Palliative Assessment/Data:PPS 20%   Flowsheet Rows     Most Recent Value  Intake Tab  Referral Department  Pulmonary  Unit at Time of Referral  Med/Surg Unit  Palliative Care Primary Diagnosis  Sepsis/Infectious Disease  Palliative Care Type  New Palliative care  Reason for referral  Clarify Goals of Care, End  of Life Care Assistance  Date first seen by Palliative Care  02/08/18  Clinical Assessment  Palliative Performance Scale Score  20%  Psychosocial & Spiritual Assessment  Palliative Care Outcomes  Patient/Family meeting held?  Yes  Who was at the meeting?  patient and daughter  Palliative Care Outcomes  Counseled regarding hospice, Provided psychosocial or spiritual support, Improved non-pain symptom therapy, Improved pain interventions, Provided end of life care assistance, Changed to focus on comfort, Clarified goals of care, ACP counseling assistance      Patient Active Problem List   Diagnosis Date Noted  . Palliative care by specialist   . Terminal care   . Agitation   . Dyspnea   . HCAP (healthcare-associated pneumonia) 02/03/2018  . AKI (acute kidney injury) ([place]) 02/03/2018  . Dehydration 02/03/2018  . Acute respiratory failure with hypoxia ([place]) 02/03/2018  . Pneumonia 12/17/2017  . Altered mental status 07/10/2016  . Urinary incontinence 07/10/2016  . History of CVA (cerebrovascular accident) - by CT scan 07/10/2016  . Compression fracture of L2 lumbar vertebra ([facility]) 11/25/2015  . Compression fracture of spine, non-traumatic ([place]) 11/25/2015  . Osteoporosis 11/25/2015  . Peripheral arterial disease ([place]) 11/25/2015  . Pyelonephritis, acute 11/23/2015  . UTI (urinary tract infection) 11/23/2015  . Community acquired pneumonia 07/11/2014  . Urinary tract infection, site not specified 03/01/2012  . Acute delirium 03/01/2012  . Essential hypertension, benign 03/01/2012  . Hypokalemia 03/01/2012    Palliative Care Assessment & Plan   Patient Profile: 85 y.o. female  with past medical history of CVA, pneumonia, overactive bladder, HTN, anxiety, mild dementia admitted on 02/03/2018 with shortness of breath and oxygen saturation 70% on room air. Patient admitted with healthcare associated pneumonia and acute hypoxic respiratory failure. Initially clinically improved but then  worsening status with agitation, afib RVR/tachycardia, and high oxygen requirements. Repeat CXR revealed worsening pneumonia and CHF. Palliative medicine consultation for  goals of care/EOL care.   Assessment: Acute respiratory failure with hypoxia HCAP AKI Dehydration Anxiety state  Recommendations/Plan:  Comfort focused care. Interventions not aimed at comfort have been discontinued.    Possible surge of energy today? Patient's mental status is waxing and waning. Discussed with Dr. Luan Pulling. Will wait on residential hospice referral pending clinical status. Oxygen has been weaned today.  Continue prn medications to ensure comfort.   Goals of Care and Additional Recommendations:  Limitations on Scope of Treatment: Full Comfort Care and Minimize Medications  Code Status: DNR/DNI   Code Status Orders  (From admission, onward)         Start     Ordered   02/03/18 1634  Do not attempt resuscitation (DNR)  Continuous    Question Answer Comment  In the event of cardiac or respiratory ARREST Do not call a "code blue"   In the event of cardiac or respiratory ARREST Do not perform Intubation, CPR, defibrillation or ACLS   In the event of cardiac or respiratory ARREST Use medication by any route, position, wound care, and other measures to relive pain and suffering. May use oxygen, suction and manual treatment of airway obstruction as needed for comfort.      02/03/18 1635        Code Status History    Date Active Date Inactive Code Status Order ID Comments User Context   02/03/2018 3532 02/03/2018 1635 DNR [id number]  Kathie Dike, MD ED   12/18/2017 0845 12/21/2017 1756 DNR [id number]  Sinda Du, MD Inpatient   12/17/2017 2035 12/18/2017 0845 Full Code [id number]  Bethena Roys, MD Inpatient   07/10/2016 2320 07/13/2016 2124 Full Code [id number]  Roney Jaffe, MD Inpatient   11/23/2015 1602 11/25/2015 1701 DNR [id number]  Thurnell Lose, MD Inpatient   07/11/2014 1645  07/12/2014 1854 Full Code [id number]  Doree Albee, MD ED    Advance Directive Documentation     Most Recent Value  Type of Advance Directive  Out of facility DNR (pink MOST or yellow form)  Pre-existing out of facility DNR order (yellow form or pink MOST form)  -  "MOST" Form in Place?  -       Prognosis:   Unable to determine: poor  Discharge Planning:  To Be Determined  Care plan was discussed with patient, RN, Dr Luan Pulling, daughter  Thank you for allowing the Palliative Medicine Team to assist in the care of this patient.   Time In: 0915- 1230- Time Out: 0935 1250 Total Time 40 Prolonged Time Billed   no      Greater than 50%  of this time was spent counseling and coordinating care related to the above assessment and plan.  Ihor Dow, FNP-C Palliative Medicine Team  Phone: [phone] Fax: [phone]  Please contact Palliative Medicine Team phone at [phone] for questions and concerns.

## 2018-02-10 MED ORDER — METOPROLOL TARTRATE 25 MG PO TABS
25.0000 mg | ORAL_TABLET | Freq: Two times a day (BID) | ORAL | Status: DC
  Administered 2018-02-10 – 2018-02-14 (×6): 25 mg via ORAL
  Filled 2018-02-10 (×8): qty 1

## 2018-02-10 NOTE — Progress Notes (Signed)
Subjective: She is more alert.  She says she feels okay.  She recognizes me today.  Objective: Vital signs in last 24 hours: Temp:  [97.9 F (36.6 C)-98.3 F (36.8 C)] 97.9 F (36.6 C) (01/25 4580) Pulse Rate:  [57-71] 57 (01/25 0633) Resp:  [18] 18 (01/25 0633) BP: (134-149)/(83-90) 149/83 (01/25 9983) SpO2:  [92 %-97 %] 97 % (01/25 3825) Weight change:  Last BM Date: 02/07/18  Intake/Output from previous day: 01/24 0701 - 01/25 0700 In: 50 [P.O.:50] Out: -   PHYSICAL EXAM General appearance: alert and mild distress Resp: clear to auscultation bilaterally Cardio: irregularly irregular rhythm GI: soft, non-tender; bowel sounds normal; no masses,  no organomegaly Extremities: extremities normal, atraumatic, no cyanosis or edema  Lab Results:  No results found for this or any previous visit (from the past 48 hour(s)).  ABGS No results for input(s): PHART, PO2ART, TCO2, HCO3 in the last 72 hours.  Invalid input(s): PCO2 CULTURES Recent Results (from the past 240 hour(s))  Blood culture (routine x 2)     Status: None   Collection Time: 02/03/18 10:02 AM  Result Value Ref Range Status   Specimen Description BLOOD LEFT ARM BOTTLES DRAWN AEROBIC AND ANAEROBIC  Final   Special Requests Blood Culture adequate volume  Final   Culture   Final    NO GROWTH 5 DAYS Performed at [facility], [address] [place], [place] [address]    Report Status 02/08/2018 FINAL  Final  Blood culture (routine x 2)     Status: None   Collection Time: 02/03/18 10:17 AM  Result Value Ref Range Status   Specimen Description BLOOD RIGHT HAND BOTTLES DRAWN AEROBIC ONLY  Final   Special Requests Blood Culture adequate volume  Final   Culture   Final    NO GROWTH 5 DAYS Performed at [facility], [address], [place], [place] [address]    Report Status 02/08/2018 FINAL  Final  MRSA PCR Screening     Status: None   Collection Time: 02/03/18  3:22 PM  Result Value Ref Range Status   MRSA by  PCR NEGATIVE NEGATIVE Final    Comment:        The GeneXpert MRSA Assay (FDA approved for NASAL specimens only), is one component of a comprehensive MRSA colonization surveillance program. It is not intended to diagnose MRSA infection nor to guide or monitor treatment for MRSA infections. Performed at [facility], [address], [place], [place] [address]   Culture, sputum-assessment     Status: None   Collection Time: 02/03/18  6:04 PM  Result Value Ref Range Status   Specimen Description EXPECTORATED SPUTUM  Final   Special Requests NONE  Final   Sputum evaluation   Final    THIS SPECIMEN IS ACCEPTABLE FOR SPUTUM CULTURE Performed at [facility], [address] [place], [place] [address]    Report Status 02/03/2018 FINAL  Final  Culture, respiratory     Status: None   Collection Time: 02/03/18  6:04 PM  Result Value Ref Range Status   Specimen Description   Final    EXPECTORATED SPUTUM Performed at [facility], [address], [place], [place] [address]    Special Requests   Final    NONE Reflexed from [id number] Performed at [facility], [address], [place], [place] [address]    Gram Stain   Final    ABUNDANT WBC PRESENT, PREDOMINANTLY PMN FEW GRAM POSITIVE COCCI IN PAIRS IN CLUSTERS RARE Lonell Grandchild  POSITIVE RODS Performed at [facility], [place] [address], [place], [place] [address]    Culture FEW Consistent with normal respiratory flora.  Final   Report Status 02/06/2018 FINAL  Final   Studies/Results: No results found.  Medications:  Prior to Admission:  Medications Prior to Admission  Medication Sig Dispense Refill Last Dose  . acetaminophen (TYLENOL) 500 MG tablet Take 1,000 mg by mouth at bedtime.    02/02/2018  . amLODipine-benazepril (LOTREL) 10-20 MG capsule Take 1 capsule by mouth daily.    02/03/2018 at Unknown time  . conjugated estrogens (PREMARIN) vaginal cream Place 1 Applicatorful vaginally at bedtime.   02/02/2018  . mirabegron ER (MYRBETRIQ) 25  MG TB24 tablet Take 25 mg by mouth daily.   02/03/2018  . sertraline (ZOLOFT) 50 MG tablet Take 50 mg by mouth daily.    02/03/2018 at Unknown time  . traZODone (DESYREL) 50 MG tablet Take 50 mg by mouth at bedtime.   02/02/2018   Scheduled: .  morphine injection  1 mg Intravenous Q6H   Continuous:  [id number], albuterol, bisacodyl, glycopyrrolate, LORazepam, metoprolol tartrate, morphine injection, ondansetron (ZOFRAN) IV  Assesment: She was admitted with healthcare associated pneumonia.  She has had atrial fib with rapid ventricular response.  She has had acute heart failure but is not clear if it systolic or diastolic and I am not going to order an echocardiogram considering her overall situation.  She has improved with all of those.  She had severe hypoxic respiratory failure and that is better.  It appeared that she was going to die imminently about 72 hours ago but now she is almost back to baseline. Active Problems:   Essential hypertension, benign   HCAP (healthcare-associated pneumonia)   AKI (acute kidney injury) ([place])   Dehydration   Acute respiratory failure with hypoxia [facility])   Palliative care by specialist   Terminal care   Agitation   Dyspnea    Plan: Continue treatments    LOS: 7 days   [name] 02/10/2018, 9:34 AM

## 2018-02-11 MED ORDER — LORAZEPAM 0.5 MG PO TABS
0.5000 mg | ORAL_TABLET | ORAL | Status: DC | PRN
  Administered 2018-02-11: 0.5 mg via ORAL
  Filled 2018-02-11: qty 1

## 2018-02-11 MED ORDER — MORPHINE SULFATE (CONCENTRATE) 10 MG/0.5ML PO SOLN
2.5000 mg | ORAL | Status: DC | PRN
  Administered 2018-02-11 – 2018-02-12 (×3): 2.6 mg via ORAL
  Filled 2018-02-11 (×3): qty 0.5

## 2018-02-11 NOTE — Progress Notes (Signed)
Subjective: She continues to be substantially better.  She is awake and alert.  No complaints.  She has lost IV access.  Objective: Vital signs in last 24 hours: Temp:  [97.9 F (36.6 C)-98.4 F (36.9 C)] 97.9 F (36.6 C) (01/26 0600) Pulse Rate:  [62-72] 72 (01/25 2203) Resp:  [15-16] 15 (01/26 0600) BP: (120-142)/(51-80) 128/51 (01/26 0600) SpO2:  [96 %-98 %] 98 % (01/26 0600) Weight change:  Last BM Date: 02/07/18  Intake/Output from previous day: 01/25 0701 - 01/26 0700 In: 240 [P.O.:240] Out: -   PHYSICAL EXAM General appearance: alert, cooperative and no distress Resp: rhonchi bilaterally Cardio: irregularly irregular rhythm GI: soft, non-tender; bowel sounds normal; no masses,  no organomegaly Extremities: extremities normal, atraumatic, no cyanosis or edema  Lab Results:  No results found for this or any previous visit (from the past 48 hour(s)).  ABGS No results for input(s): PHART, PO2ART, TCO2, HCO3 in the last 72 hours.  Invalid input(s): PCO2 CULTURES Recent Results (from the past 240 hour(s))  Blood culture (routine x 2)     Status: None   Collection Time: 02/03/18 10:02 AM  Result Value Ref Range Status   Specimen Description BLOOD LEFT ARM BOTTLES DRAWN AEROBIC AND ANAEROBIC  Final   Special Requests Blood Culture adequate volume  Final   Culture   Final    NO GROWTH 5 DAYS Performed at [facility], [address], [place], [place] [address]    Report Status 02/08/2018 FINAL  Final  Blood culture (routine x 2)     Status: None   Collection Time: 02/03/18 10:17 AM  Result Value Ref Range Status   Specimen Description BLOOD RIGHT HAND BOTTLES DRAWN AEROBIC ONLY  Final   Special Requests Blood Culture adequate volume  Final   Culture   Final    NO GROWTH 5 DAYS Performed at [facility], [address], [place], [place] [address]    Report Status 02/08/2018 FINAL  Final  MRSA PCR Screening     Status: None   Collection Time: 02/03/18  3:22 PM   Result Value Ref Range Status   MRSA by PCR NEGATIVE NEGATIVE Final    Comment:        The GeneXpert MRSA Assay (FDA approved for NASAL specimens only), is one component of a comprehensive MRSA colonization surveillance program. It is not intended to diagnose MRSA infection nor to guide or monitor treatment for MRSA infections. Performed at [facility], [address], [place], [place] [address]   Culture, sputum-assessment     Status: None   Collection Time: 02/03/18  6:04 PM  Result Value Ref Range Status   Specimen Description EXPECTORATED SPUTUM  Final   Special Requests NONE  Final   Sputum evaluation   Final    THIS SPECIMEN IS ACCEPTABLE FOR SPUTUM CULTURE Performed at [facility], [address] [place], [place] [address]    Report Status 02/03/2018 FINAL  Final  Culture, respiratory     Status: None   Collection Time: 02/03/18  6:04 PM  Result Value Ref Range Status   Specimen Description   Final    EXPECTORATED SPUTUM Performed at [facility], [address], [place], [place] [address]    Special Requests   Final    NONE Reflexed from [id number] Performed at [facility], [address], [place], [place] [address]    Gram Stain   Final    ABUNDANT WBC PRESENT, PREDOMINANTLY PMN FEW GRAM POSITIVE COCCI IN  PAIRS IN CLUSTERS RARE GRAM POSITIVE RODS Performed at [facility], [place] [address] [place], [place] [address]    Culture FEW Consistent with normal respiratory flora.  Final   Report Status 02/06/2018 FINAL  Final   Studies/Results: No results found.  Medications:  Prior to Admission:  Medications Prior to Admission  Medication Sig Dispense Refill Last Dose  . acetaminophen (TYLENOL) 500 MG tablet Take 1,000 mg by mouth at bedtime.    02/02/2018  . amLODipine-benazepril (LOTREL) 10-20 MG capsule Take 1 capsule by mouth daily.    02/03/2018 at Unknown time  . conjugated estrogens (PREMARIN) vaginal cream Place 1 Applicatorful vaginally at bedtime.    02/02/2018  . mirabegron ER (MYRBETRIQ) 25 MG TB24 tablet Take 25 mg by mouth daily.   02/03/2018  . sertraline (ZOLOFT) 50 MG tablet Take 50 mg by mouth daily.    02/03/2018 at Unknown time  . traZODone (DESYREL) 50 MG tablet Take 50 mg by mouth at bedtime.   02/02/2018   Scheduled: . metoprolol tartrate  25 mg Oral BID  .  morphine injection  1 mg Intravenous Q6H   Continuous:  CBS:[id number], albuterol, bisacodyl, glycopyrrolate, LORazepam, morphine, morphine injection, ondansetron (ZOFRAN) IV  Assesment: She was admitted with healthcare associated pneumonia.  She was being treated but continued to deteriorate.  She developed atrial fib with rapid ventricular response and some element of heart failure.  She had palliative care consultation and plans were for her to have comfort care but over the last 48 hours she has substantially improved.  She is now awake.  Lost IV access.  Her chest is better. Active Problems:   Essential hypertension, benign   HCAP (healthcare-associated pneumonia)   AKI (acute kidney injury) ([place])   Dehydration   Acute respiratory failure with hypoxia [facility])   Palliative care by specialist   Terminal care   Agitation   Dyspnea    Plan: Transition to oral morphine.  She may be able to go back to her assisted living facility with hospice care in the next 48 hours    LOS: 8 days   Alonza Bogus 02/11/2018, 11:10 AM

## 2018-02-12 DIAGNOSIS — Z7189 Other specified counseling: Secondary | ICD-10-CM

## 2018-02-12 MED ORDER — METOPROLOL TARTRATE 25 MG PO TABS: 25.0000 mg | ORAL_TABLET | Freq: Two times a day (BID) | ORAL | 5 refills | Status: AC

## 2018-02-12 MED ORDER — MORPHINE SULFATE (CONCENTRATE) 10 MG/0.5ML PO SOLN: 2.5000 mg | ORAL | 0 refills | Status: DC | PRN

## 2018-02-12 MED ORDER — LORAZEPAM 0.5 MG PO TABS: 0.5000 mg | ORAL_TABLET | ORAL | 0 refills | Status: AC | PRN

## 2018-02-12 MED ORDER — BISACODYL 10 MG RE SUPP: 10.0000 mg | Freq: Every day | RECTAL | 0 refills | Status: AC | PRN

## 2018-02-12 NOTE — Progress Notes (Signed)
Palliative:   [name] is sitting up in bed with her lunch tray in front of her.  She has eaten maybe one third of her meal.  She makes and mostly keeps eye contact, is calm and cooperative, but oriented to person only.  She has known dementia, but she is able to make her basic needs known.  There is no family at bedside at this time.  Conference with social work related to goals of care, disposition. [name]'s ALF, [facility], is willing to take her back with the benefit of hospice.  Ultimately the best choice would be for [name], if qualified, to be admitted to residential hospice, comfort and dignity at end-of-life, let nature take its course.  There is some concern however about prognosis, see below.  Call to [name], Tally Joe at [phone].  [name] states that [name] does have Medicaid, she has worked with [facility] administration to make sure her mother has this Copy.  We talked about disposition to residential hospice, (I am not sure that she would be accepted), versus return to [facility] with hospice services versus transition to SNF ([facility]), with hospice service, do not rehospitalize.  Early take tells me that she is, "concerned for her comfort".  She tells me that when [name] becomes sick again she should transition to residential hospice, let nature take its course.    [name] states that she is unsure about her mother's ability to return to [facility].  She states that, "she will be very very unhappy if she has to go back with the same roommate".  She tells me that she has tried to have [name]'s roommate changed for a year now.  [name] shares that she is unsure about the best choice for her mother and request that palliative asked her [name] for her preference.  Conference with [name] related to disposition.  Choice offered of return to [facility] versus residential SNF.  She tells me that  she feels she must return to [facility].   Call to Dr. Luan Pulling, no answer to private number, call to main number no VM message left.  Text page and spoke with Dr. Luan Pulling via phone.  He is in agreement for discharge with the benefits of hospice, do not rehospitalize, transition to residential hospice when become sick again.  Prognosis: [name] is having decreased fluid intake and appetite.  She is quite frail and weak.  She seemed to be actively dying a few days ago, and I question if this is a rally.  I would not be surprised if she were to pass away within the next 4 weeks.  Patient and family are requesting comfort and dignity, no aggressive measures, let nature take its course.  65 minutes, extended time Quinn Axe, NP Palliative Medicine Team Team Phone # [phone]  Greater than 50% of this time was spent counseling and coordinating care related to the above assessment and plan.

## 2018-02-12 NOTE — Clinical Social Work Note (Signed)
Per Palliative care nurse rqeuest a referral was made to [facility].   [name] at [facility] advised that patient could return to the facility with Hospice.    [name], Clydene Pugh, LCSW

## 2018-02-12 NOTE — Progress Notes (Signed)
Subjective: She says she feels okay.  No new complaints.  She is still receiving p.o. morphine.  Objective: Vital signs in last 24 hours: Temp:  [97.7 F (36.5 C)-98.1 F (36.7 C)] 98.1 F (36.7 C) (01/27 0557) Pulse Rate:  [63-72] 68 (01/27 0827) Resp:  [14-17] 14 (01/27 0557) BP: (88-142)/(47-71) 92/70 (01/27 0827) SpO2:  [98 %-100 %] 98 % (01/27 0803) Weight change:  Last BM Date: 02/08/18  Intake/Output from previous day: 01/26 0701 - 01/27 0700 In: 360 [P.O.:360] Out: -   PHYSICAL EXAM General appearance: alert, cooperative and no distress Resp: rhonchi bilaterally Cardio: regular rate and rhythm, S1, S2 normal, no murmur, click, rub or gallop GI: soft, non-tender; bowel sounds normal; no masses,  no organomegaly Extremities: extremities normal, atraumatic, no cyanosis or edema  Lab Results:  No results found for this or any previous visit (from the past 48 hour(s)).  ABGS No results for input(s): PHART, PO2ART, TCO2, HCO3 in the last 72 hours.  Invalid input(s): PCO2 CULTURES Recent Results (from the past 240 hour(s))  Blood culture (routine x 2)     Status: None   Collection Time: 02/03/18 10:02 AM  Result Value Ref Range Status   Specimen Description BLOOD LEFT ARM BOTTLES DRAWN AEROBIC AND ANAEROBIC  Final   Special Requests Blood Culture adequate volume  Final   Culture   Final    NO GROWTH 5 DAYS Performed at [facility], [address] [place], [place] [address]    Report Status 02/08/2018 FINAL  Final  Blood culture (routine x 2)     Status: None   Collection Time: 02/03/18 10:17 AM  Result Value Ref Range Status   Specimen Description BLOOD RIGHT HAND BOTTLES DRAWN AEROBIC ONLY  Final   Special Requests Blood Culture adequate volume  Final   Culture   Final    NO GROWTH 5 DAYS Performed at [facility], [address], [place], [place] [address]    Report Status 02/08/2018 FINAL  Final  MRSA PCR Screening     Status: None   Collection Time:  02/03/18  3:22 PM  Result Value Ref Range Status   MRSA by PCR NEGATIVE NEGATIVE Final    Comment:        The GeneXpert MRSA Assay (FDA approved for NASAL specimens only), is one component of a comprehensive MRSA colonization surveillance program. It is not intended to diagnose MRSA infection nor to guide or monitor treatment for MRSA infections. Performed at [facility], [address], [place], [place] [address]   Culture, sputum-assessment     Status: None   Collection Time: 02/03/18  6:04 PM  Result Value Ref Range Status   Specimen Description EXPECTORATED SPUTUM  Final   Special Requests NONE  Final   Sputum evaluation   Final    THIS SPECIMEN IS ACCEPTABLE FOR SPUTUM CULTURE Performed at [facility], [address] [place], [place] [address]    Report Status 02/03/2018 FINAL  Final  Culture, respiratory     Status: None   Collection Time: 02/03/18  6:04 PM  Result Value Ref Range Status   Specimen Description   Final    EXPECTORATED SPUTUM Performed at [facility], [address], [place], [place] [address]    Special Requests   Final    NONE Reflexed from [id number] Performed at [facility], [address] [place], [place] [address]    Gram Stain   Final    ABUNDANT WBC PRESENT, PREDOMINANTLY PMN  FEW GRAM POSITIVE COCCI IN PAIRS IN CLUSTERS RARE GRAM POSITIVE RODS Performed at [facility], [place] [address] [place], [place] [address]    Culture FEW Consistent with normal respiratory flora.  Final   Report Status 02/06/2018 FINAL  Final   Studies/Results: No results found.  Medications:  Prior to Admission:  Medications Prior to Admission  Medication Sig Dispense Refill Last Dose  . acetaminophen (TYLENOL) 500 MG tablet Take 1,000 mg by mouth at bedtime.    02/02/2018  . amLODipine-benazepril (LOTREL) 10-20 MG capsule Take 1 capsule by mouth daily.    02/03/2018 at Unknown time  . conjugated estrogens (PREMARIN) vaginal cream Place 1 Applicatorful  vaginally at bedtime.   02/02/2018  . mirabegron ER (MYRBETRIQ) 25 MG TB24 tablet Take 25 mg by mouth daily.   02/03/2018  . sertraline (ZOLOFT) 50 MG tablet Take 50 mg by mouth daily.    02/03/2018 at Unknown time  . traZODone (DESYREL) 50 MG tablet Take 50 mg by mouth at bedtime.   02/02/2018   Scheduled: . metoprolol tartrate  25 mg Oral BID  .  morphine injection  1 mg Intravenous Q6H   Continuous:  [id number], albuterol, bisacodyl, glycopyrrolate, LORazepam, morphine injection, morphine CONCENTRATE, ondansetron (ZOFRAN) IV  Assesment: She was admitted with healthcare associated pneumonia and it appeared that she was going to die from that.  However she has made remarkable improvement over the last 72 hours.  The question now is what sort of disposition for her. Active Problems:   Essential hypertension, benign   HCAP (healthcare-associated pneumonia)   AKI (acute kidney injury) ([place])   Dehydration   Acute respiratory failure with hypoxia [facility])   Palliative care by specialist   Terminal care   Agitation   Dyspnea    Plan: We will rediscuss with palliative care after they see her today.  I am not sure she is going to be appropriate for inpatient hospice considering how she is doing now but she might be appropriate for outpatient hospice at her assisted living facility    LOS: 9 days   Alonza Bogus 02/12/2018, 8:44 AM

## 2018-02-12 NOTE — Progress Notes (Signed)
No iv access on patient. Wated 1 mg morphine in narcotic disposal system witness by Zenaida Deed RN

## 2018-02-12 NOTE — Progress Notes (Signed)
Nutrition Brief Note  Chart reviewed. Patient triggered on malnutrition screen when re-assessed due to LOS. She has been terminal care since admission and very poor oral intake.  Patient plans to discharge to [facility] with Hospice care.   No further nutrition interventions warranted at this time.    Colman Cater MS,RD,CSG,LDN Office: [phone] Pager: [phone]

## 2018-02-13 NOTE — Discharge Summary (Addendum)
Physician Discharge Summary  Patient ID: [name] MRN: [id number] DOB/AGE: February 03, 1920 85 y.o. Primary Care Physician:Seaborn Nakama, Percell Miller, MD Admit date: 02/03/2018 Discharge date: 02/13/2018    Discharge Diagnoses:   Active Problems:   Essential hypertension, benign   HCAP (healthcare-associated pneumonia)   AKI (acute kidney injury) ([place])   Dehydration   Acute respiratory failure with hypoxia ([place])   Palliative care by specialist   Terminal care   Agitation   Dyspnea   Goals of care, counseling/discussion   Encounter for hospice care discussion Acute heart failure  Allergies as of 02/13/2018      Reactions   Sulfa Antibiotics       Medication List    STOP taking these medications   amLODipine-benazepril 10-20 MG capsule Commonly known as:  LOTREL     TAKE these medications   acetaminophen 500 MG tablet Commonly known as:  TYLENOL Take 1,000 mg by mouth at bedtime.   bisacodyl 10 MG suppository Commonly known as:  DULCOLAX Place 1 suppository (10 mg total) rectally daily as needed for moderate constipation.   conjugated estrogens vaginal cream Commonly known as:  PREMARIN Place 1 Applicatorful vaginally at bedtime.   LORazepam 0.5 MG tablet Commonly known as:  ATIVAN Take 1 tablet (0.5 mg total) by mouth every 4 (four) hours as needed for anxiety or sedation.   metoprolol tartrate 25 MG tablet Commonly known as:  LOPRESSOR Take 1 tablet (25 mg total) by mouth 2 (two) times daily.   morphine CONCENTRATE 10 MG/0.5ML Soln concentrated solution Take 0.13 mLs (2.6 mg total) by mouth every 3 (three) hours as needed for severe pain (Or dyspnea).   MYRBETRIQ 25 MG Tb24 tablet Generic drug:  mirabegron ER Take 25 mg by mouth daily.   sertraline 50 MG tablet Commonly known as:  ZOLOFT Take 50 mg by mouth daily.   traZODone 50 MG tablet Commonly known as:  DESYREL Take 50 mg by mouth at bedtime.       Discharged Condition: Improved    Consults:  Palliative care  Significant Diagnostic Studies: Dg Chest 2 View  Result Date: 02/03/2018 CLINICAL DATA:  Cough EXAM: CHEST - 2 VIEW COMPARISON:  December 17, 2017 FINDINGS: There is airspace consolidation in the posterior left base with small left pleural effusion. The lungs elsewhere are clear. Heart is upper normal in size with pulmonary vascularity normal. No adenopathy. There is aortic atherosclerosis. Bones appear somewhat osteoporotic. IMPRESSION: Airspace consolidation in the posterior left base, felt to represent focal pneumonia. Small left pleural effusion. Lungs elsewhere clear. Stable cardiac silhouette. There is aortic atherosclerosis. Aortic Atherosclerosis (ICD10-I70.0). Followup PA and lateral chest radiographs recommended in 3-4 weeks following trial of antibiotic therapy to ensure resolution and exclude underlying malignancy. Electronically Signed   By: Lowella Grip III M.D.   On: 02/03/2018 10:37   Dg Chest Port 1 View  Result Date: 02/06/2018 CLINICAL DATA:  Pneumonia. EXAM: PORTABLE CHEST 1 VIEW COMPARISON:  Chest x-ray dated February 03, 2018. FINDINGS: Stable cardiomediastinal silhouette. New pulmonary vascular congestion and mild interstitial edema. Progressive hazy and consolidative airspace disease in the bilateral mid to lower lungs. New small right pleural effusion. Unchanged small left pleural effusion. No pneumothorax. No acute osseous abnormality. IMPRESSION: 1. Progressive bilateral airspace disease concerning for worsening multifocal pneumonia, potentially superimposed on a degree of pulmonary alveolar edema given new pulmonary vascular congestion and interstitial edema. 2. Small bilateral pleural effusions. Electronically Signed   By: Titus Dubin M.D.   On: 02/06/2018  10:47    Lab Results: Basic Metabolic Panel: No results for input(s): NA, K, CL, CO2, GLUCOSE, BUN, CREATININE, CALCIUM, MG, PHOS in the last 72 hours. Liver Function Tests: No results for  input(s): AST, ALT, ALKPHOS, BILITOT, PROT, ALBUMIN in the last 72 hours.   CBC: No results for input(s): WBC, NEUTROABS, HGB, HCT, MCV, PLT in the last 72 hours.  Recent Results (from the past 240 hour(s))  Blood culture (routine x 2)     Status: None   Collection Time: 02/03/18 10:02 AM  Result Value Ref Range Status   Specimen Description BLOOD LEFT ARM BOTTLES DRAWN AEROBIC AND ANAEROBIC  Final   Special Requests Blood Culture adequate volume  Final   Culture   Final    NO GROWTH 5 DAYS Performed at [facility], [address] [place], [place] [address]    Report Status 02/08/2018 FINAL  Final  Blood culture (routine x 2)     Status: None   Collection Time: 02/03/18 10:17 AM  Result Value Ref Range Status   Specimen Description BLOOD RIGHT HAND BOTTLES DRAWN AEROBIC ONLY  Final   Special Requests Blood Culture adequate volume  Final   Culture   Final    NO GROWTH 5 DAYS Performed at [facility], [address] [place], [place] [address]    Report Status 02/08/2018 FINAL  Final  MRSA PCR Screening     Status: None   Collection Time: 02/03/18  3:22 PM  Result Value Ref Range Status   MRSA by PCR NEGATIVE NEGATIVE Final    Comment:        The GeneXpert MRSA Assay (FDA approved for NASAL specimens only), is one component of a comprehensive MRSA colonization surveillance program. It is not intended to diagnose MRSA infection nor to guide or monitor treatment for MRSA infections. Performed at [facility], [address], [place], [place] [address]   Culture, sputum-assessment     Status: None   Collection Time: 02/03/18  6:04 PM  Result Value Ref Range Status   Specimen Description EXPECTORATED SPUTUM  Final   Special Requests NONE  Final   Sputum evaluation   Final    THIS SPECIMEN IS ACCEPTABLE FOR SPUTUM CULTURE Performed at [facility], [address], [place], [place] [address]    Report Status 02/03/2018 FINAL  Final  Culture, respiratory     Status: None    Collection Time: 02/03/18  6:04 PM  Result Value Ref Range Status   Specimen Description   Final    EXPECTORATED SPUTUM Performed at [facility], [address] [place], [place] [address]    Special Requests   Final    NONE Reflexed from [phone] Performed at [facility], [address] [place], [place] [address]    Gram Stain   Final    ABUNDANT WBC PRESENT, PREDOMINANTLY PMN FEW GRAM POSITIVE COCCI IN PAIRS IN CLUSTERS RARE GRAM POSITIVE RODS Performed at [facility], [place] [address], [place], [place] [address]    Culture FEW Consistent with normal respiratory flora.  Final   Report Status 02/06/2018 FINAL  Final     Hospital Course: This is a 85 year old who lives at an assisted living facility and who came to the hospital with increasing shortness of breath.  She was found to have healthcare associated pneumonia and was started on treatment.  She generally deteriorated after admission.  She developed atrial fib with rapid ventricular response and what appeared to be heart  failure.  She did not have echocardiogram to evaluate whether this was systolic or diastolic because she had DNR status and after discussion with her daughter we took a more palliative approach.  Palliative care consultation was obtained and initially it looked like she would need to go to a hospice inpatient facility but then she improved.  She is now ready to go back to her assisted living facility with outpatient hospice care.  Discharge Exam: Blood pressure 128/64, pulse 65, temperature (!) 97.5 F (36.4 C), temperature source Oral, resp. rate 16, height 5\' 2"  (1.575 m), weight 45.3 kg, SpO2 97 %. She is awake and alert.  Very hard of hearing.  She is in atrial fib.  Her lungs are clear  Disposition: Back to assisted living facility with home health services.  Plans have been made for her to be discharged on the 28th but her facility could not complete arrangements so she is discharged home today on the  29th   Greater than 30 minutes were pent on this discharge   Signed: Alonza Bogus   02/13/2018, 9:02 AM

## 2018-02-13 NOTE — Care Management Note (Signed)
Case Management Note  Patient Details  Name: [name] MRN: [id number] Date of Birth: 12/10/1920  Subjective/Objective:                 Admitted with pnuemonia. Pt from [facility] ALF. Pt originally with poor prognosis. Seen by Palliative medicine referred to Hospice but found to not have qualifying dx. Pt doing better and will DC back to [facility] with [facility] PT. Facility preference is [facility].    Action/Plan: DC back to ALF with HH PT. Referral given to [facility] rep. CSW has made arrangements for return to facility.   Pt on supplemental oxygen with good saturations. Pt needs to have home O2 assessment. Facility refusing to take pt back today. Home O2 may be set up tomorrow prior to DC if needed.   Expected Discharge Date:  02/13/18               Expected Discharge Plan:  Assisted Living / Rest Home  In-House Referral:  Clinical Social Work, Hospice / Palliative Care  Discharge planning Services  CM Consult  Post Acute Care Choice:  Home Health Choice offered to:  NA  DME Arranged:    DME Agency:     HH Arranged:  PT HH Agency:  [facility]  Status of Service:  Completed, signed off  If discussed at [place] of Stay Meetings, dates discussed:    Additional Comments:  Sherald Barge, RN 02/13/2018, 3:49 PM

## 2018-02-13 NOTE — Care Management Important Message (Signed)
Important Message  Patient Details  Name: [name] MRN: [id number] Date of Birth: Oct 30, 1920   Medicare Important Message Given:  Yes    Sherald Barge, RN 02/13/2018, 3:06 PM

## 2018-02-13 NOTE — Clinical Social Work Note (Signed)
LCSW met with patient and her daughter. LCSW discussed that Hospice would be coming to the hospital to day to meet with them regarding Hospice appropriateness. Daughter agreeable.    Kallie Depolo, Clydene Pugh, LCSW r

## 2018-02-13 NOTE — Progress Notes (Signed)
Subjective: She is awake and alert.  She is sitting up and eating.  No complaints  Objective: Vital signs in last 24 hours: Temp:  [97.5 F (36.4 C)-98.3 F (36.8 C)] 97.5 F (36.4 C) (01/28 0720) Pulse Rate:  [65-75] 65 (01/28 0720) Resp:  [16-20] 16 (01/28 0720) BP: (124-130)/(48-64) 128/64 (01/28 0720) SpO2:  [96 %-99 %] 97 % (01/28 0720) Weight change:  Last BM Date: 02/08/18  Intake/Output from previous day: 01/27 0701 - 01/28 0700 In: 510 [P.O.:510] Out: 1 [Urine:1]  PHYSICAL EXAM General appearance: alert and mild distress Resp: rhonchi bilaterally Cardio: irregularly irregular rhythm GI: soft, non-tender; bowel sounds normal; no masses,  no organomegaly Extremities: extremities normal, atraumatic, no cyanosis or edema  Lab Results:  No results found for this or any previous visit (from the past 48 hour(s)).  ABGS No results for input(s): PHART, PO2ART, TCO2, HCO3 in the last 72 hours.  Invalid input(s): PCO2 CULTURES Recent Results (from the past 240 hour(s))  Blood culture (routine x 2)     Status: None   Collection Time: 02/03/18 10:02 AM  Result Value Ref Range Status   Specimen Description BLOOD LEFT ARM BOTTLES DRAWN AEROBIC AND ANAEROBIC  Final   Special Requests Blood Culture adequate volume  Final   Culture   Final    NO GROWTH 5 DAYS Performed at [facility], [address], [place], [place] [address]    Report Status 02/08/2018 FINAL  Final  Blood culture (routine x 2)     Status: None   Collection Time: 02/03/18 10:17 AM  Result Value Ref Range Status   Specimen Description BLOOD RIGHT HAND BOTTLES DRAWN AEROBIC ONLY  Final   Special Requests Blood Culture adequate volume  Final   Culture   Final    NO GROWTH 5 DAYS Performed at [facility], [address] [place], [place] [address]    Report Status 02/08/2018 FINAL  Final  MRSA PCR Screening     Status: None   Collection Time: 02/03/18  3:22 PM  Result Value Ref Range Status   MRSA by  PCR NEGATIVE NEGATIVE Final    Comment:        The GeneXpert MRSA Assay (FDA approved for NASAL specimens only), is one component of a comprehensive MRSA colonization surveillance program. It is not intended to diagnose MRSA infection nor to guide or monitor treatment for MRSA infections. Performed at [facility], [address], [place], [place] [address]   Culture, sputum-assessment     Status: None   Collection Time: 02/03/18  6:04 PM  Result Value Ref Range Status   Specimen Description EXPECTORATED SPUTUM  Final   Special Requests NONE  Final   Sputum evaluation   Final    THIS SPECIMEN IS ACCEPTABLE FOR SPUTUM CULTURE Performed at [facility], [address], [place], [place] [address]    Report Status 02/03/2018 FINAL  Final  Culture, respiratory     Status: None   Collection Time: 02/03/18  6:04 PM  Result Value Ref Range Status   Specimen Description   Final    EXPECTORATED SPUTUM Performed at [facility], [address] [place], [place] [address]    Special Requests   Final    NONE Reflexed from [phone] [id number] Performed at [facility], [address], [place],  [address]    Gram Stain   Final    ABUNDANT WBC PRESENT, PREDOMINANTLY PMN FEW GRAM POSITIVE COCCI IN PAIRS IN CLUSTERS RARE GRAM POSITIVE RODS  Performed at [facility], [place] [address] [place], [place] [address]    Culture FEW Consistent with normal respiratory flora.  Final   Report Status 02/06/2018 FINAL  Final   Studies/Results: No results found.  Medications:  Prior to Admission:  Medications Prior to Admission  Medication Sig Dispense Refill Last Dose  . acetaminophen (TYLENOL) 500 MG tablet Take 1,000 mg by mouth at bedtime.    02/02/2018  . amLODipine-benazepril (LOTREL) 10-20 MG capsule Take 1 capsule by mouth daily.    02/03/2018 at Unknown time  . conjugated estrogens (PREMARIN) vaginal cream Place 1 Applicatorful vaginally at bedtime.   02/02/2018  . mirabegron ER (MYRBETRIQ) 25  MG TB24 tablet Take 25 mg by mouth daily.   02/03/2018  . sertraline (ZOLOFT) 50 MG tablet Take 50 mg by mouth daily.    02/03/2018 at Unknown time  . traZODone (DESYREL) 50 MG tablet Take 50 mg by mouth at bedtime.   02/02/2018   Scheduled: . metoprolol tartrate  25 mg Oral BID  .  morphine injection  1 mg Intravenous Q6H   Continuous:  IDP:OEUMPNTIRWERX, albuterol, bisacodyl, glycopyrrolate, LORazepam, morphine injection, morphine CONCENTRATE, ondansetron (ZOFRAN) IV  Assesment: She was admitted with healthcare associated pneumonia.  She initially deteriorated rapidly and there was concern that she would have an in-hospital death.  Palliative care was consulted she had DNR status already and plans were for comfort care.  However she improved and is now ready for transfer back to her skilled care facility assuming that we can make arrangements for that.  She will have outpatient hospice.  Generally no plans for readmission.  If she deteriorates she would go to the hospice inpatient facility Active Problems:   Essential hypertension, benign   HCAP (healthcare-associated pneumonia)   AKI (acute kidney injury) ([place])   Dehydration   Acute respiratory failure with hypoxia [facility])   Palliative care by specialist   Terminal care   Agitation   Dyspnea   Goals of care, counseling/discussion   Encounter for hospice care discussion    Plan: Probable discharge today    LOS: 10 days   [name] 02/13/2018, 9:00 AM

## 2018-02-13 NOTE — Care Management Note (Signed)
Case Management Note  Patient Details  Name: [name] MRN: [id number] Date of Birth: 1920/07/20  If discussed at Long Length of Stay Meetings, dates discussed:  02/13/2018  Additional Comments:  Sherald Barge, RN 02/13/2018, 11:46 AM

## 2018-02-14 NOTE — Progress Notes (Signed)
She is awake and alert and in no acute distress.  Plan had been for her to go back to facility with home health but her facility could not bring her back yesterday so she will be discharged today.  She may eventually require hospice care.  She will have home health services

## 2018-02-14 NOTE — Care Management (Signed)
[facility] unable to accept pt d/t payor being OON. Per Butch Penny at [facility] next choice is [facility]. Referral given to [facility], [facility] rep.

## 2018-02-14 NOTE — NC FL2 (Deleted)
[place] MEDICAID FL2 LEVEL OF CARE SCREENING TOOL     IDENTIFICATION  Patient Name: [name] Birthdate: 05-Jul-1920 Sex: female Admission Date (Current Location): 02/03/2018  [facility] and [place] Number:  [employer] and Address:  [place] [address] [place]      Provider Number: [id number]  Attending Physician Name and Address:  Sinda Du, MD  Relative Name and Phone Number:       Current Level of Care: Hospital Recommended Level of Care: [place] Prior Approval Number:    Date Approved/Denied:   PASRR Number:    Discharge Plan: SNF    Current Diagnoses: Patient Active Problem List   Diagnosis Date Noted  . Goals of care, counseling/discussion   . Encounter for hospice care discussion   . Palliative care by specialist   . Terminal care   . Agitation   . Dyspnea   . HCAP (healthcare-associated pneumonia) 02/03/2018  . AKI (acute kidney injury) ([place]) 02/03/2018  . Dehydration 02/03/2018  . Acute respiratory failure with hypoxia ([place]) 02/03/2018  . Pneumonia 12/17/2017  . Altered mental status 07/10/2016  . Urinary incontinence 07/10/2016  . History of CVA (cerebrovascular accident) - by CT scan 07/10/2016  . Compression fracture of L2 lumbar vertebra (HCC) 11/25/2015  . Compression fracture of spine, non-traumatic ([place]) 11/25/2015  . Osteoporosis 11/25/2015  . Peripheral arterial disease ([place]) 11/25/2015  . Pyelonephritis, acute 11/23/2015  . UTI (urinary tract infection) 11/23/2015  . Community acquired pneumonia 07/11/2014  . Urinary tract infection, site not specified 03/01/2012  . Acute delirium 03/01/2012  . Essential hypertension, benign 03/01/2012  . Hypokalemia 03/01/2012    Orientation RESPIRATION BLADDER Height & Weight     Self  Normal(n/a) Incontinent Weight: 99 lb 13.9 oz (45.3 kg) Height:  5\' 2"  (157.5 cm)  BEHAVIORAL SYMPTOMS/MOOD NEUROLOGICAL BOWEL NUTRITION STATUS       Incontinent Diet(heart healty/which the facility states equates to their regular diet. )  AMBULATORY STATUS COMMUNICATION OF NEEDS Skin   Limited Assist Verbally Normal                       Personal Care Assistance Level of Assistance  Bathing, Feeding, Dressing, Total care Bathing Assistance: Limited assistance Feeding assistance: Limited assistance Dressing Assistance: Limited assistance Total Care Assistance: Limited assistance   Functional Limitations Info  Sight, Hearing, Speech Sight Info: Adequate Hearing Info: Adequate Speech Info: Adequate    SPECIAL CARE FACTORS FREQUENCY  PT (By licensed PT)     PT Frequency: 3x/week              Contractures Contractures Info: Not present    Additional Factors Info  Allergies, Code Status Code Status Info: DNR Allergies Info: Sulfa Antibiotics           Current Medications (02/14/2018):  This is the current hospital active medication list Current Facility-Administered Medications  Medication Dose Route Frequency Provider Last Rate Last Dose  . acetaminophen (TYLENOL) tablet 650 mg  650 mg Oral Q6H PRN Kathie Dike, MD   650 mg at 02/12/18 0604  . albuterol (PROVENTIL) (2.5 MG/3ML) 0.083% nebulizer solution 2.5 mg  2.5 mg Nebulization Q6H PRN Sinda Du, MD      . bisacodyl (DULCOLAX) suppository 10 mg  10 mg Rectal Daily PRN Basilio Cairo, NP      . glycopyrrolate (ROBINUL) injection 0.2 mg  0.2 mg Intravenous Q4H PRN Basilio Cairo, NP      .  LORazepam (ATIVAN) tablet 0.5 mg  0.5 mg Oral Q4H PRN Sinda Du, MD   0.5 mg at 02/11/18 1128  . metoprolol tartrate (LOPRESSOR) tablet 25 mg  25 mg Oral BID Sinda Du, MD   25 mg at 02/14/18 4503  . morphine 2 MG/ML injection 1 mg  1 mg Intravenous Q6H Basilio Cairo, NP   1 mg at 02/11/18 0241  . morphine 2 MG/ML injection 2 mg  2 mg Intravenous Q1H PRN Basilio Cairo, NP   2 mg at 02/08/18 1557  . morphine CONCENTRATE 10 MG/0.5ML oral solution 2.6  mg  2.6 mg Oral Q3H PRN Sinda Du, MD   2.6 mg at 02/12/18 1911  . ondansetron (ZOFRAN) injection 4 mg  4 mg Intravenous Q6H PRN Kathie Dike, MD         Discharge Medications: TAKE these medications   acetaminophen 500 MG tablet Commonly known as:  TYLENOL Take 1,000 mg by mouth at bedtime.   bisacodyl 10 MG suppository Commonly known as:  DULCOLAX Place 1 suppository (10 mg total) rectally daily as needed for moderate constipation.   conjugated estrogens vaginal cream Commonly known as:  PREMARIN Place 1 Applicatorful vaginally at bedtime.   LORazepam 0.5 MG tablet Commonly known as:  ATIVAN Take 1 tablet (0.5 mg total) by mouth every 4 (four) hours as needed for anxiety or sedation.   metoprolol tartrate 25 MG tablet Commonly known as:  LOPRESSOR Take 1 tablet (25 mg total) by mouth 2 (two) times daily.   morphine CONCENTRATE 10 MG/0.5ML Soln concentrated solution Take 0.13 mLs (2.6 mg total) by mouth every 3 (three) hours as needed for severe pain (Or dyspnea).   MYRBETRIQ 25 MG Tb24 tablet Generic drug:  mirabegron ER Take 25 mg by mouth daily.   sertraline 50 MG tablet Commonly known as:  ZOLOFT Take 50 mg by mouth daily.   traZODone 50 MG tablet Commonly known as:  DESYREL Take 50 mg by mouth at bedtime.      Relevant Imaging Results:  Relevant Lab Results:   Additional Information SSN [id number] Clydene Pugh, LCSW

## 2018-02-14 NOTE — [place] FL2 (Signed)
[place] MEDICAID FL2 LEVEL OF CARE SCREENING TOOL     IDENTIFICATION  Patient Name: [name] Birthdate: 05/13/1920 Sex: female Admission Date (Current Location): 02/03/2018  [facility] and [place] Number:  [employer] and Address:  [place] [address] [place]      Provider Number: [id number]  Attending Physician Name and Address:  Sinda Du, MD  Relative Name and Phone Number:       Current Level of Care: Hospital Recommended Level of Care: [place] Prior Approval Number:    Date Approved/Denied:   PASRR Number:    Discharge Plan: Domiciliary (Rest home)(ALF)    Current Diagnoses: Patient Active Problem List   Diagnosis Date Noted  . Goals of care, counseling/discussion   . Encounter for hospice care discussion   . Palliative care by specialist   . Terminal care   . Agitation   . Dyspnea   . HCAP (healthcare-associated pneumonia) 02/03/2018  . AKI (acute kidney injury) ([place]) 02/03/2018  . Dehydration 02/03/2018  . Acute respiratory failure with hypoxia ([place]) 02/03/2018  . Pneumonia 12/17/2017  . Altered mental status 07/10/2016  . Urinary incontinence 07/10/2016  . History of CVA (cerebrovascular accident) - by CT scan 07/10/2016  . Compression fracture of L2 lumbar vertebra (HCC) 11/25/2015  . Compression fracture of spine, non-traumatic ([place]) 11/25/2015  . Osteoporosis 11/25/2015  . Peripheral arterial disease ([place]) 11/25/2015  . Pyelonephritis, acute 11/23/2015  . UTI (urinary tract infection) 11/23/2015  . Community acquired pneumonia 07/11/2014  . Urinary tract infection, site not specified 03/01/2012  . Acute delirium 03/01/2012  . Essential hypertension, benign 03/01/2012  . Hypokalemia 03/01/2012    Orientation RESPIRATION BLADDER Height & Weight     Self  Normal(n/a) Incontinent Weight: 99 lb 13.9 oz (45.3 kg) Height:  5\' 2"  (157.5 cm)  BEHAVIORAL SYMPTOMS/MOOD NEUROLOGICAL  BOWEL NUTRITION STATUS      Incontinent Diet(heart healty/which the facility states equates to their regular diet. )  AMBULATORY STATUS COMMUNICATION OF NEEDS Skin   Limited Assist Verbally Normal                       Personal Care Assistance Level of Assistance  Bathing, Feeding, Dressing, Total care Bathing Assistance: Limited assistance Feeding assistance: Limited assistance Dressing Assistance: Limited assistance Total Care Assistance: Limited assistance   Functional Limitations Info  Sight, Hearing, Speech Sight Info: Adequate Hearing Info: Adequate Speech Info: Adequate    SPECIAL CARE FACTORS FREQUENCY  PT (By licensed PT)     PT Frequency: 3x/week              Contractures Contractures Info: Not present    Additional Factors Info  Allergies, Code Status Code Status Info: DNR Allergies Info: Sulfa Antibiotics           Current Medications (02/14/2018):  This is the current hospital active medication list Current Facility-Administered Medications  Medication Dose Route Frequency Provider Last Rate Last Dose  . acetaminophen (TYLENOL) tablet 650 mg  650 mg Oral Q6H PRN Kathie Dike, MD   650 mg at 02/12/18 0604  . albuterol (PROVENTIL) (2.5 MG/3ML) 0.083% nebulizer solution 2.5 mg  2.5 mg Nebulization Q6H PRN Sinda Du, MD      . bisacodyl (DULCOLAX) suppository 10 mg  10 mg Rectal Daily PRN Basilio Cairo, NP      . glycopyrrolate (ROBINUL) injection 0.2 mg  0.2 mg Intravenous Q4H PRN Basilio Cairo,  NP      . LORazepam (ATIVAN) tablet 0.5 mg  0.5 mg Oral Q4H PRN Sinda Du, MD   0.5 mg at 02/11/18 1128  . metoprolol tartrate (LOPRESSOR) tablet 25 mg  25 mg Oral BID Sinda Du, MD   25 mg at 02/14/18 3419  . morphine 2 MG/ML injection 1 mg  1 mg Intravenous Q6H Basilio Cairo, NP   1 mg at 02/11/18 0241  . morphine 2 MG/ML injection 2 mg  2 mg Intravenous Q1H PRN Basilio Cairo, NP   2 mg at 02/08/18 1557  . morphine CONCENTRATE 10  MG/0.5ML oral solution 2.6 mg  2.6 mg Oral Q3H PRN Sinda Du, MD   2.6 mg at 02/12/18 1911  . ondansetron (ZOFRAN) injection 4 mg  4 mg Intravenous Q6H PRN Kathie Dike, MD         Discharge Medications:  TAKE these medications   acetaminophen 500 MG tablet Commonly known as:  TYLENOL Take 1,000 mg by mouth at bedtime.   bisacodyl 10 MG suppository Commonly known as:  DULCOLAX Place 1 suppository (10 mg total) rectally daily as needed for moderate constipation.   conjugated estrogens vaginal cream Commonly known as:  PREMARIN Place 1 Applicatorful vaginally at bedtime.   LORazepam 0.5 MG tablet Commonly known as:  ATIVAN Take 1 tablet (0.5 mg total) by mouth every 4 (four) hours as needed for anxiety or sedation.   metoprolol tartrate 25 MG tablet Commonly known as:  LOPRESSOR Take 1 tablet (25 mg total) by mouth 2 (two) times daily.   morphine CONCENTRATE 10 MG/0.5ML Soln concentrated solution Take 0.13 mLs (2.6 mg total) by mouth every 3 (three) hours as needed for severe pain (Or dyspnea).   MYRBETRIQ 25 MG Tb24 tablet Generic drug:  mirabegron ER Take 25 mg by mouth daily.   sertraline 50 MG tablet Commonly known as:  ZOLOFT Take 50 mg by mouth daily.   traZODone 50 MG tablet Commonly known as:  DESYREL Take 50 mg by mouth at bedtime.       Relevant Imaging Results:  Relevant Lab Results:   Additional Information SSN [id number], Clydene Pugh, LCSW

## 2018-02-14 NOTE — Clinical Social Work Note (Signed)
Patient's daughter was notified of discharge.  Facility was notified of discharge and discharge clinicals sent.   Patient will receive supportive care from [facility].    LCSW signing off.     Phoenyx Paulsen, Clydene Pugh, LCSW

## 2018-02-15 DIAGNOSIS — J189 Pneumonia, unspecified organism: Secondary | ICD-10-CM | POA: Diagnosis not present

## 2018-02-15 DIAGNOSIS — I10 Essential (primary) hypertension: Secondary | ICD-10-CM | POA: Diagnosis not present

## 2018-02-15 DIAGNOSIS — Z8673 Personal history of transient ischemic attack (TIA), and cerebral infarction without residual deficits: Secondary | ICD-10-CM | POA: Diagnosis not present

## 2018-02-15 DIAGNOSIS — F0390 Unspecified dementia without behavioral disturbance: Secondary | ICD-10-CM | POA: Diagnosis not present

## 2018-02-15 DIAGNOSIS — N3281 Overactive bladder: Secondary | ICD-10-CM | POA: Diagnosis not present

## 2018-02-15 DIAGNOSIS — Z79891 Long term (current) use of opiate analgesic: Secondary | ICD-10-CM | POA: Diagnosis not present

## 2018-02-15 DIAGNOSIS — F419 Anxiety disorder, unspecified: Secondary | ICD-10-CM | POA: Diagnosis not present

## 2018-03-27 DIAGNOSIS — M25561 Pain in right knee: Secondary | ICD-10-CM | POA: Diagnosis not present

## 2018-03-27 DIAGNOSIS — M6281 Muscle weakness (generalized): Secondary | ICD-10-CM | POA: Diagnosis not present

## 2018-03-27 DIAGNOSIS — F0390 Unspecified dementia without behavioral disturbance: Secondary | ICD-10-CM | POA: Diagnosis not present

## 2018-03-28 ENCOUNTER — Other Ambulatory Visit: Payer: Self-pay

## 2018-03-28 ENCOUNTER — Emergency Department (HOSPITAL_COMMUNITY): Payer: PPO

## 2018-03-28 ENCOUNTER — Encounter (HOSPITAL_COMMUNITY): Payer: Self-pay | Admitting: Emergency Medicine

## 2018-03-28 ENCOUNTER — Observation Stay (HOSPITAL_COMMUNITY)
Admission: EM | Admit: 2018-03-28 | Discharge: 2018-03-29 | Disposition: A | Discharge status: Skilled Nursing Facility | Payer: PPO | Attending: Pulmonary Disease | Admitting: Pulmonary Disease

## 2018-03-28 DIAGNOSIS — J849 Interstitial pulmonary disease, unspecified: Secondary | ICD-10-CM | POA: Diagnosis present

## 2018-03-28 DIAGNOSIS — Z79899 Other long term (current) drug therapy: Secondary | ICD-10-CM | POA: Diagnosis not present

## 2018-03-28 DIAGNOSIS — I160 Hypertensive urgency: Secondary | ICD-10-CM | POA: Diagnosis not present

## 2018-03-28 DIAGNOSIS — I700 Atherosclerosis of aorta: Secondary | ICD-10-CM | POA: Insufficient documentation

## 2018-03-28 DIAGNOSIS — Z8673 Personal history of transient ischemic attack (TIA), and cerebral infarction without residual deficits: Secondary | ICD-10-CM | POA: Diagnosis not present

## 2018-03-28 DIAGNOSIS — E8770 Fluid overload, unspecified: Secondary | ICD-10-CM

## 2018-03-28 DIAGNOSIS — I10 Essential (primary) hypertension: Secondary | ICD-10-CM | POA: Insufficient documentation

## 2018-03-28 DIAGNOSIS — S79912A Unspecified injury of left hip, initial encounter: Secondary | ICD-10-CM | POA: Diagnosis not present

## 2018-03-28 DIAGNOSIS — M810 Age-related osteoporosis without current pathological fracture: Secondary | ICD-10-CM | POA: Insufficient documentation

## 2018-03-28 DIAGNOSIS — I6529 Occlusion and stenosis of unspecified carotid artery: Secondary | ICD-10-CM | POA: Diagnosis not present

## 2018-03-28 DIAGNOSIS — J90 Pleural effusion, not elsewhere classified: Secondary | ICD-10-CM | POA: Diagnosis not present

## 2018-03-28 DIAGNOSIS — S0003XA Contusion of scalp, initial encounter: Secondary | ICD-10-CM

## 2018-03-28 DIAGNOSIS — S0990XA Unspecified injury of head, initial encounter: Secondary | ICD-10-CM

## 2018-03-28 DIAGNOSIS — Z96641 Presence of right artificial hip joint: Secondary | ICD-10-CM | POA: Insufficient documentation

## 2018-03-28 DIAGNOSIS — R112 Nausea with vomiting, unspecified: Secondary | ICD-10-CM | POA: Diagnosis present

## 2018-03-28 DIAGNOSIS — W050XXA Fall from non-moving wheelchair, initial encounter: Secondary | ICD-10-CM | POA: Insufficient documentation

## 2018-03-28 DIAGNOSIS — S299XXA Unspecified injury of thorax, initial encounter: Secondary | ICD-10-CM | POA: Diagnosis not present

## 2018-03-28 DIAGNOSIS — I252 Old myocardial infarction: Secondary | ICD-10-CM | POA: Insufficient documentation

## 2018-03-28 DIAGNOSIS — W19XXXA Unspecified fall, initial encounter: Secondary | ICD-10-CM

## 2018-03-28 DIAGNOSIS — R0902 Hypoxemia: Principal | ICD-10-CM | POA: Diagnosis present

## 2018-03-28 DIAGNOSIS — I4891 Unspecified atrial fibrillation: Secondary | ICD-10-CM | POA: Diagnosis not present

## 2018-03-28 DIAGNOSIS — F0390 Unspecified dementia without behavioral disturbance: Secondary | ICD-10-CM | POA: Diagnosis not present

## 2018-03-28 DIAGNOSIS — F419 Anxiety disorder, unspecified: Secondary | ICD-10-CM | POA: Diagnosis not present

## 2018-03-28 DIAGNOSIS — S199XXA Unspecified injury of neck, initial encounter: Secondary | ICD-10-CM | POA: Diagnosis not present

## 2018-03-28 LAB — URINALYSIS, ROUTINE W REFLEX MICROSCOPIC
Bilirubin Urine: NEGATIVE
Glucose, UA: NEGATIVE mg/dL
Hgb urine dipstick: NEGATIVE
Ketones, ur: NEGATIVE mg/dL
Nitrite: NEGATIVE
Protein, ur: 100 mg/dL — AB
Specific Gravity, Urine: 1.011 (ref 1.005–1.030)
pH: 6 (ref 5.0–8.0)

## 2018-03-28 LAB — COMPREHENSIVE METABOLIC PANEL
ALT: 15 U/L (ref 0–44)
AST: 23 U/L (ref 15–41)
Albumin: 4.4 g/dL (ref 3.5–5.0)
Alkaline Phosphatase: 76 U/L (ref 38–126)
Anion gap: 9 (ref 5–15)
BUN: 15 mg/dL (ref 8–23)
CHLORIDE: 103 mmol/L (ref 98–111)
CO2: 23 mmol/L (ref 22–32)
Calcium: 8.9 mg/dL (ref 8.9–10.3)
Creatinine, Ser: 0.68 mg/dL (ref 0.44–1.00)
GFR calc Af Amer: >60 mL/min (ref >60)
GFR calc non Af Amer: >60 mL/min (ref >60)
Glucose, Bld: 145 mg/dL — ABNORMAL HIGH (ref 70–99)
Potassium: 3.8 mmol/L (ref 3.5–5.1)
Sodium: 135 mmol/L (ref 135–145)
Total Bilirubin: 1.2 mg/dL (ref 0.3–1.2)
Total Protein: 8 g/dL (ref 6.5–8.1)

## 2018-03-28 LAB — LIPASE, BLOOD: Lipase: 40 U/L (ref 11–51)

## 2018-03-28 LAB — CBC WITH DIFFERENTIAL/PLATELET
Abs Immature Granulocytes: 0.1 10*3/uL — ABNORMAL HIGH (ref 0.00–0.07)
BASOS PCT: 0 %
Basophils Absolute: 0 10*3/uL (ref 0.0–0.1)
Eosinophils Absolute: 0.1 10*3/uL (ref 0.0–0.5)
Eosinophils Relative: 1 %
HCT: 43.1 % (ref 36.0–46.0)
Hemoglobin: 13.7 g/dL (ref 12.0–15.0)
Immature Granulocytes: 1 %
Lymphocytes Relative: 10 %
Lymphs Abs: 1.3 10*3/uL (ref 0.7–4.0)
MCH: 30.4 pg (ref 26.0–34.0)
MCHC: 31.8 g/dL (ref 30.0–36.0)
MCV: 95.8 fL (ref 80.0–100.0)
Monocytes Absolute: 0.8 10*3/uL (ref 0.1–1.0)
Monocytes Relative: 6 %
Neutro Abs: 10.9 10*3/uL — ABNORMAL HIGH (ref 1.7–7.7)
Neutrophils Relative %: 82 %
PLATELETS: 281 10*3/uL (ref 150–400)
RBC: 4.5 MIL/uL (ref 3.87–5.11)
RDW: 14.5 % (ref 11.5–15.5)
WBC: 13.2 10*3/uL — ABNORMAL HIGH (ref 4.0–10.5)
nRBC: 0 % (ref 0.0–0.2)

## 2018-03-28 LAB — TROPONIN I: Troponin I: <0.03 ng/mL (ref <0.03)

## 2018-03-28 LAB — BRAIN NATRIURETIC PEPTIDE: B Natriuretic Peptide: 259 pg/mL — ABNORMAL HIGH (ref 0.0–100.0)

## 2018-03-28 MED ORDER — ESTROGENS, CONJUGATED 0.625 MG/GM VA CREA
1.0000 | TOPICAL_CREAM | Freq: Every day | VAGINAL | Status: DC
  Filled 2018-03-28: qty 30

## 2018-03-28 MED ORDER — MIRABEGRON ER 25 MG PO TB24
25.0000 mg | ORAL_TABLET | Freq: Every morning | ORAL | Status: DC
  Administered 2018-03-29: 25 mg via ORAL
  Filled 2018-03-28 (×2): qty 1

## 2018-03-28 MED ORDER — MORPHINE SULFATE (CONCENTRATE) 10 MG/0.5ML PO SOLN: 2.5000 mg | ORAL | Status: DC | PRN

## 2018-03-28 MED ORDER — SERTRALINE HCL 50 MG PO TABS
50.0000 mg | ORAL_TABLET | Freq: Every morning | ORAL | Status: DC
  Administered 2018-03-29: 50 mg via ORAL
  Filled 2018-03-28: qty 1

## 2018-03-28 MED ORDER — SODIUM CHLORIDE 0.9% FLUSH
3.0000 mL | Freq: Two times a day (BID) | INTRAVENOUS | Status: DC
  Administered 2018-03-29: 3 mL via INTRAVENOUS

## 2018-03-28 MED ORDER — ONDANSETRON HCL 4 MG/2ML IJ SOLN
4.0000 mg | INTRAMUSCULAR | Status: DC | PRN
  Administered 2018-03-28: 4 mg via INTRAVENOUS
  Filled 2018-03-28: qty 2

## 2018-03-28 MED ORDER — METOPROLOL TARTRATE 5 MG/5ML IV SOLN
2.5000 mg | Freq: Once | INTRAVENOUS | Status: AC
  Administered 2018-03-28: 2.5 mg via INTRAVENOUS
  Filled 2018-03-28: qty 5

## 2018-03-28 MED ORDER — METOPROLOL TARTRATE 25 MG PO TABS: 25.0000 mg | ORAL_TABLET | Freq: Once | ORAL | Status: DC

## 2018-03-28 MED ORDER — SODIUM CHLORIDE 0.9% FLUSH: 3.0000 mL | INTRAVENOUS | Status: DC | PRN

## 2018-03-28 MED ORDER — ACETAMINOPHEN 500 MG PO TABS: 1000.0000 mg | ORAL_TABLET | Freq: Every day | ORAL | Status: DC

## 2018-03-28 MED ORDER — LORAZEPAM 2 MG/ML IJ SOLN
0.5000 mg | Freq: Once | INTRAMUSCULAR | Status: AC
  Administered 2018-03-28: 0.5 mg via INTRAVENOUS
  Filled 2018-03-28: qty 1

## 2018-03-28 MED ORDER — SODIUM CHLORIDE 0.9 % IV SOLN: 250.0000 mL | INTRAVENOUS | Status: DC | PRN

## 2018-03-28 MED ORDER — BISACODYL 10 MG RE SUPP: 10.0000 mg | Freq: Every day | RECTAL | Status: DC | PRN

## 2018-03-28 MED ORDER — LORAZEPAM 0.5 MG PO TABS: 0.5000 mg | ORAL_TABLET | ORAL | Status: DC | PRN

## 2018-03-28 MED ORDER — TRAZODONE HCL 50 MG PO TABS: 50.0000 mg | ORAL_TABLET | Freq: Every day | ORAL | Status: DC

## 2018-03-28 MED ORDER — METOPROLOL TARTRATE 25 MG PO TABS
25.0000 mg | ORAL_TABLET | Freq: Two times a day (BID) | ORAL | Status: DC
  Administered 2018-03-29: 25 mg via ORAL
  Filled 2018-03-28: qty 1

## 2018-03-28 NOTE — ED Notes (Signed)
Sats 87-88% on RA. Placed pt on 2L nasal cannula.

## 2018-03-28 NOTE — ED Triage Notes (Addendum)
Patient from [facility], caregiver states she fell in the shower 30 minutes ago hitting the left back side of her head. Denies LOC. Patient alert and denies pain at triage.

## 2018-03-28 NOTE — ED Provider Notes (Signed)
[facility] EMERGENCY DEPARTMENT Provider Note   CSN: [id number] Arrival date & time: 03/28/18  1621    History   Chief Complaint Chief Complaint  Patient presents with   Fall    HPI [name] is a 85 y.o. female.     The history is provided by the patient and the nursing home. The history is limited by the condition of the patient (Hx dementia).  Fall   Pt was seen at 1710. Per NH staff and pt: Pt was getting out of shower and sitting in chair when she fell PTA. Pt fell to the floor onto her left side. Pt "has a knot on her head," per NH staff. Pt was able to stand and ambulate with her walker after the fall. Pt only c/o "I have a knot on my head." No LOC, no syncope. Denies CP/SOB, no abd pain, no N/V/D, no focal motor weakness, no tingling/numbness in extremities. Pt has significant hx of dementia and is currently acting per her baseline.      Past Medical History:  Diagnosis Date   Anxiety    Dementia ([place])    Hypertension    Overactive bladder    Pneumonia    Stroke [facility])     Patient Active Problem List   Diagnosis Date Noted   Goals of care, counseling/discussion    Encounter for hospice care discussion    Palliative care by specialist    Terminal care    Agitation    Dyspnea    HCAP (healthcare-associated pneumonia) 02/03/2018   AKI (acute kidney injury) ([place]) 02/03/2018   Dehydration 02/03/2018   Acute respiratory failure with hypoxia ([place]) 02/03/2018   Pneumonia 12/17/2017   Altered mental status 07/10/2016   Urinary incontinence 07/10/2016   History of CVA (cerebrovascular accident) - by CT scan 07/10/2016   Compression fracture of L2 lumbar vertebra ([place]) 11/25/2015   Compression fracture of spine, non-traumatic ([place]) 11/25/2015   Osteoporosis 11/25/2015   Peripheral arterial disease ([place]) 11/25/2015   Pyelonephritis, acute 11/23/2015   UTI (urinary tract infection) 11/23/2015   Community acquired pneumonia  07/11/2014   Urinary tract infection, site not specified 03/01/2012   Acute delirium 03/01/2012   Essential hypertension, benign 03/01/2012   Hypokalemia 03/01/2012    Past Surgical History:  Procedure Laterality Date   HIP ADDUCTOR TENOTOMY     JOINT REPLACEMENT       OB History   No obstetric history on file.      Home Medications    Prior to Admission medications   Medication Sig Start Date End Date Taking? Authorizing Provider  acetaminophen (TYLENOL) 500 MG tablet Take 1,000 mg by mouth at bedtime.     [provider]  bisacodyl (DULCOLAX) 10 MG suppository Place 1 suppository (10 mg total) rectally daily as needed for moderate constipation. 02/12/18   Sinda Du, MD  conjugated estrogens (PREMARIN) vaginal cream Place 1 Applicatorful vaginally at bedtime.    [provider]  LORazepam (ATIVAN) 0.5 MG tablet Take 1 tablet (0.5 mg total) by mouth every 4 (four) hours as needed for anxiety or sedation. 02/12/18   Sinda Du, MD  metoprolol tartrate (LOPRESSOR) 25 MG tablet Take 1 tablet (25 mg total) by mouth 2 (two) times daily. 02/12/18   Sinda Du, MD  mirabegron ER (MYRBETRIQ) 25 MG TB24 tablet Take 25 mg by mouth daily.    [provider]  Morphine Sulfate (MORPHINE CONCENTRATE) 10 MG/0.5ML SOLN concentrated solution Take 0.13 mLs (2.6  mg total) by mouth every 3 (three) hours as needed for severe pain (Or dyspnea). 02/12/18   Sinda Du, MD  sertraline (ZOLOFT) 50 MG tablet Take 50 mg by mouth daily.  06/24/16   [provider]  traZODone (DESYREL) 50 MG tablet Take 50 mg by mouth at bedtime.    [provider]    Family History History reviewed. No pertinent family history.  Social History Social History   Tobacco Use   Smoking status: Never Smoker   Smokeless tobacco: Never Used  Substance Use Topics   Alcohol use: No   Drug use: No     Allergies   Sulfa antibiotics   Review of  Systems Review of Systems  Unable to perform ROS: Dementia     Physical Exam Updated Vital Signs BP (!) 208/95 (BP Location: Right Arm)    Pulse 74    Temp 97.8 F (36.6 C) (Oral)    Resp 19    SpO2 96%     Patient Vitals for the past 24 hrs:  BP Temp Temp src Pulse Resp SpO2  03/28/18 2030 (!) 173/70 -- -- 67 (!) 26 97 %  03/28/18 1940 -- -- -- -- -- 94 %  03/28/18 1830 (!) 223/109 -- -- 85 16 95 %  03/28/18 1815 -- -- -- 76 -- 90 %  03/28/18 1800 (!) 218/88 -- -- 72 -- 92 %  03/28/18 1750 (!) 222/92 -- -- 75 14 (!) 87 %  03/28/18 1632 (!) 208/95 -- -- -- -- --  03/28/18 1629 -- 97.8 F (36.6 C) Oral 74 19 96 %     Physical Exam 1715: Physical examination: Vital signs and O2 SAT: Reviewed; Constitutional: Well developed, Well nourished, Well hydrated, In no acute distress; Head and Face: Normocephalic, +hematoma left parietal scalp. No open wounds; Eyes: EOMI, PERRL, No scleral icterus; ENMT: Mouth and pharynx normal, Left TM normal, Right TM normal, Mucous membranes moist; Neck: Supple. Trachea midline. No abrasions or ecchymosis.; Spine: No midline CS, TS, LS tenderness.; Cardiovascular: Regular rate and rhythm, No gallop; Respiratory: Breath sounds clear & equal bilaterally, No wheezes, Normal respiratory effort/excursion; Chest: Nontender, No deformity, Movement normal, No crepitus, No abrasions or ecchymosis.; Abdomen: Soft, Nontender, Nondistended, Normal bowel sounds, No abrasions or ecchymosis.; Genitourinary: No CVA tenderness;; Extremities: Full range of motion major/large joints of bilat UE's and LE's without pain or tenderness to palp, Neurovascularly intact, Pulses normal, No deformity. No tenderness, No edema, Pelvis stable; Neuro: Awake, alert, confused per hx dementia. No facial droop. Speech clear. Grips equal. Strength 5/5 equal bilat UE's and LE's. No apparent gross focal motor deficits in extremities.; Skin: Color normal, Warm, Dry   ED Treatments / Results   Labs (all labs ordered are listed, but only abnormal results are displayed)   EKG EKG Interpretation  Date/Time:  Wednesday March 28 2018 19:21:02 EDT Ventricular Rate:  84 PR Interval:    QRS Duration: 107 QT Interval:  407 QTC Calculation: 482 R Axis:   8 Text Interpretation:  Sinus rhythm Prolonged PR interval Anterior infarct, old Borderline ST depression, lateral leads When compared with ECG of 02/03/2018 Normal sinus rhythm has replaced Atrial fibrillation Confirmed by Francine Graven [phone]) on 03/28/2018 7:41:19 PM   Radiology   Procedures Procedures (including critical care time)  Medications Ordered in ED Medications - No data to display   Initial Impression / Assessment and Plan / ED Course  I have reviewed the triage vital signs and the nursing notes.  Pertinent labs & imaging results that were available during my care of the patient were reviewed by me and considered in my medical decision making (see chart for details).     MDM Reviewed: previous chart, nursing note and vitals Reviewed previous: labs and ECG Interpretation: x-ray, CT scan, labs and ECG Total time providing critical care: 30-74 minutes. This excludes time spent performing separately reportable procedures and services. Consults: admitting MD   CRITICAL CARE Performed by: Francine Graven Total critical care time: 35 minutes Critical care time was exclusive of separately billable procedures and treating other patients. Critical care was necessary to treat or prevent imminent or life-threatening deterioration. Critical care was time spent personally by me on the following activities: development of treatment plan with patient and/or surrogate as well as nursing, discussions with consultants, evaluation of patient's response to treatment, examination of patient, obtaining history from patient or surrogate, ordering and performing treatments and interventions, ordering and review of laboratory  studies, ordering and review of radiographic studies, pulse oximetry and re-evaluation of patient's condition.    Results for orders placed or performed during the hospital encounter of 03/28/18  Comprehensive metabolic panel  Result Value Ref Range   Sodium 135 135 - 145 mmol/L   Potassium 3.8 3.5 - 5.1 mmol/L   Chloride 103 98 - 111 mmol/L   CO2 23 22 - 32 mmol/L   Glucose, Bld 145 (H) 70 - 99 mg/dL   BUN 15 8 - 23 mg/dL   Creatinine, Ser 0.68 0.44 - 1.00 mg/dL   Calcium 8.9 8.9 - 10.3 mg/dL   Total Protein 8.0 6.5 - 8.1 g/dL   Albumin 4.4 3.5 - 5.0 g/dL   AST 23 15 - 41 U/L   ALT 15 0 - 44 U/L   Alkaline Phosphatase 76 38 - 126 U/L   Total Bilirubin 1.2 0.3 - 1.2 mg/dL   GFR calc non Af Amer >60 >60 mL/min   GFR calc Af Amer >60 >60 mL/min   Anion gap 9 5 - 15  Lipase, blood  Result Value Ref Range   Lipase 40 11 - 51 U/L  Troponin I - Once  Result Value Ref Range   Troponin I <0.03 <0.03 ng/mL  CBC with Differential  Result Value Ref Range   WBC 13.2 (H) 4.0 - 10.5 K/uL   RBC 4.50 3.87 - 5.11 MIL/uL   Hemoglobin 13.7 12.0 - 15.0 g/dL   HCT 43.1 36.0 - 46.0 %   MCV 95.8 80.0 - 100.0 fL   MCH 30.4 26.0 - 34.0 pg   MCHC 31.8 30.0 - 36.0 g/dL   RDW 14.5 11.5 - 15.5 %   Platelets 281 150 - 400 K/uL   nRBC 0.0 0.0 - 0.2 %   Neutrophils Relative % 82 %   Neutro Abs 10.9 (H) 1.7 - 7.7 K/uL   Lymphocytes Relative 10 %   Lymphs Abs 1.3 0.7 - 4.0 K/uL   Monocytes Relative 6 %   Monocytes Absolute 0.8 0.1 - 1.0 K/uL   Eosinophils Relative 1 %   Eosinophils Absolute 0.1 0.0 - 0.5 K/uL   Basophils Relative 0 %   Basophils Absolute 0.0 0.0 - 0.1 K/uL   Immature Granulocytes 1 %   Abs Immature Granulocytes 0.10 (H) 0.00 - 0.07 K/uL  Urinalysis, Routine w reflex microscopic  Result Value Ref Range   Color, Urine YELLOW YELLOW   APPearance HAZY (A) CLEAR   Specific Gravity, Urine 1.011 1.005 - 1.030  pH 6.0 5.0 - 8.0   Glucose, UA NEGATIVE NEGATIVE mg/dL   Hgb urine  dipstick NEGATIVE NEGATIVE   Bilirubin Urine NEGATIVE NEGATIVE   Ketones, ur NEGATIVE NEGATIVE mg/dL   Protein, ur 100 (A) NEGATIVE mg/dL   Nitrite NEGATIVE NEGATIVE   Leukocytes,Ua TRACE (A) NEGATIVE   RBC / HPF 0-5 0 - 5 RBC/hpf   WBC, UA 11-20 0 - 5 WBC/hpf   Bacteria, UA MANY (A) NONE SEEN   Squamous Epithelial / LPF 0-5 0 - 5   Mucus PRESENT   Brain natriuretic peptide  Result Value Ref Range   B Natriuretic Peptide 259.0 (H) 0.0 - 100.0 pg/mL   Dg Chest 2 View Result Date: 03/28/2018 CLINICAL DATA:  Initial evaluation for acute trauma, fall. EXAM: CHEST - 2 VIEW COMPARISON:  Prior radiograph from 02/06/2018 FINDINGS: Cardiomegaly, stable from previous. Mediastinal silhouette within normal limits. Aortic atherosclerosis. Lungs normally inflated. Chronic coarsening of the interstitial markings. Superimposed mild pulmonary interstitial congestion without frank pulmonary edema. Small bilateral pleural effusions. No consolidative opacity. Minimal bibasilar atelectasis. No pneumothorax. Osteopenia. Chronic compression deformities involving the midthoracic spine with exaggeration of the normal thoracic kyphosis. No definite acute osseous abnormality. IMPRESSION: Cardiomegaly with mild diffuse pulmonary interstitial congestion and small bilateral pleural effusions. Electronically Signed   By: Jeannine Boga M.D.   On: 03/28/2018 19:12   Ct Head Wo Contrast Result Date: 03/28/2018 CLINICAL DATA:  Head trauma EXAM: CT HEAD WITHOUT CONTRAST CT CERVICAL SPINE WITHOUT CONTRAST TECHNIQUE: Multidetector CT imaging of the head and cervical spine was performed following the standard protocol without intravenous contrast. Multiplanar CT image reconstructions of the cervical spine were also generated. COMPARISON:  07/10/2016 FINDINGS: CT HEAD FINDINGS Brain: Chronic encephalomalacia from remote infarcts involving the left anterior inferior frontal lobe, right cerebellum and subcortical bilateral  occipital lobe infarcts. Old left basal ganglial lacunar infarcts are again demonstrated. Moderate degree of small vessel ischemia in the periventricular and subcortical white matter is redemonstrated. No acute intracranial hemorrhage, midline shift or edema. No extra-axial fluid. Midline fourth ventricle and basal cisterns without effacement. Vascular: Atherosclerosis of the skull base. No hyperdense vessel sign. Skull: Intact Sinuses/Orbits: Nonacute with bilateral lens replacements. Other: No significant calvarial soft tissue swelling. CT CERVICAL SPINE FINDINGS Slightly limited by motion artifacts. Alignment: Maintained cervical lordosis. Skull base and vertebrae: No acute cervical spine fracture. Intact skull base. Soft tissues and spinal canal: No prevertebral fluid or swelling. No visible canal hematoma. Disc levels: C2 through C7 degenerative disc flattening and small posterior marginal osteophytes with associated uncovertebral joint osteoarthritis and facet arthropathy. Ankylosed appearance of the right C5-6 facets. No significant central canal stenosis. Mild multilevel foraminal encroachment from osteophytes and uncinate spurring most pronounced on the right at C4-5 Upper chest: No acute pulmonary consolidation or dominant mass. Other: Moderate to marked extracranial carotid arteriosclerosis. IMPRESSION: CT head: 1. Chronic remote infarcts of the left inferior frontal lobe, right cerebellum and subcortical occipital lobes. 2. Chronic moderate degree of small vessel ischemia. Age related involutional changes of the brain are noted. 3. Chronic left basal ganglial lacunar infarcts. 4. No acute intracranial abnormality. CT cervical spine: 1. Cervical spondylosis. 2. No acute cervical spine fracture or static listhesis. Electronically Signed   By: Ashley Royalty M.D.   On: 03/28/2018 19:07   Ct Cervical Spine Wo Contrast Result Date: 03/28/2018 CLINICAL DATA:  Head trauma EXAM: CT HEAD WITHOUT CONTRAST CT  CERVICAL SPINE WITHOUT CONTRAST TECHNIQUE: Multidetector CT imaging of the head and cervical spine  was performed following the standard protocol without intravenous contrast. Multiplanar CT image reconstructions of the cervical spine were also generated. COMPARISON:  07/10/2016 FINDINGS: CT HEAD FINDINGS Brain: Chronic encephalomalacia from remote infarcts involving the left anterior inferior frontal lobe, right cerebellum and subcortical bilateral occipital lobe infarcts. Old left basal ganglial lacunar infarcts are again demonstrated. Moderate degree of small vessel ischemia in the periventricular and subcortical white matter is redemonstrated. No acute intracranial hemorrhage, midline shift or edema. No extra-axial fluid. Midline fourth ventricle and basal cisterns without effacement. Vascular: Atherosclerosis of the skull base. No hyperdense vessel sign. Skull: Intact Sinuses/Orbits: Nonacute with bilateral lens replacements. Other: No significant calvarial soft tissue swelling. CT CERVICAL SPINE FINDINGS Slightly limited by motion artifacts. Alignment: Maintained cervical lordosis. Skull base and vertebrae: No acute cervical spine fracture. Intact skull base. Soft tissues and spinal canal: No prevertebral fluid or swelling. No visible canal hematoma. Disc levels: C2 through C7 degenerative disc flattening and small posterior marginal osteophytes with associated uncovertebral joint osteoarthritis and facet arthropathy. Ankylosed appearance of the right C5-6 facets. No significant central canal stenosis. Mild multilevel foraminal encroachment from osteophytes and uncinate spurring most pronounced on the right at C4-5 Upper chest: No acute pulmonary consolidation or dominant mass. Other: Moderate to marked extracranial carotid arteriosclerosis. IMPRESSION: CT head: 1. Chronic remote infarcts of the left inferior frontal lobe, right cerebellum and subcortical occipital lobes. 2. Chronic moderate degree of small  vessel ischemia. Age related involutional changes of the brain are noted. 3. Chronic left basal ganglial lacunar infarcts. 4. No acute intracranial abnormality. CT cervical spine: 1. Cervical spondylosis. 2. No acute cervical spine fracture or static listhesis. Electronically Signed   By: Ashley Royalty M.D.   On: 03/28/2018 19:07   Dg Hip Unilat With Pelvis 2-3 Views Left  Result Date: 03/28/2018 CLINICAL DATA:  Initial evaluation for acute trauma, fall. EXAM: DG HIP (WITH OR WITHOUT PELVIS) 2-3V LEFT COMPARISON:  None. FINDINGS: Bones are diffusely osteopenic, somewhat limiting evaluation for possible subtle acute nondisplaced fractures. No visible acute fracture dislocation about the left hip. Femoral head in normal alignment within the acetabulum. Femoral head height maintained. Bony pelvis intact. Right total hip arthroplasty partially visualized. Degenerative spondylolysis noted within lower lumbar spine. No acute osseous abnormality. Calcified uterine fibroids overlie the pelvis. Scattered vascular calcifications noted. IMPRESSION: No acute osseous abnormality about the left hip. Electronically Signed   By: Jeannine Boga M.D.   On: 03/28/2018 19:14    2100:  O2 Sats labile since arrival to ED; pt easily desats into mid 80's% R/A. Pt's O2 Sats increase to 94-97% on O2 2L N/C (new O2 requirement). BP continues elevated. Pt with N/V while over in [facility]. BP continues elevated. IV metoprolol given with slow improvement. Concern over new O2 requirement. No family at bedside to clarify goals of care; will observation admit for further BP control and O2 arrangements. No clear UTI on Udip; UC pending. T/C returned from [facility] Shanon Brow, case discussed, including:  HPI, pertinent PM/SHx, VS/PE, dx testing, ED course and treatment:  Agreeable to admit.     Final Clinical Impressions(s) / ED Diagnoses   Final diagnoses:  None    ED Discharge Orders    None       Francine Graven,  DO 03/29/18 2228

## 2018-03-28 NOTE — ED Notes (Signed)
ED Provider at bedside. 

## 2018-03-28 NOTE — ED Notes (Signed)
Pharmacy tech notified for med rec.

## 2018-03-29 DIAGNOSIS — J9601 Acute respiratory failure with hypoxia: Secondary | ICD-10-CM | POA: Diagnosis not present

## 2018-03-29 DIAGNOSIS — R0902 Hypoxemia: Secondary | ICD-10-CM

## 2018-03-29 DIAGNOSIS — E8770 Fluid overload, unspecified: Secondary | ICD-10-CM

## 2018-03-29 DIAGNOSIS — I959 Hypotension, unspecified: Secondary | ICD-10-CM | POA: Diagnosis not present

## 2018-03-29 DIAGNOSIS — J8410 Pulmonary fibrosis, unspecified: Secondary | ICD-10-CM | POA: Diagnosis not present

## 2018-03-29 DIAGNOSIS — J449 Chronic obstructive pulmonary disease, unspecified: Secondary | ICD-10-CM | POA: Diagnosis not present

## 2018-03-29 DIAGNOSIS — J849 Interstitial pulmonary disease, unspecified: Secondary | ICD-10-CM | POA: Diagnosis present

## 2018-03-29 DIAGNOSIS — Z7401 Bed confinement status: Secondary | ICD-10-CM | POA: Diagnosis not present

## 2018-03-29 LAB — MRSA PCR SCREENING: MRSA by PCR: NEGATIVE

## 2018-03-29 MED ORDER — FUROSEMIDE 10 MG/ML IJ SOLN
40.0000 mg | Freq: Once | INTRAMUSCULAR | Status: AC
  Administered 2018-03-29: 40 mg via INTRAVENOUS
  Filled 2018-03-29: qty 4

## 2018-03-29 NOTE — Care Management Obs Status (Signed)
[facility] NOTIFICATION   Patient Details  Name: JALEEYAH MUNCE MRN: [id number] Date of Birth: [birth year]   Medicare Observation Status Notification Given:  Yes    Shade Flood, LCSW 03/29/2018, 12:37 PM

## 2018-03-29 NOTE — Progress Notes (Signed)
Called pt daughter, Tally Joe to give discharge instructions and inform that pt is going to be transported via EMS to [facility].

## 2018-03-29 NOTE — Discharge Summary (Addendum)
Physician Discharge Summary  Patient ID: [name] MRN: [id number] DOB/AGE: 07-20-1920 85 y.o. Primary Care Physician:Arlethia Basso, Percell Miller, MD Admit date: 03/28/2018 Discharge date: 03/29/2018    Discharge Diagnoses:   Principal Problem:   Hypoxia Active Problems:   Essential hypertension, benign   History of CVA (cerebrovascular accident) - by CT scan   Dementia ([place])   Interstitial pulmonary disease (HCC)   Volume overload   Allergies as of 03/29/2018      Reactions   Sulfa Antibiotics    Not listed on MAR provided by Nursing facility      Medication List    STOP taking these medications   morphine CONCENTRATE 10 MG/0.5ML Soln concentrated solution     TAKE these medications   acetaminophen 500 MG tablet Commonly known as:  TYLENOL Take 1,000 mg by mouth at bedtime.   bisacodyl 10 MG suppository Commonly known as:  DULCOLAX Place 1 suppository (10 mg total) rectally daily as needed for moderate constipation.   conjugated estrogens vaginal cream Commonly known as:  PREMARIN Place 1 Applicatorful vaginally at bedtime.   LORazepam 0.5 MG tablet Commonly known as:  ATIVAN Take 1 tablet (0.5 mg total) by mouth every 4 (four) hours as needed for anxiety or sedation.   metoprolol tartrate 25 MG tablet Commonly known as:  LOPRESSOR Take 1 tablet (25 mg total) by mouth 2 (two) times daily.   Myrbetriq 25 MG Tb24 tablet Generic drug:  mirabegron ER Take 25 mg by mouth every morning.   sertraline 50 MG tablet Commonly known as:  ZOLOFT Take 50 mg by mouth every morning.   traZODone 50 MG tablet Commonly known as:  DESYREL Take 50 mg by mouth at bedtime.            Durable Medical Equipment  (From admission, onward)         Start     Ordered   03/29/18 1344  For home use only DME oxygen  Once    Comments:  Liquid oxygen  Question Answer Comment  Mode or (Route) Nasal cannula   Liters per Minute 2   Frequency Continuous (stationary and portable  oxygen unit needed)   Oxygen conserving device Yes   Oxygen delivery system Gas      03/29/18 1344   03/29/18 1341  For home use only DME oxygen  Once    Question Answer Comment  Mode or (Route) Nasal cannula   Liters per Minute 3   Frequency Continuous (stationary and portable oxygen unit needed)   Oxygen conserving device No   Oxygen delivery system Gas      03/29/18 1346   03/29/18 0000  For home use only DME oxygen    Question Answer Comment  Mode or (Route) Nasal cannula   Liters per Minute 3   Frequency Continuous (stationary and portable oxygen unit needed)   Oxygen conserving device No   Oxygen delivery system Gas      03/29/18 1346          Discharged Condition:improved    Consults:none  Significant Diagnostic Studies: Dg Chest 2 View  Result Date: 03/28/2018 CLINICAL DATA:  Initial evaluation for acute trauma, fall. EXAM: CHEST - 2 VIEW COMPARISON:  Prior radiograph from 02/06/2018 FINDINGS: Cardiomegaly, stable from previous. Mediastinal silhouette within normal limits. Aortic atherosclerosis. Lungs normally inflated. Chronic coarsening of the interstitial markings. Superimposed mild pulmonary interstitial congestion without frank pulmonary edema. Small bilateral pleural effusions. No consolidative opacity. Minimal bibasilar atelectasis. No pneumothorax. Osteopenia.  Chronic compression deformities involving the midthoracic spine with exaggeration of the normal thoracic kyphosis. No definite acute osseous abnormality. IMPRESSION: Cardiomegaly with mild diffuse pulmonary interstitial congestion and small bilateral pleural effusions. Electronically Signed   By: Jeannine Boga M.D.   On: 03/28/2018 19:12   Ct Head Wo Contrast  Result Date: 03/28/2018 CLINICAL DATA:  Head trauma EXAM: CT HEAD WITHOUT CONTRAST CT CERVICAL SPINE WITHOUT CONTRAST TECHNIQUE: Multidetector CT imaging of the head and cervical spine was performed following the standard protocol without  intravenous contrast. Multiplanar CT image reconstructions of the cervical spine were also generated. COMPARISON:  07/10/2016 FINDINGS: CT HEAD FINDINGS Brain: Chronic encephalomalacia from remote infarcts involving the left anterior inferior frontal lobe, right cerebellum and subcortical bilateral occipital lobe infarcts. Old left basal ganglial lacunar infarcts are again demonstrated. Moderate degree of small vessel ischemia in the periventricular and subcortical white matter is redemonstrated. No acute intracranial hemorrhage, midline shift or edema. No extra-axial fluid. Midline fourth ventricle and basal cisterns without effacement. Vascular: Atherosclerosis of the skull base. No hyperdense vessel sign. Skull: Intact Sinuses/Orbits: Nonacute with bilateral lens replacements. Other: No significant calvarial soft tissue swelling. CT CERVICAL SPINE FINDINGS Slightly limited by motion artifacts. Alignment: Maintained cervical lordosis. Skull base and vertebrae: No acute cervical spine fracture. Intact skull base. Soft tissues and spinal canal: No prevertebral fluid or swelling. No visible canal hematoma. Disc levels: C2 through C7 degenerative disc flattening and small posterior marginal osteophytes with associated uncovertebral joint osteoarthritis and facet arthropathy. Ankylosed appearance of the right C5-6 facets. No significant central canal stenosis. Mild multilevel foraminal encroachment from osteophytes and uncinate spurring most pronounced on the right at C4-5 Upper chest: No acute pulmonary consolidation or dominant mass. Other: Moderate to marked extracranial carotid arteriosclerosis. IMPRESSION: CT head: 1. Chronic remote infarcts of the left inferior frontal lobe, right cerebellum and subcortical occipital lobes. 2. Chronic moderate degree of small vessel ischemia. Age related involutional changes of the brain are noted. 3. Chronic left basal ganglial lacunar infarcts. 4. No acute intracranial  abnormality. CT cervical spine: 1. Cervical spondylosis. 2. No acute cervical spine fracture or static listhesis. Electronically Signed   By: Ashley Royalty M.D.   On: 03/28/2018 19:07   Ct Cervical Spine Wo Contrast  Result Date: 03/28/2018 CLINICAL DATA:  Head trauma EXAM: CT HEAD WITHOUT CONTRAST CT CERVICAL SPINE WITHOUT CONTRAST TECHNIQUE: Multidetector CT imaging of the head and cervical spine was performed following the standard protocol without intravenous contrast. Multiplanar CT image reconstructions of the cervical spine were also generated. COMPARISON:  07/10/2016 FINDINGS: CT HEAD FINDINGS Brain: Chronic encephalomalacia from remote infarcts involving the left anterior inferior frontal lobe, right cerebellum and subcortical bilateral occipital lobe infarcts. Old left basal ganglial lacunar infarcts are again demonstrated. Moderate degree of small vessel ischemia in the periventricular and subcortical white matter is redemonstrated. No acute intracranial hemorrhage, midline shift or edema. No extra-axial fluid. Midline fourth ventricle and basal cisterns without effacement. Vascular: Atherosclerosis of the skull base. No hyperdense vessel sign. Skull: Intact Sinuses/Orbits: Nonacute with bilateral lens replacements. Other: No significant calvarial soft tissue swelling. CT CERVICAL SPINE FINDINGS Slightly limited by motion artifacts. Alignment: Maintained cervical lordosis. Skull base and vertebrae: No acute cervical spine fracture. Intact skull base. Soft tissues and spinal canal: No prevertebral fluid or swelling. No visible canal hematoma. Disc levels: C2 through C7 degenerative disc flattening and small posterior marginal osteophytes with associated uncovertebral joint osteoarthritis and facet arthropathy. Ankylosed appearance of the right C5-6 facets. No  significant central canal stenosis. Mild multilevel foraminal encroachment from osteophytes and uncinate spurring most pronounced on the right at  C4-5 Upper chest: No acute pulmonary consolidation or dominant mass. Other: Moderate to marked extracranial carotid arteriosclerosis. IMPRESSION: CT head: 1. Chronic remote infarcts of the left inferior frontal lobe, right cerebellum and subcortical occipital lobes. 2. Chronic moderate degree of small vessel ischemia. Age related involutional changes of the brain are noted. 3. Chronic left basal ganglial lacunar infarcts. 4. No acute intracranial abnormality. CT cervical spine: 1. Cervical spondylosis. 2. No acute cervical spine fracture or static listhesis. Electronically Signed   By: Ashley Royalty M.D.   On: 03/28/2018 19:07   Dg Hip Unilat With Pelvis 2-3 Views Left  Result Date: 03/28/2018 CLINICAL DATA:  Initial evaluation for acute trauma, fall. EXAM: DG HIP (WITH OR WITHOUT PELVIS) 2-3V LEFT COMPARISON:  None. FINDINGS: Bones are diffusely osteopenic, somewhat limiting evaluation for possible subtle acute nondisplaced fractures. No visible acute fracture dislocation about the left hip. Femoral head in normal alignment within the acetabulum. Femoral head height maintained. Bony pelvis intact. Right total hip arthroplasty partially visualized. Degenerative spondylolysis noted within lower lumbar spine. No acute osseous abnormality. Calcified uterine fibroids overlie the pelvis. Scattered vascular calcifications noted. IMPRESSION: No acute osseous abnormality about the left hip. Electronically Signed   By: Jeannine Boga M.D.   On: 03/28/2018 19:14    Lab Results: Basic Metabolic Panel: Recent Labs    03/28/18 1936  NA 135  K 3.8  CL 103  CO2 23  GLUCOSE 145*  BUN 15  CREATININE 0.68  CALCIUM 8.9   Liver Function Tests: Recent Labs    03/28/18 1936  AST 23  ALT 15  ALKPHOS 76  BILITOT 1.2  PROT 8.0  ALBUMIN 4.4     CBC: Recent Labs    03/28/18 1936  WBC 13.2*  NEUTROABS 10.9*  HGB 13.7  HCT 43.1  MCV 95.8  PLT 281    Recent Results (from the past 240 hour(s))   MRSA PCR Screening     Status: None   Collection Time: 03/29/18 12:42 AM  Result Value Ref Range Status   MRSA by PCR NEGATIVE NEGATIVE Final    Comment:        The GeneXpert MRSA Assay (FDA approved for NASAL specimens only), is one component of a comprehensive MRSA colonization surveillance program. It is not intended to diagnose MRSA infection nor to guide or monitor treatment for MRSA infections. Performed at [facility], [address], [place], [place] [address]      Hospital Course: she came in after falling from wheelchair at her ALF. She was found to be hypoxic and brought in for observation. She was ready for discharge the next morning and will go home on oxygen.  Discharge Exam: Blood pressure (!) 141/59, pulse (!) 59, temperature 97.8 F (36.6 C), temperature source Oral, resp. rate 14, SpO2 96 %. She s confuse. Hard of hearing. Lungs clear.  Disposition: back to ALF with oxygen  She will be on 2 liters oxygen with liquid oxygen system for portable    Signed: Alonza Bogus   03/29/2018, 1:48 PM

## 2018-03-29 NOTE — Progress Notes (Signed)
SATURATION QUALIFICATIONS: (This note is used to comply with regulatory documentation for home oxygen)  Patient Saturations on Room Air at Rest = 86%  Patient Saturations on Room Air while Ambulating = % unable to ambulate  Patient Saturations on 2 Liters of oxygen while Ambulating = % unable to ambulate  Please briefly explain why patient needs home oxygen: while resting pt saturates at 86% on RA. Pt is currently unable to ambulate, but is able to assist with movement in bed. With 2 L O2 and at rest, pt is able to maintain saturations at 98%

## 2018-03-29 NOTE — Care Management (Addendum)
[employer] will deliver O2 equipment to [employer] today.  Butch Penny at [facility] will call ICU RN and let her know and EMS transport can be arranged at that time.

## 2018-03-29 NOTE — H&P (Signed)
History and Physical    [name] WCB:[id number] DOB: 10/31/1920 DOA: 03/28/2018  PCP: Sinda Du, MD  Patient coming from: Nursing home  Chief Complaint: Fall  HPI: [name] is a 85 y.o. female with medical history significant of dementia, hypertension comes in after falling out of her wheelchair at the nursing home with no loss of consciousness.  Patient was sent for evaluation by nursing home for fall.  Patient has no complaints she is at her baseline status.  She was noted to be hypoxic in the emergency department with O2 sats of 88%.  She is not on oxygen at the nursing home.  Her blood pressure is also elevated with systolics in the 616W.  Dr. Thurnell Garbe in the ED did not feel comfortable with sending her back to the nursing home to resume home home meds and to arrange oxygen as an outpatient.  She is being referred for admission to arrange this at her nursing home in the morning.  Review of Systems: Unobtainable  Past Medical History:  Diagnosis Date   Anxiety    Dementia ([place])    Hypertension    Overactive bladder    Pneumonia    Stroke [facility])     Past Surgical History:  Procedure Laterality Date   HIP ADDUCTOR TENOTOMY     JOINT REPLACEMENT       reports that she has never smoked. She has never used smokeless tobacco. She reports that she does not drink alcohol or use drugs.  Allergies  Allergen Reactions   Sulfa Antibiotics     Not listed on MAR provided by Nursing facility    History reviewed. No pertinent family history.  Unobtainable  Prior to Admission medications   Medication Sig Start Date End Date Taking? Authorizing Provider  acetaminophen (TYLENOL) 500 MG tablet Take 1,000 mg by mouth at bedtime.    Yes [provider]  bisacodyl (DULCOLAX) 10 MG suppository Place 1 suppository (10 mg total) rectally daily as needed for moderate constipation. 02/12/18  Yes Sinda Du, MD  conjugated estrogens (PREMARIN) vaginal cream  Place 1 Applicatorful vaginally at bedtime.   Yes [provider]  LORazepam (ATIVAN) 0.5 MG tablet Take 1 tablet (0.5 mg total) by mouth every 4 (four) hours as needed for anxiety or sedation. 02/12/18  Yes Sinda Du, MD  metoprolol tartrate (LOPRESSOR) 25 MG tablet Take 1 tablet (25 mg total) by mouth 2 (two) times daily. 02/12/18  Yes Sinda Du, MD  mirabegron ER (MYRBETRIQ) 25 MG TB24 tablet Take 25 mg by mouth every morning.    Yes [provider]  Morphine Sulfate (MORPHINE CONCENTRATE) 10 MG/0.5ML SOLN concentrated solution Take 0.13 mLs (2.6 mg total) by mouth every 3 (three) hours as needed for severe pain (Or dyspnea). 02/12/18  Yes Sinda Du, MD  sertraline (ZOLOFT) 50 MG tablet Take 50 mg by mouth every morning.  06/24/16  Yes [provider]  traZODone (DESYREL) 50 MG tablet Take 50 mg by mouth at bedtime.   Yes [provider]    Physical Exam: Vitals:   03/28/18 2100 03/28/18 2130 03/28/18 2300 03/28/18 2335  BP: (!) 169/63 (!) 166/61 (!) 99/47 (!) 128/47  Pulse: 70 70 63 69  Resp: (!) 22 20 16 17   Temp:      TempSrc:      SpO2: 98% 95% 98% 98%      Constitutional: NAD, calm, comfortable Vitals:   03/28/18 2100 03/28/18 2130 03/28/18 2300 03/28/18 2335  BP: (!) 169/63 (!) 166/61 (!) 99/47 (!) 128/47  Pulse: 70 70 63 69  Resp: (!) 22 20 16 17   Temp:      TempSrc:      SpO2: 98% 95% 98% 98%   Eyes: PERRL, lids and conjunctivae normal ENMT: Mucous membranes are moist. Posterior pharynx clear of any exudate or lesions.Normal dentition.  Neck: normal, supple, no masses, no thyromegaly Respiratory: clear to auscultation bilaterally, no wheezing, no crackles. Normal respiratory effort. No accessory muscle use.  Cardiovascular: Regular rate and rhythm, no murmurs / rubs / gallops. No extremity edema. 2+ pedal pulses. No carotid bruits.  Abdomen: no tenderness, no masses palpated. No hepatosplenomegaly. Bowel sounds positive.   Musculoskeletal: no clubbing / cyanosis. No joint deformity upper and lower extremities. Good ROM, no contractures. Normal muscle tone.  Skin: no rashes, lesions, ulcers. No induration Neurologic: CN 2-12 grossly intact. Sensation intact, DTR normal. Strength 5/5 in all 4.  Psychiatric: Normal judgment and insight. Alert and oriented x 3. Normal mood.    Labs on Admission: I have personally reviewed following labs and imaging studies  CBC: Recent Labs  Lab 03/28/18 1936  WBC 13.2*  NEUTROABS 10.9*  HGB 13.7  HCT 43.1  MCV 95.8  PLT 546   Basic Metabolic Panel: Recent Labs  Lab 03/28/18 1936  NA 135  K 3.8  CL 103  CO2 23  GLUCOSE 145*  BUN 15  CREATININE 0.68  CALCIUM 8.9   GFR: CrCl cannot be calculated (Unknown ideal weight.). Liver Function Tests: Recent Labs  Lab 03/28/18 1936  AST 23  ALT 15  ALKPHOS 76  BILITOT 1.2  PROT 8.0  ALBUMIN 4.4   Recent Labs  Lab 03/28/18 1936  LIPASE 40   No results for input(s): AMMONIA in the last 168 hours. Coagulation Profile: No results for input(s): INR, PROTIME in the last 168 hours. Cardiac Enzymes: Recent Labs  Lab 03/28/18 1936  TROPONINI <0.03   BNP (last 3 results) No results for input(s): PROBNP in the last 8760 hours. HbA1C: No results for input(s): HGBA1C in the last 72 hours. CBG: No results for input(s): GLUCAP in the last 168 hours. Lipid Profile: No results for input(s): CHOL, HDL, LDLCALC, TRIG, CHOLHDL, LDLDIRECT in the last 72 hours. Thyroid Function Tests: No results for input(s): TSH, T4TOTAL, FREET4, T3FREE, THYROIDAB in the last 72 hours. Anemia Panel: No results for input(s): VITAMINB12, FOLATE, FERRITIN, TIBC, IRON, RETICCTPCT in the last 72 hours. Urine analysis:    Component Value Date/Time   COLORURINE YELLOW 03/28/2018 2110   APPEARANCEUR HAZY (A) 03/28/2018 2110   LABSPEC 1.011 03/28/2018 2110   PHURINE 6.0 03/28/2018 2110   GLUCOSEU NEGATIVE 03/28/2018 2110   HGBUR  NEGATIVE 03/28/2018 2110   BILIRUBINUR NEGATIVE 03/28/2018 2110   KETONESUR NEGATIVE 03/28/2018 2110   PROTEINUR 100 (A) 03/28/2018 2110   UROBILINOGEN 0.2 02/28/2012 1830   NITRITE NEGATIVE 03/28/2018 2110   LEUKOCYTESUR TRACE (A) 03/28/2018 2110   Sepsis Labs: !!!!!!!!!!!!!!!!!!!!!!!!!!!!!!!!!!!!!!!!!!!! @LABRCNTIP (procalcitonin:4,lacticidven:4) )No results found for this or any previous visit (from the past 240 hour(s)).   Radiological Exams on Admission: Dg Chest 2 View  Result Date: 03/28/2018 CLINICAL DATA:  Initial evaluation for acute trauma, fall. EXAM: CHEST - 2 VIEW COMPARISON:  Prior radiograph from 02/06/2018 FINDINGS: Cardiomegaly, stable from previous. Mediastinal silhouette within normal limits. Aortic atherosclerosis. Lungs normally inflated. Chronic coarsening of the interstitial markings. Superimposed mild pulmonary interstitial congestion without frank pulmonary edema. Small bilateral pleural effusions. No consolidative opacity. Minimal  bibasilar atelectasis. No pneumothorax. Osteopenia. Chronic compression deformities involving the midthoracic spine with exaggeration of the normal thoracic kyphosis. No definite acute osseous abnormality. IMPRESSION: Cardiomegaly with mild diffuse pulmonary interstitial congestion and small bilateral pleural effusions. Electronically Signed   By: Jeannine Boga M.D.   On: 03/28/2018 19:12   Ct Head Wo Contrast  Result Date: 03/28/2018 CLINICAL DATA:  Head trauma EXAM: CT HEAD WITHOUT CONTRAST CT CERVICAL SPINE WITHOUT CONTRAST TECHNIQUE: Multidetector CT imaging of the head and cervical spine was performed following the standard protocol without intravenous contrast. Multiplanar CT image reconstructions of the cervical spine were also generated. COMPARISON:  07/10/2016 FINDINGS: CT HEAD FINDINGS Brain: Chronic encephalomalacia from remote infarcts involving the left anterior inferior frontal lobe, right cerebellum and subcortical  bilateral occipital lobe infarcts. Old left basal ganglial lacunar infarcts are again demonstrated. Moderate degree of small vessel ischemia in the periventricular and subcortical white matter is redemonstrated. No acute intracranial hemorrhage, midline shift or edema. No extra-axial fluid. Midline fourth ventricle and basal cisterns without effacement. Vascular: Atherosclerosis of the skull base. No hyperdense vessel sign. Skull: Intact Sinuses/Orbits: Nonacute with bilateral lens replacements. Other: No significant calvarial soft tissue swelling. CT CERVICAL SPINE FINDINGS Slightly limited by motion artifacts. Alignment: Maintained cervical lordosis. Skull base and vertebrae: No acute cervical spine fracture. Intact skull base. Soft tissues and spinal canal: No prevertebral fluid or swelling. No visible canal hematoma. Disc levels: C2 through C7 degenerative disc flattening and small posterior marginal osteophytes with associated uncovertebral joint osteoarthritis and facet arthropathy. Ankylosed appearance of the right C5-6 facets. No significant central canal stenosis. Mild multilevel foraminal encroachment from osteophytes and uncinate spurring most pronounced on the right at C4-5 Upper chest: No acute pulmonary consolidation or dominant mass. Other: Moderate to marked extracranial carotid arteriosclerosis. IMPRESSION: CT head: 1. Chronic remote infarcts of the left inferior frontal lobe, right cerebellum and subcortical occipital lobes. 2. Chronic moderate degree of small vessel ischemia. Age related involutional changes of the brain are noted. 3. Chronic left basal ganglial lacunar infarcts. 4. No acute intracranial abnormality. CT cervical spine: 1. Cervical spondylosis. 2. No acute cervical spine fracture or static listhesis. Electronically Signed   By: Ashley Royalty M.D.   On: 03/28/2018 19:07   Ct Cervical Spine Wo Contrast  Result Date: 03/28/2018 CLINICAL DATA:  Head trauma EXAM: CT HEAD WITHOUT  CONTRAST CT CERVICAL SPINE WITHOUT CONTRAST TECHNIQUE: Multidetector CT imaging of the head and cervical spine was performed following the standard protocol without intravenous contrast. Multiplanar CT image reconstructions of the cervical spine were also generated. COMPARISON:  07/10/2016 FINDINGS: CT HEAD FINDINGS Brain: Chronic encephalomalacia from remote infarcts involving the left anterior inferior frontal lobe, right cerebellum and subcortical bilateral occipital lobe infarcts. Old left basal ganglial lacunar infarcts are again demonstrated. Moderate degree of small vessel ischemia in the periventricular and subcortical white matter is redemonstrated. No acute intracranial hemorrhage, midline shift or edema. No extra-axial fluid. Midline fourth ventricle and basal cisterns without effacement. Vascular: Atherosclerosis of the skull base. No hyperdense vessel sign. Skull: Intact Sinuses/Orbits: Nonacute with bilateral lens replacements. Other: No significant calvarial soft tissue swelling. CT CERVICAL SPINE FINDINGS Slightly limited by motion artifacts. Alignment: Maintained cervical lordosis. Skull base and vertebrae: No acute cervical spine fracture. Intact skull base. Soft tissues and spinal canal: No prevertebral fluid or swelling. No visible canal hematoma. Disc levels: C2 through C7 degenerative disc flattening and small posterior marginal osteophytes with associated uncovertebral joint osteoarthritis and facet arthropathy. Ankylosed appearance of  the right C5-6 facets. No significant central canal stenosis. Mild multilevel foraminal encroachment from osteophytes and uncinate spurring most pronounced on the right at C4-5 Upper chest: No acute pulmonary consolidation or dominant mass. Other: Moderate to marked extracranial carotid arteriosclerosis. IMPRESSION: CT head: 1. Chronic remote infarcts of the left inferior frontal lobe, right cerebellum and subcortical occipital lobes. 2. Chronic moderate degree  of small vessel ischemia. Age related involutional changes of the brain are noted. 3. Chronic left basal ganglial lacunar infarcts. 4. No acute intracranial abnormality. CT cervical spine: 1. Cervical spondylosis. 2. No acute cervical spine fracture or static listhesis. Electronically Signed   By: Ashley Royalty M.D.   On: 03/28/2018 19:07   Dg Hip Unilat With Pelvis 2-3 Views Left  Result Date: 03/28/2018 CLINICAL DATA:  Initial evaluation for acute trauma, fall. EXAM: DG HIP (WITH OR WITHOUT PELVIS) 2-3V LEFT COMPARISON:  None. FINDINGS: Bones are diffusely osteopenic, somewhat limiting evaluation for possible subtle acute nondisplaced fractures. No visible acute fracture dislocation about the left hip. Femoral head in normal alignment within the acetabulum. Femoral head height maintained. Bony pelvis intact. Right total hip arthroplasty partially visualized. Degenerative spondylolysis noted within lower lumbar spine. No acute osseous abnormality. Calcified uterine fibroids overlie the pelvis. Scattered vascular calcifications noted. IMPRESSION: No acute osseous abnormality about the left hip. Electronically Signed   By: Jeannine Boga M.D.   On: 03/28/2018 19:14     Assessment/Plan 85 year old female with fall with no injury with hypoxia Principal Problem:   Hypoxia-looks like her last discharge this was supposed to be set up at the nursing home per case management notes however it never was set up unclear as to whether or not she had a temporarily and was weaned off.  Case management consult to arrange for oxygen again at her nursing home as needed.  No signs of infection no fever hold off on any antibiotics at this time.  Active Problems:   Essential hypertension, benign-resume home meds   History of CVA (cerebrovascular accident) - by CT scan-noted   Dementia (HCC)-stable   PCP Dr. Luan Pulling.  It is also unclear if hospice was ever arranged.  DVT prophylaxis: SCDs Code Status: DNR Family  Communication: None Disposition Plan: In the morning Consults called: None Admission status: Observation   Denetra Formoso A MD [facility]  If 7PM-7AM, please contact night-coverage [email or web address] Password [facility]  03/29/2018, 12:02 AM

## 2018-03-29 NOTE — Progress Notes (Addendum)
Subjective: She was brought in early this morning after having fallen out of her wheelchair at her assisted living facility.  She did not have loss of consciousness.  She was hypoxic in the emergency department with oxygen saturation in the 80s.  She had an admission earlier this year with what appeared to be aspiration pneumonia and was very sick and appeared terminal but then rallied.  Initially it was thought that she was going to have to go to the hospice inpatient facility but then after she rallied she was sent back to her assisted living facility with plans to consult hospice if she had another deterioration and for no more hospital admissions.  Objective: Vital signs in last 24 hours: Temp:  [97.8 F (36.6 C)] 97.8 F (36.6 C) (03/11 1629) Pulse Rate:  [57-85] 62 (03/12 0700) Resp:  [14-26] 16 (03/12 0700) BP: (99-223)/(47-109) 152/55 (03/12 0600) SpO2:  [87 %-100 %] 99 % (03/12 0700) Weight change:     Intake/Output from previous day: No intake/output data recorded.  PHYSICAL EXAM General appearance: She is awake very hard of hearing mildly confused Resp: rhonchi bilaterally Cardio: regular rate and rhythm, S1, S2 normal, no murmur, click, rub or gallop GI: soft, non-tender; bowel sounds normal; no masses,  no organomegaly Extremities: extremities normal, atraumatic, no cyanosis or edema  Lab Results:  Results for orders placed or performed during the hospital encounter of 03/28/18 (from the past 48 hour(s))  Comprehensive metabolic panel     Status: Abnormal   Collection Time: 03/28/18  7:36 PM  Result Value Ref Range   Sodium 135 135 - 145 mmol/L   Potassium 3.8 3.5 - 5.1 mmol/L   Chloride 103 98 - 111 mmol/L   CO2 23 22 - 32 mmol/L   Glucose, Bld 145 (H) 70 - 99 mg/dL   BUN 15 8 - 23 mg/dL   Creatinine, Ser 0.68 0.44 - 1.00 mg/dL   Calcium 8.9 8.9 - 10.3 mg/dL   Total Protein 8.0 6.5 - 8.1 g/dL   Albumin 4.4 3.5 - 5.0 g/dL   AST 23 15 - 41 U/L   ALT 15 0 - 44 U/L    Alkaline Phosphatase 76 38 - 126 U/L   Total Bilirubin 1.2 0.3 - 1.2 mg/dL   GFR calc non Af Amer >60 >60 mL/min   GFR calc Af Amer >60 >60 mL/min   Anion gap 9 5 - 15    Comment: Performed at [facility], [address], [place], [place] [address]  Lipase, blood     Status: None   Collection Time: 03/28/18  7:36 PM  Result Value Ref Range   Lipase 40 11 - 51 U/L    Comment: Performed at [facility], [address], [place], [place] [address]  Troponin I - Once     Status: None   Collection Time: 03/28/18  7:36 PM  Result Value Ref Range   Troponin I <0.03 <0.03 ng/mL    Comment: Performed at [facility], [address] [place], [place] [address]  CBC with Differential     Status: Abnormal   Collection Time: 03/28/18  7:36 PM  Result Value Ref Range   WBC 13.2 (H) 4.0 - 10.5 K/uL   RBC 4.50 3.87 - 5.11 MIL/uL   Hemoglobin 13.7 12.0 - 15.0 g/dL   HCT 43.1 36.0 - 46.0 %   MCV 95.8 80.0 - 100.0 fL   MCH 30.4 26.0 - 34.0 pg   MCHC 31.8 30.0 -  36.0 g/dL   RDW 14.5 11.5 - 15.5 %   Platelets 281 150 - 400 K/uL   nRBC 0.0 0.0 - 0.2 %   Neutrophils Relative % 82 %   Neutro Abs 10.9 (H) 1.7 - 7.7 K/uL   Lymphocytes Relative 10 %   Lymphs Abs 1.3 0.7 - 4.0 K/uL   Monocytes Relative 6 %   Monocytes Absolute 0.8 0.1 - 1.0 K/uL   Eosinophils Relative 1 %   Eosinophils Absolute 0.1 0.0 - 0.5 K/uL   Basophils Relative 0 %   Basophils Absolute 0.0 0.0 - 0.1 K/uL   Immature Granulocytes 1 %   Abs Immature Granulocytes 0.10 (H) 0.00 - 0.07 K/uL    Comment: Performed at [facility], [address] [place], [place] [address]  Brain natriuretic peptide     Status: Abnormal   Collection Time: 03/28/18  7:36 PM  Result Value Ref Range   B Natriuretic Peptide 259.0 (H) 0.0 - 100.0 pg/mL    Comment: Performed at [facility], [address], [place], [place] [address]  Urinalysis, Routine w reflex microscopic     Status: Abnormal   Collection Time: 03/28/18  9:10 PM  Result Value Ref Range    Color, Urine YELLOW YELLOW   APPearance HAZY (A) CLEAR   Specific Gravity, Urine 1.011 1.005 - 1.030   pH 6.0 5.0 - 8.0   Glucose, UA NEGATIVE NEGATIVE mg/dL   Hgb urine dipstick NEGATIVE NEGATIVE   Bilirubin Urine NEGATIVE NEGATIVE   Ketones, ur NEGATIVE NEGATIVE mg/dL   Protein, ur 100 (A) NEGATIVE mg/dL   Nitrite NEGATIVE NEGATIVE   Leukocytes,Ua TRACE (A) NEGATIVE   RBC / HPF 0-5 0 - 5 RBC/hpf   WBC, UA 11-20 0 - 5 WBC/hpf   Bacteria, UA MANY (A) NONE SEEN   Squamous Epithelial / LPF 0-5 0 - 5   Mucus PRESENT     Comment: Performed at [facility], [address], [place], [place] [address]  MRSA PCR Screening     Status: None   Collection Time: 03/29/18 12:42 AM  Result Value Ref Range   MRSA by PCR NEGATIVE NEGATIVE    Comment:        The GeneXpert MRSA Assay (FDA approved for NASAL specimens only), is one component of a comprehensive MRSA colonization surveillance program. It is not intended to diagnose MRSA infection nor to guide or monitor treatment for MRSA infections. Performed at [facility], [address], [place], [place] [address]     ABGS No results for input(s): PHART, PO2ART, TCO2, HCO3 in the last 72 hours.  Invalid input(s): PCO2 CULTURES Recent Results (from the past 240 hour(s))  MRSA PCR Screening     Status: None   Collection Time: 03/29/18 12:42 AM  Result Value Ref Range Status   MRSA by PCR NEGATIVE NEGATIVE Final    Comment:        The GeneXpert MRSA Assay (FDA approved for NASAL specimens only), is one component of a comprehensive MRSA colonization surveillance program. It is not intended to diagnose MRSA infection nor to guide or monitor treatment for MRSA infections. Performed at [facility], [address] [place], [place] [address]    Studies/Results: Dg Chest 2 View  Result Date: 03/28/2018 CLINICAL DATA:  Initial evaluation for acute trauma, fall. EXAM: CHEST - 2 VIEW COMPARISON:  Prior radiograph from 02/06/2018  FINDINGS: Cardiomegaly, stable from previous. Mediastinal silhouette within normal limits. Aortic atherosclerosis. Lungs normally inflated. Chronic coarsening of  the interstitial markings. Superimposed mild pulmonary interstitial congestion without frank pulmonary edema. Small bilateral pleural effusions. No consolidative opacity. Minimal bibasilar atelectasis. No pneumothorax. Osteopenia. Chronic compression deformities involving the midthoracic spine with exaggeration of the normal thoracic kyphosis. No definite acute osseous abnormality. IMPRESSION: Cardiomegaly with mild diffuse pulmonary interstitial congestion and small bilateral pleural effusions. Electronically Signed   By: Jeannine Boga M.D.   On: 03/28/2018 19:12   Ct Head Wo Contrast  Result Date: 03/28/2018 CLINICAL DATA:  Head trauma EXAM: CT HEAD WITHOUT CONTRAST CT CERVICAL SPINE WITHOUT CONTRAST TECHNIQUE: Multidetector CT imaging of the head and cervical spine was performed following the standard protocol without intravenous contrast. Multiplanar CT image reconstructions of the cervical spine were also generated. COMPARISON:  07/10/2016 FINDINGS: CT HEAD FINDINGS Brain: Chronic encephalomalacia from remote infarcts involving the left anterior inferior frontal lobe, right cerebellum and subcortical bilateral occipital lobe infarcts. Old left basal ganglial lacunar infarcts are again demonstrated. Moderate degree of small vessel ischemia in the periventricular and subcortical white matter is redemonstrated. No acute intracranial hemorrhage, midline shift or edema. No extra-axial fluid. Midline fourth ventricle and basal cisterns without effacement. Vascular: Atherosclerosis of the skull base. No hyperdense vessel sign. Skull: Intact Sinuses/Orbits: Nonacute with bilateral lens replacements. Other: No significant calvarial soft tissue swelling. CT CERVICAL SPINE FINDINGS Slightly limited by motion artifacts. Alignment: Maintained cervical  lordosis. Skull base and vertebrae: No acute cervical spine fracture. Intact skull base. Soft tissues and spinal canal: No prevertebral fluid or swelling. No visible canal hematoma. Disc levels: C2 through C7 degenerative disc flattening and small posterior marginal osteophytes with associated uncovertebral joint osteoarthritis and facet arthropathy. Ankylosed appearance of the right C5-6 facets. No significant central canal stenosis. Mild multilevel foraminal encroachment from osteophytes and uncinate spurring most pronounced on the right at C4-5 Upper chest: No acute pulmonary consolidation or dominant mass. Other: Moderate to marked extracranial carotid arteriosclerosis. IMPRESSION: CT head: 1. Chronic remote infarcts of the left inferior frontal lobe, right cerebellum and subcortical occipital lobes. 2. Chronic moderate degree of small vessel ischemia. Age related involutional changes of the brain are noted. 3. Chronic left basal ganglial lacunar infarcts. 4. No acute intracranial abnormality. CT cervical spine: 1. Cervical spondylosis. 2. No acute cervical spine fracture or static listhesis. Electronically Signed   By: Ashley Royalty M.D.   On: 03/28/2018 19:07   Ct Cervical Spine Wo Contrast  Result Date: 03/28/2018 CLINICAL DATA:  Head trauma EXAM: CT HEAD WITHOUT CONTRAST CT CERVICAL SPINE WITHOUT CONTRAST TECHNIQUE: Multidetector CT imaging of the head and cervical spine was performed following the standard protocol without intravenous contrast. Multiplanar CT image reconstructions of the cervical spine were also generated. COMPARISON:  07/10/2016 FINDINGS: CT HEAD FINDINGS Brain: Chronic encephalomalacia from remote infarcts involving the left anterior inferior frontal lobe, right cerebellum and subcortical bilateral occipital lobe infarcts. Old left basal ganglial lacunar infarcts are again demonstrated. Moderate degree of small vessel ischemia in the periventricular and subcortical white matter is  redemonstrated. No acute intracranial hemorrhage, midline shift or edema. No extra-axial fluid. Midline fourth ventricle and basal cisterns without effacement. Vascular: Atherosclerosis of the skull base. No hyperdense vessel sign. Skull: Intact Sinuses/Orbits: Nonacute with bilateral lens replacements. Other: No significant calvarial soft tissue swelling. CT CERVICAL SPINE FINDINGS Slightly limited by motion artifacts. Alignment: Maintained cervical lordosis. Skull base and vertebrae: No acute cervical spine fracture. Intact skull base. Soft tissues and spinal canal: No prevertebral fluid or swelling. No visible canal hematoma. Disc levels: C2 through  C7 degenerative disc flattening and small posterior marginal osteophytes with associated uncovertebral joint osteoarthritis and facet arthropathy. Ankylosed appearance of the right C5-6 facets. No significant central canal stenosis. Mild multilevel foraminal encroachment from osteophytes and uncinate spurring most pronounced on the right at C4-5 Upper chest: No acute pulmonary consolidation or dominant mass. Other: Moderate to marked extracranial carotid arteriosclerosis. IMPRESSION: CT head: 1. Chronic remote infarcts of the left inferior frontal lobe, right cerebellum and subcortical occipital lobes. 2. Chronic moderate degree of small vessel ischemia. Age related involutional changes of the brain are noted. 3. Chronic left basal ganglial lacunar infarcts. 4. No acute intracranial abnormality. CT cervical spine: 1. Cervical spondylosis. 2. No acute cervical spine fracture or static listhesis. Electronically Signed   By: Ashley Royalty M.D.   On: 03/28/2018 19:07   Dg Hip Unilat With Pelvis 2-3 Views Left  Result Date: 03/28/2018 CLINICAL DATA:  Initial evaluation for acute trauma, fall. EXAM: DG HIP (WITH OR WITHOUT PELVIS) 2-3V LEFT COMPARISON:  None. FINDINGS: Bones are diffusely osteopenic, somewhat limiting evaluation for possible subtle acute nondisplaced  fractures. No visible acute fracture dislocation about the left hip. Femoral head in normal alignment within the acetabulum. Femoral head height maintained. Bony pelvis intact. Right total hip arthroplasty partially visualized. Degenerative spondylolysis noted within lower lumbar spine. No acute osseous abnormality. Calcified uterine fibroids overlie the pelvis. Scattered vascular calcifications noted. IMPRESSION: No acute osseous abnormality about the left hip. Electronically Signed   By: Jeannine Boga M.D.   On: 03/28/2018 19:14    Medications:  Prior to Admission:  Medications Prior to Admission  Medication Sig Dispense Refill Last Dose  . acetaminophen (TYLENOL) 500 MG tablet Take 1,000 mg by mouth at bedtime.    03/27/2018 at Unknown time  . bisacodyl (DULCOLAX) 10 MG suppository Place 1 suppository (10 mg total) rectally daily as needed for moderate constipation. 12 suppository 0 unknown  . conjugated estrogens (PREMARIN) vaginal cream Place 1 Applicatorful vaginally at bedtime.   03/27/2018 at Unknown time  . LORazepam (ATIVAN) 0.5 MG tablet Take 1 tablet (0.5 mg total) by mouth every 4 (four) hours as needed for anxiety or sedation. 30 tablet 0 unknown  . metoprolol tartrate (LOPRESSOR) 25 MG tablet Take 1 tablet (25 mg total) by mouth 2 (two) times daily. 60 tablet 5 03/28/2018 at 800a  . mirabegron ER (MYRBETRIQ) 25 MG TB24 tablet Take 25 mg by mouth every morning.    03/28/2018 at Unknown time  . Morphine Sulfate (MORPHINE CONCENTRATE) 10 MG/0.5ML SOLN concentrated solution Take 0.13 mLs (2.6 mg total) by mouth every 3 (three) hours as needed for severe pain (Or dyspnea). 180 mL 0 unknown  . sertraline (ZOLOFT) 50 MG tablet Take 50 mg by mouth every morning.    03/28/2018 at Unknown time  . traZODone (DESYREL) 50 MG tablet Take 50 mg by mouth at bedtime.   03/27/2018 at Unknown time   Scheduled: . acetaminophen  1,000 mg Oral QHS  . conjugated estrogens  1 Applicatorful Vaginal QHS  .  metoprolol tartrate  25 mg Oral BID  . mirabegron ER  25 mg Oral q morning - 10a  . sertraline  50 mg Oral q morning - 10a  . sodium chloride flush  3 mL Intravenous Q12H  . traZODone  50 mg Oral QHS   Continuous: . sodium chloride     XNA:TFTDDU chloride, bisacodyl, LORazepam, morphine CONCENTRATE, sodium chloride flush  Assesment: She came in with hypoxia with O2 sats in the  80s.  Chest x-ray that I personally reviewed looks like she has chronic interstitial lung disease which is mild and she may have some volume overload  She has dementia at baseline and has had trouble with aspiration.  She appears to be back at her baseline Principal Problem:   Hypoxia Active Problems:   Essential hypertension, benign   History of CVA (cerebrovascular accident) - by CT scan   Dementia [facility])    Plan: Likely back to assisted living facility today.  She will get 1 dose of Lasix she may need oxygen    LOS: 0 days   Alonza Bogus 03/29/2018, 7:28 AM

## 2018-03-29 NOTE — Progress Notes (Signed)
Received call from Butch Penny at [facility]. States O2 has been delivered and they are ready to receive pt. Called [facility] for transportation.

## 2018-03-29 NOTE — NC FL2 (Signed)
[place] MEDICAID FL2 LEVEL OF CARE SCREENING TOOL     IDENTIFICATION  Patient Name: [name] Birthdate: September 09, 1920 Sex: female Admission Date (Current Location): 03/28/2018  [facility] and [place] Number:  [employer] and Address:  [facility] [address] [place]      Provider Number: [id number]  Attending Physician Name and Address:  Sinda Du, MD  Relative Name and Phone Number:       Current Level of Care: Hospital Recommended Level of Care: [place] Prior Approval Number:    Date Approved/Denied:   PASRR Number:    Discharge Plan: Other (Comment)(ALf)    Current Diagnoses: Patient Active Problem List   Diagnosis Date Noted  . Interstitial pulmonary disease ([place]) 03/29/2018  . Volume overload 03/29/2018  . Hypoxia 03/28/2018  . Dementia ([place]) 03/28/2018  . Goals of care, counseling/discussion   . Encounter for hospice care discussion   . Palliative care by specialist   . Terminal care   . Agitation   . Dyspnea   . HCAP (healthcare-associated pneumonia) 02/03/2018  . AKI (acute kidney injury) ([place]) 02/03/2018  . Dehydration 02/03/2018  . Acute respiratory failure with hypoxia ([place]) 02/03/2018  . Pneumonia 12/17/2017  . Altered mental status 07/10/2016  . Urinary incontinence 07/10/2016  . History of CVA (cerebrovascular accident) - by CT scan 07/10/2016  . Compression fracture of L2 lumbar vertebra ([facility]) 11/25/2015  . Compression fracture of spine, non-traumatic ([place]) 11/25/2015  . Osteoporosis 11/25/2015  . Peripheral arterial disease ([place]) 11/25/2015  . Pyelonephritis, acute 11/23/2015  . UTI (urinary tract infection) 11/23/2015  . Community acquired pneumonia 07/11/2014  . Urinary tract infection, site not specified 03/01/2012  . Acute delirium 03/01/2012  . Essential hypertension, benign 03/01/2012  . Hypokalemia 03/01/2012    Orientation RESPIRATION BLADDER Height & Weight      Self, Place  O2(2L) Incontinent Weight:   Height:     BEHAVIORAL SYMPTOMS/MOOD NEUROLOGICAL BOWEL NUTRITION STATUS      Continent Diet(no added table salt)  AMBULATORY STATUS COMMUNICATION OF NEEDS Skin   Limited Assist Verbally Normal                       Personal Care Assistance Level of Assistance  Bathing, Feeding, Dressing Bathing Assistance: Limited assistance Feeding assistance: Independent Dressing Assistance: Limited assistance     Functional Limitations Info  Sight, Hearing, Speech Sight Info: Adequate Hearing Info: Impaired Speech Info: Adequate    SPECIAL CARE FACTORS FREQUENCY                       Contractures Contractures Info: Not present    Additional Factors Info  Code Status, Allergies, Psychotropic Code Status Info: DNR Allergies Info: Sulfa Antibiotics Psychotropic Info: Ativan, Zoloft         Current Medications (03/29/2018):  This is the current hospital active medication list Current Facility-Administered Medications  Medication Dose Route Frequency Provider Last Rate Last Dose  . 0.9 %  sodium chloride infusion  250 mL Intravenous PRN Derrill Kay A, MD      . acetaminophen (TYLENOL) tablet 1,000 mg  1,000 mg Oral QHS Derrill Kay A, MD      . bisacodyl (DULCOLAX) suppository 10 mg  10 mg Rectal Daily PRN Phillips Grout, MD      . conjugated estrogens (PREMARIN) vaginal cream 1 Applicatorful  1 Applicatorful Vaginal QHS Phillips Grout, MD      .  LORazepam (ATIVAN) tablet 0.5 mg  0.5 mg Oral Q4H PRN Derrill Kay A, MD      . metoprolol tartrate (LOPRESSOR) tablet 25 mg  25 mg Oral BID Derrill Kay A, MD   25 mg at 03/29/18 1028  . mirabegron ER (MYRBETRIQ) tablet 25 mg  25 mg Oral q morning - 10a Phillips Grout, MD   25 mg at 03/29/18 1028  . morphine CONCENTRATE 10 MG/0.5ML oral solution 2.6 mg  2.6 mg Oral Q3H PRN Phillips Grout, MD      . sertraline (ZOLOFT) tablet 50 mg  50 mg Oral q morning - 10a Phillips Grout, MD    50 mg at 03/29/18 1028  . sodium chloride flush (NS) 0.9 % injection 3 mL  3 mL Intravenous Q12H Derrill Kay A, MD   3 mL at 03/29/18 1029  . sodium chloride flush (NS) 0.9 % injection 3 mL  3 mL Intravenous PRN Phillips Grout, MD      . traZODone (DESYREL) tablet 50 mg  50 mg Oral QHS Phillips Grout, MD         Discharge Medications: Please see discharge summary for a list of discharge medications.  Relevant Imaging Results:  Relevant Lab Results:   Additional Information    Shade Flood, LCSW

## 2018-03-29 NOTE — TOC Transition Note (Addendum)
Transition of Care [facility]) - CM/SW Discharge Note   Patient Details  Name: [name] MRN: [id number] Date of Birth: 02-03-1920  [facility] CM/SW Contact:  Shade Flood, LCSW Phone Number: 03/29/2018, 1:27 PM   Clinical Narrative: Pt will return to [facility] at dc. Spoke with pt's daughter by phone to update on plan and she is in agreement. Pt needs O2 setup. Awaiting MD orders. [facility] [place] uses [employer] for O2 provider. The O2 equipment needs to be in place before they can pick up pt. Updated RN. DC clinical sent electronically.    Final next level of care: Assisted Living Barriers to Discharge: Other (comment)(Needs O2 setup before she can dc)   Patient Goals and CMS Choice        Discharge Placement                       Discharge Plan and Services                        Social Determinants of Health (SDOH) Interventions     Readmission Risk Interventions No flowsheet data found.

## 2018-03-31 LAB — URINE CULTURE: Culture: >=100000 — AB

## 2018-04-04 DIAGNOSIS — F0390 Unspecified dementia without behavioral disturbance: Secondary | ICD-10-CM | POA: Diagnosis not present

## 2018-04-04 DIAGNOSIS — M6281 Muscle weakness (generalized): Secondary | ICD-10-CM | POA: Diagnosis not present

## 2018-04-04 DIAGNOSIS — M25561 Pain in right knee: Secondary | ICD-10-CM | POA: Diagnosis not present

## 2018-04-05 DIAGNOSIS — M25561 Pain in right knee: Secondary | ICD-10-CM | POA: Diagnosis not present

## 2018-04-05 DIAGNOSIS — F0390 Unspecified dementia without behavioral disturbance: Secondary | ICD-10-CM | POA: Diagnosis not present

## 2018-04-05 DIAGNOSIS — M6281 Muscle weakness (generalized): Secondary | ICD-10-CM | POA: Diagnosis not present

## 2018-04-10 DIAGNOSIS — F0280 Dementia in other diseases classified elsewhere without behavioral disturbance: Secondary | ICD-10-CM | POA: Diagnosis not present

## 2018-04-10 DIAGNOSIS — M25561 Pain in right knee: Secondary | ICD-10-CM | POA: Diagnosis not present

## 2018-04-10 DIAGNOSIS — M6281 Muscle weakness (generalized): Secondary | ICD-10-CM | POA: Diagnosis not present

## 2018-04-10 DIAGNOSIS — F411 Generalized anxiety disorder: Secondary | ICD-10-CM | POA: Diagnosis not present

## 2018-04-10 DIAGNOSIS — F0390 Unspecified dementia without behavioral disturbance: Secondary | ICD-10-CM | POA: Diagnosis not present

## 2018-04-12 DIAGNOSIS — M25561 Pain in right knee: Secondary | ICD-10-CM | POA: Diagnosis not present

## 2018-04-12 DIAGNOSIS — F0390 Unspecified dementia without behavioral disturbance: Secondary | ICD-10-CM | POA: Diagnosis not present

## 2018-04-12 DIAGNOSIS — M6281 Muscle weakness (generalized): Secondary | ICD-10-CM | POA: Diagnosis not present

## 2018-04-13 DIAGNOSIS — F0280 Dementia in other diseases classified elsewhere without behavioral disturbance: Secondary | ICD-10-CM | POA: Diagnosis not present

## 2018-04-13 DIAGNOSIS — F411 Generalized anxiety disorder: Secondary | ICD-10-CM | POA: Diagnosis not present

## 2018-04-17 DIAGNOSIS — M25561 Pain in right knee: Secondary | ICD-10-CM | POA: Diagnosis not present

## 2018-04-17 DIAGNOSIS — F0280 Dementia in other diseases classified elsewhere without behavioral disturbance: Secondary | ICD-10-CM | POA: Diagnosis not present

## 2018-04-17 DIAGNOSIS — M6281 Muscle weakness (generalized): Secondary | ICD-10-CM | POA: Diagnosis not present

## 2018-04-17 DIAGNOSIS — F0390 Unspecified dementia without behavioral disturbance: Secondary | ICD-10-CM | POA: Diagnosis not present

## 2018-04-17 DIAGNOSIS — F411 Generalized anxiety disorder: Secondary | ICD-10-CM | POA: Diagnosis not present

## 2018-04-18 DIAGNOSIS — H2513 Age-related nuclear cataract, bilateral: Secondary | ICD-10-CM | POA: Diagnosis not present

## 2018-04-18 DIAGNOSIS — H53001 Unspecified amblyopia, right eye: Secondary | ICD-10-CM | POA: Diagnosis not present

## 2018-04-18 DIAGNOSIS — F411 Generalized anxiety disorder: Secondary | ICD-10-CM | POA: Diagnosis not present

## 2018-04-18 DIAGNOSIS — I517 Cardiomegaly: Secondary | ICD-10-CM | POA: Diagnosis not present

## 2018-04-18 DIAGNOSIS — H40013 Open angle with borderline findings, low risk, bilateral: Secondary | ICD-10-CM | POA: Diagnosis not present

## 2018-04-18 DIAGNOSIS — F0280 Dementia in other diseases classified elsewhere without behavioral disturbance: Secondary | ICD-10-CM | POA: Diagnosis not present

## 2018-04-19 DIAGNOSIS — R928 Other abnormal and inconclusive findings on diagnostic imaging of breast: Secondary | ICD-10-CM | POA: Diagnosis not present

## 2018-04-19 DIAGNOSIS — R188 Other ascites: Secondary | ICD-10-CM | POA: Diagnosis not present

## 2018-04-19 DIAGNOSIS — M25561 Pain in right knee: Secondary | ICD-10-CM | POA: Diagnosis not present

## 2018-04-19 DIAGNOSIS — M6281 Muscle weakness (generalized): Secondary | ICD-10-CM | POA: Diagnosis not present

## 2018-04-19 DIAGNOSIS — F0390 Unspecified dementia without behavioral disturbance: Secondary | ICD-10-CM | POA: Diagnosis not present

## 2018-04-23 DIAGNOSIS — N400 Enlarged prostate without lower urinary tract symptoms: Secondary | ICD-10-CM | POA: Diagnosis not present

## 2018-04-23 DIAGNOSIS — Z8639 Personal history of other endocrine, nutritional and metabolic disease: Secondary | ICD-10-CM | POA: Diagnosis not present

## 2018-04-23 DIAGNOSIS — F0280 Dementia in other diseases classified elsewhere without behavioral disturbance: Secondary | ICD-10-CM | POA: Diagnosis not present

## 2018-04-23 DIAGNOSIS — F411 Generalized anxiety disorder: Secondary | ICD-10-CM | POA: Diagnosis not present

## 2018-04-23 DIAGNOSIS — E7849 Other hyperlipidemia: Secondary | ICD-10-CM | POA: Diagnosis not present

## 2018-04-23 DIAGNOSIS — E559 Vitamin D deficiency, unspecified: Secondary | ICD-10-CM | POA: Diagnosis not present

## 2018-04-24 DIAGNOSIS — F0390 Unspecified dementia without behavioral disturbance: Secondary | ICD-10-CM | POA: Diagnosis not present

## 2018-04-24 DIAGNOSIS — M25561 Pain in right knee: Secondary | ICD-10-CM | POA: Diagnosis not present

## 2018-04-24 DIAGNOSIS — M546 Pain in thoracic spine: Secondary | ICD-10-CM | POA: Diagnosis not present

## 2018-04-24 DIAGNOSIS — M6281 Muscle weakness (generalized): Secondary | ICD-10-CM | POA: Diagnosis not present

## 2018-04-24 DIAGNOSIS — M4804 Spinal stenosis, thoracic region: Secondary | ICD-10-CM | POA: Diagnosis not present

## 2018-04-25 DIAGNOSIS — D649 Anemia, unspecified: Secondary | ICD-10-CM | POA: Diagnosis not present

## 2018-04-25 DIAGNOSIS — G20 Parkinson's disease: Secondary | ICD-10-CM | POA: Diagnosis not present

## 2018-04-25 DIAGNOSIS — E538 Deficiency of other specified B group vitamins: Secondary | ICD-10-CM | POA: Diagnosis not present

## 2018-04-25 DIAGNOSIS — I10 Essential (primary) hypertension: Secondary | ICD-10-CM | POA: Diagnosis not present

## 2018-04-25 DIAGNOSIS — E039 Hypothyroidism, unspecified: Secondary | ICD-10-CM | POA: Diagnosis not present

## 2018-04-25 DIAGNOSIS — M810 Age-related osteoporosis without current pathological fracture: Secondary | ICD-10-CM | POA: Diagnosis not present

## 2018-04-25 DIAGNOSIS — Z0000 Encounter for general adult medical examination without abnormal findings: Secondary | ICD-10-CM | POA: Diagnosis not present

## 2018-04-25 DIAGNOSIS — N183 Chronic kidney disease, stage 3 (moderate): Secondary | ICD-10-CM | POA: Diagnosis not present

## 2018-04-25 DIAGNOSIS — R2681 Unsteadiness on feet: Secondary | ICD-10-CM | POA: Diagnosis not present

## 2018-04-25 DIAGNOSIS — N184 Chronic kidney disease, stage 4 (severe): Secondary | ICD-10-CM | POA: Diagnosis not present

## 2018-04-25 DIAGNOSIS — E1169 Type 2 diabetes mellitus with other specified complication: Secondary | ICD-10-CM | POA: Diagnosis not present

## 2018-04-25 DIAGNOSIS — G8929 Other chronic pain: Secondary | ICD-10-CM | POA: Diagnosis not present

## 2018-04-25 DIAGNOSIS — Z87891 Personal history of nicotine dependence: Secondary | ICD-10-CM | POA: Diagnosis not present

## 2018-04-25 DIAGNOSIS — M5442 Lumbago with sciatica, left side: Secondary | ICD-10-CM | POA: Diagnosis not present

## 2018-04-25 DIAGNOSIS — E785 Hyperlipidemia, unspecified: Secondary | ICD-10-CM | POA: Diagnosis not present

## 2018-04-25 DIAGNOSIS — K52831 Collagenous colitis: Secondary | ICD-10-CM | POA: Diagnosis not present

## 2018-04-25 DIAGNOSIS — F0280 Dementia in other diseases classified elsewhere without behavioral disturbance: Secondary | ICD-10-CM | POA: Diagnosis not present

## 2018-04-25 DIAGNOSIS — R2689 Other abnormalities of gait and mobility: Secondary | ICD-10-CM | POA: Diagnosis not present

## 2018-04-25 DIAGNOSIS — E8881 Metabolic syndrome: Secondary | ICD-10-CM | POA: Diagnosis not present

## 2018-04-25 DIAGNOSIS — F411 Generalized anxiety disorder: Secondary | ICD-10-CM | POA: Diagnosis not present

## 2018-04-25 DIAGNOSIS — K5904 Chronic idiopathic constipation: Secondary | ICD-10-CM | POA: Diagnosis not present

## 2018-04-25 DIAGNOSIS — E1122 Type 2 diabetes mellitus with diabetic chronic kidney disease: Secondary | ICD-10-CM | POA: Diagnosis not present

## 2018-04-25 DIAGNOSIS — N2581 Secondary hyperparathyroidism of renal origin: Secondary | ICD-10-CM | POA: Diagnosis not present

## 2018-04-25 DIAGNOSIS — M6281 Muscle weakness (generalized): Secondary | ICD-10-CM | POA: Diagnosis not present

## 2018-04-25 DIAGNOSIS — I2510 Atherosclerotic heart disease of native coronary artery without angina pectoris: Secondary | ICD-10-CM | POA: Diagnosis not present

## 2018-04-26 DIAGNOSIS — M6281 Muscle weakness (generalized): Secondary | ICD-10-CM | POA: Diagnosis not present

## 2018-04-26 DIAGNOSIS — M25561 Pain in right knee: Secondary | ICD-10-CM | POA: Diagnosis not present

## 2018-04-26 DIAGNOSIS — F0390 Unspecified dementia without behavioral disturbance: Secondary | ICD-10-CM | POA: Diagnosis not present

## 2018-04-29 DIAGNOSIS — J449 Chronic obstructive pulmonary disease, unspecified: Secondary | ICD-10-CM | POA: Diagnosis not present

## 2018-05-01 DIAGNOSIS — F411 Generalized anxiety disorder: Secondary | ICD-10-CM | POA: Diagnosis not present

## 2018-05-01 DIAGNOSIS — F0390 Unspecified dementia without behavioral disturbance: Secondary | ICD-10-CM | POA: Diagnosis not present

## 2018-05-01 DIAGNOSIS — M6281 Muscle weakness (generalized): Secondary | ICD-10-CM | POA: Diagnosis not present

## 2018-05-01 DIAGNOSIS — F0280 Dementia in other diseases classified elsewhere without behavioral disturbance: Secondary | ICD-10-CM | POA: Diagnosis not present

## 2018-05-01 DIAGNOSIS — M25561 Pain in right knee: Secondary | ICD-10-CM | POA: Diagnosis not present

## 2018-05-03 DIAGNOSIS — M6281 Muscle weakness (generalized): Secondary | ICD-10-CM | POA: Diagnosis not present

## 2018-05-03 DIAGNOSIS — F0390 Unspecified dementia without behavioral disturbance: Secondary | ICD-10-CM | POA: Diagnosis not present

## 2018-05-03 DIAGNOSIS — M25561 Pain in right knee: Secondary | ICD-10-CM | POA: Diagnosis not present

## 2018-05-04 DIAGNOSIS — F0280 Dementia in other diseases classified elsewhere without behavioral disturbance: Secondary | ICD-10-CM | POA: Diagnosis not present

## 2018-05-04 DIAGNOSIS — F411 Generalized anxiety disorder: Secondary | ICD-10-CM | POA: Diagnosis not present

## 2018-05-07 DIAGNOSIS — F411 Generalized anxiety disorder: Secondary | ICD-10-CM | POA: Diagnosis not present

## 2018-05-07 DIAGNOSIS — F0280 Dementia in other diseases classified elsewhere without behavioral disturbance: Secondary | ICD-10-CM | POA: Diagnosis not present

## 2018-05-08 DIAGNOSIS — M6281 Muscle weakness (generalized): Secondary | ICD-10-CM | POA: Diagnosis not present

## 2018-05-08 DIAGNOSIS — M25561 Pain in right knee: Secondary | ICD-10-CM | POA: Diagnosis not present

## 2018-05-08 DIAGNOSIS — F0390 Unspecified dementia without behavioral disturbance: Secondary | ICD-10-CM | POA: Diagnosis not present

## 2018-05-09 DIAGNOSIS — F0280 Dementia in other diseases classified elsewhere without behavioral disturbance: Secondary | ICD-10-CM | POA: Diagnosis not present

## 2018-05-09 DIAGNOSIS — F411 Generalized anxiety disorder: Secondary | ICD-10-CM | POA: Diagnosis not present

## 2018-05-10 DIAGNOSIS — F0390 Unspecified dementia without behavioral disturbance: Secondary | ICD-10-CM | POA: Diagnosis not present

## 2018-05-10 DIAGNOSIS — M6281 Muscle weakness (generalized): Secondary | ICD-10-CM | POA: Diagnosis not present

## 2018-05-10 DIAGNOSIS — M25561 Pain in right knee: Secondary | ICD-10-CM | POA: Diagnosis not present

## 2018-05-14 DIAGNOSIS — F411 Generalized anxiety disorder: Secondary | ICD-10-CM | POA: Diagnosis not present

## 2018-05-14 DIAGNOSIS — F0280 Dementia in other diseases classified elsewhere without behavioral disturbance: Secondary | ICD-10-CM | POA: Diagnosis not present

## 2018-05-15 DIAGNOSIS — F0390 Unspecified dementia without behavioral disturbance: Secondary | ICD-10-CM | POA: Diagnosis not present

## 2018-05-15 DIAGNOSIS — M25561 Pain in right knee: Secondary | ICD-10-CM | POA: Diagnosis not present

## 2018-05-15 DIAGNOSIS — M6281 Muscle weakness (generalized): Secondary | ICD-10-CM | POA: Diagnosis not present

## 2018-05-17 DIAGNOSIS — M6281 Muscle weakness (generalized): Secondary | ICD-10-CM | POA: Diagnosis not present

## 2018-05-17 DIAGNOSIS — F0390 Unspecified dementia without behavioral disturbance: Secondary | ICD-10-CM | POA: Diagnosis not present

## 2018-05-17 DIAGNOSIS — M25561 Pain in right knee: Secondary | ICD-10-CM | POA: Diagnosis not present

## 2018-05-21 DIAGNOSIS — F411 Generalized anxiety disorder: Secondary | ICD-10-CM | POA: Diagnosis not present

## 2018-05-21 DIAGNOSIS — F0280 Dementia in other diseases classified elsewhere without behavioral disturbance: Secondary | ICD-10-CM | POA: Diagnosis not present

## 2018-05-22 DIAGNOSIS — F0390 Unspecified dementia without behavioral disturbance: Secondary | ICD-10-CM | POA: Diagnosis not present

## 2018-05-22 DIAGNOSIS — M25561 Pain in right knee: Secondary | ICD-10-CM | POA: Diagnosis not present

## 2018-05-22 DIAGNOSIS — M6281 Muscle weakness (generalized): Secondary | ICD-10-CM | POA: Diagnosis not present

## 2018-05-23 DIAGNOSIS — F411 Generalized anxiety disorder: Secondary | ICD-10-CM | POA: Diagnosis not present

## 2018-05-23 DIAGNOSIS — F0280 Dementia in other diseases classified elsewhere without behavioral disturbance: Secondary | ICD-10-CM | POA: Diagnosis not present

## 2018-05-24 DIAGNOSIS — M25561 Pain in right knee: Secondary | ICD-10-CM | POA: Diagnosis not present

## 2018-05-24 DIAGNOSIS — F0390 Unspecified dementia without behavioral disturbance: Secondary | ICD-10-CM | POA: Diagnosis not present

## 2018-05-24 DIAGNOSIS — M6281 Muscle weakness (generalized): Secondary | ICD-10-CM | POA: Diagnosis not present

## 2018-05-29 ENCOUNTER — Emergency Department (HOSPITAL_COMMUNITY): Payer: PPO

## 2018-05-29 ENCOUNTER — Inpatient Hospital Stay (HOSPITAL_COMMUNITY)
Admission: EM | Admit: 2018-05-29 | Discharge: 2018-06-04 | DRG: 392 | Disposition: A | Discharge status: Nursing Home (Includes ICF) | Payer: PPO | Source: Skilled Nursing Facility | Attending: Pulmonary Disease | Admitting: Pulmonary Disease

## 2018-05-29 ENCOUNTER — Other Ambulatory Visit: Payer: Self-pay

## 2018-05-29 ENCOUNTER — Encounter (HOSPITAL_COMMUNITY): Payer: Self-pay | Admitting: Emergency Medicine

## 2018-05-29 DIAGNOSIS — R778 Other specified abnormalities of plasma proteins: Secondary | ICD-10-CM

## 2018-05-29 DIAGNOSIS — R112 Nausea with vomiting, unspecified: Secondary | ICD-10-CM | POA: Diagnosis not present

## 2018-05-29 DIAGNOSIS — R1310 Dysphagia, unspecified: Secondary | ICD-10-CM | POA: Diagnosis not present

## 2018-05-29 DIAGNOSIS — F411 Generalized anxiety disorder: Secondary | ICD-10-CM | POA: Diagnosis not present

## 2018-05-29 DIAGNOSIS — I517 Cardiomegaly: Secondary | ICD-10-CM | POA: Diagnosis not present

## 2018-05-29 DIAGNOSIS — R1111 Vomiting without nausea: Secondary | ICD-10-CM | POA: Diagnosis not present

## 2018-05-29 DIAGNOSIS — E876 Hypokalemia: Secondary | ICD-10-CM | POA: Diagnosis present

## 2018-05-29 DIAGNOSIS — Z8673 Personal history of transient ischemic attack (TIA), and cerebral infarction without residual deficits: Secondary | ICD-10-CM | POA: Diagnosis not present

## 2018-05-29 DIAGNOSIS — Z96641 Presence of right artificial hip joint: Secondary | ICD-10-CM | POA: Diagnosis present

## 2018-05-29 DIAGNOSIS — J8410 Pulmonary fibrosis, unspecified: Secondary | ICD-10-CM | POA: Diagnosis present

## 2018-05-29 DIAGNOSIS — F329 Major depressive disorder, single episode, unspecified: Secondary | ICD-10-CM | POA: Diagnosis not present

## 2018-05-29 DIAGNOSIS — R7989 Other specified abnormal findings of blood chemistry: Secondary | ICD-10-CM | POA: Diagnosis not present

## 2018-05-29 DIAGNOSIS — K529 Noninfective gastroenteritis and colitis, unspecified: Principal | ICD-10-CM

## 2018-05-29 DIAGNOSIS — R790 Abnormal level of blood mineral: Secondary | ICD-10-CM | POA: Diagnosis not present

## 2018-05-29 DIAGNOSIS — Z79899 Other long term (current) drug therapy: Secondary | ICD-10-CM | POA: Diagnosis not present

## 2018-05-29 DIAGNOSIS — J449 Chronic obstructive pulmonary disease, unspecified: Secondary | ICD-10-CM | POA: Diagnosis not present

## 2018-05-29 DIAGNOSIS — E1165 Type 2 diabetes mellitus with hyperglycemia: Secondary | ICD-10-CM | POA: Diagnosis not present

## 2018-05-29 DIAGNOSIS — J9601 Acute respiratory failure with hypoxia: Secondary | ICD-10-CM | POA: Diagnosis not present

## 2018-05-29 DIAGNOSIS — N3281 Overactive bladder: Secondary | ICD-10-CM | POA: Diagnosis not present

## 2018-05-29 DIAGNOSIS — L8990 Pressure ulcer of unspecified site, unspecified stage: Secondary | ICD-10-CM

## 2018-05-29 DIAGNOSIS — R1110 Vomiting, unspecified: Secondary | ICD-10-CM | POA: Diagnosis not present

## 2018-05-29 DIAGNOSIS — I10 Essential (primary) hypertension: Secondary | ICD-10-CM | POA: Diagnosis present

## 2018-05-29 DIAGNOSIS — Z66 Do not resuscitate: Secondary | ICD-10-CM | POA: Diagnosis not present

## 2018-05-29 DIAGNOSIS — Z03818 Encounter for observation for suspected exposure to other biological agents ruled out: Secondary | ICD-10-CM | POA: Diagnosis not present

## 2018-05-29 DIAGNOSIS — H9190 Unspecified hearing loss, unspecified ear: Secondary | ICD-10-CM | POA: Diagnosis present

## 2018-05-29 DIAGNOSIS — Z1159 Encounter for screening for other viral diseases: Secondary | ICD-10-CM

## 2018-05-29 DIAGNOSIS — L89150 Pressure ulcer of sacral region, unstageable: Secondary | ICD-10-CM | POA: Diagnosis present

## 2018-05-29 DIAGNOSIS — F0390 Unspecified dementia without behavioral disturbance: Secondary | ICD-10-CM | POA: Diagnosis not present

## 2018-05-29 DIAGNOSIS — I160 Hypertensive urgency: Secondary | ICD-10-CM | POA: Diagnosis present

## 2018-05-29 DIAGNOSIS — Z882 Allergy status to sulfonamides status: Secondary | ICD-10-CM

## 2018-05-29 DIAGNOSIS — K5641 Fecal impaction: Secondary | ICD-10-CM | POA: Diagnosis not present

## 2018-05-29 DIAGNOSIS — R4182 Altered mental status, unspecified: Secondary | ICD-10-CM | POA: Diagnosis not present

## 2018-05-29 DIAGNOSIS — K920 Hematemesis: Secondary | ICD-10-CM | POA: Diagnosis not present

## 2018-05-29 DIAGNOSIS — F0280 Dementia in other diseases classified elsewhere without behavioral disturbance: Secondary | ICD-10-CM | POA: Diagnosis not present

## 2018-05-29 DIAGNOSIS — M810 Age-related osteoporosis without current pathological fracture: Secondary | ICD-10-CM | POA: Diagnosis present

## 2018-05-29 LAB — URINALYSIS, ROUTINE W REFLEX MICROSCOPIC
Bilirubin Urine: NEGATIVE
Glucose, UA: 50 mg/dL — AB
Ketones, ur: NEGATIVE mg/dL
Leukocytes,Ua: NEGATIVE
Nitrite: NEGATIVE
Protein, ur: 100 mg/dL — AB
Specific Gravity, Urine: 1.009 (ref 1.005–1.030)
pH: 8 (ref 5.0–8.0)

## 2018-05-29 LAB — COMPREHENSIVE METABOLIC PANEL
ALT: 20 U/L (ref 0–44)
AST: 28 U/L (ref 15–41)
Albumin: 4.3 g/dL (ref 3.5–5.0)
Alkaline Phosphatase: 102 U/L (ref 38–126)
Anion gap: 12 (ref 5–15)
BUN: 19 mg/dL (ref 8–23)
CO2: 25 mmol/L (ref 22–32)
Calcium: 8.6 mg/dL — ABNORMAL LOW (ref 8.9–10.3)
Chloride: 101 mmol/L (ref 98–111)
Creatinine, Ser: 0.83 mg/dL (ref 0.44–1.00)
GFR calc Af Amer: >60 mL/min (ref >60)
GFR calc non Af Amer: 60 mL/min — ABNORMAL LOW (ref >60)
Glucose, Bld: 170 mg/dL — ABNORMAL HIGH (ref 70–99)
Potassium: 3.8 mmol/L (ref 3.5–5.1)
Sodium: 138 mmol/L (ref 135–145)
Total Bilirubin: 1.9 mg/dL — ABNORMAL HIGH (ref 0.3–1.2)
Total Protein: 8.1 g/dL (ref 6.5–8.1)

## 2018-05-29 LAB — CBC WITH DIFFERENTIAL/PLATELET
Abs Immature Granulocytes: 0.07 10*3/uL (ref 0.00–0.07)
Basophils Absolute: 0 10*3/uL (ref 0.0–0.1)
Basophils Relative: 0 %
Eosinophils Absolute: 0 10*3/uL (ref 0.0–0.5)
Eosinophils Relative: 0 %
HCT: 44.2 % (ref 36.0–46.0)
Hemoglobin: 14.2 g/dL (ref 12.0–15.0)
Immature Granulocytes: 1 %
Lymphocytes Relative: 3 %
Lymphs Abs: 0.5 10*3/uL — ABNORMAL LOW (ref 0.7–4.0)
MCH: 30.7 pg (ref 26.0–34.0)
MCHC: 32.1 g/dL (ref 30.0–36.0)
MCV: 95.5 fL (ref 80.0–100.0)
Monocytes Absolute: 0.8 10*3/uL (ref 0.1–1.0)
Monocytes Relative: 5 %
Neutro Abs: 13.8 10*3/uL — ABNORMAL HIGH (ref 1.7–7.7)
Neutrophils Relative %: 91 %
Platelets: 216 10*3/uL (ref 150–400)
RBC: 4.63 MIL/uL (ref 3.87–5.11)
RDW: 13.2 % (ref 11.5–15.5)
WBC: 15.2 10*3/uL — ABNORMAL HIGH (ref 4.0–10.5)
nRBC: 0 % (ref 0.0–0.2)

## 2018-05-29 LAB — TROPONIN I
Troponin I: 0.04 ng/mL (ref <0.03)
Troponin I: 0.12 ng/mL (ref <0.03)

## 2018-05-29 LAB — MRSA PCR SCREENING: MRSA by PCR: NEGATIVE

## 2018-05-29 LAB — SARS CORONAVIRUS 2 BY RT PCR (HOSPITAL ORDER, PERFORMED IN ~~LOC~~ HOSPITAL LAB): SARS Coronavirus 2: NEGATIVE

## 2018-05-29 LAB — MAGNESIUM: Magnesium: 1.8 mg/dL (ref 1.7–2.4)

## 2018-05-29 LAB — LIPASE, BLOOD: Lipase: 28 U/L (ref 11–51)

## 2018-05-29 MED ORDER — CIPROFLOXACIN IN D5W 400 MG/200ML IV SOLN
400.0000 mg | INTRAVENOUS | Status: DC
  Administered 2018-05-29: 400 mg via INTRAVENOUS
  Filled 2018-05-29: qty 200

## 2018-05-29 MED ORDER — ESTROGENS, CONJUGATED 0.625 MG/GM VA CREA
1.0000 | TOPICAL_CREAM | Freq: Every day | VAGINAL | Status: DC
  Administered 2018-05-30 – 2018-06-03 (×5): 1 via VAGINAL
  Filled 2018-05-29: qty 30

## 2018-05-29 MED ORDER — ACETAMINOPHEN 325 MG PO TABS: 650.0000 mg | ORAL_TABLET | Freq: Four times a day (QID) | ORAL | Status: DC | PRN

## 2018-05-29 MED ORDER — SODIUM CHLORIDE 0.9 % IV SOLN
2.0000 g | INTRAVENOUS | Status: DC
  Administered 2018-05-29 – 2018-06-02 (×5): 2 g via INTRAVENOUS
  Filled 2018-05-29 (×5): qty 20

## 2018-05-29 MED ORDER — METOPROLOL TARTRATE 25 MG PO TABS
25.0000 mg | ORAL_TABLET | Freq: Two times a day (BID) | ORAL | Status: DC
  Administered 2018-05-29 – 2018-06-04 (×11): 25 mg via ORAL
  Filled 2018-05-29 (×12): qty 1

## 2018-05-29 MED ORDER — METRONIDAZOLE IN NACL 5-0.79 MG/ML-% IV SOLN
500.0000 mg | Freq: Three times a day (TID) | INTRAVENOUS | Status: DC
  Administered 2018-05-29: 500 mg via INTRAVENOUS
  Filled 2018-05-29: qty 100

## 2018-05-29 MED ORDER — ONDANSETRON HCL 4 MG PO TABS: 4.0000 mg | ORAL_TABLET | Freq: Four times a day (QID) | ORAL | Status: DC | PRN

## 2018-05-29 MED ORDER — METRONIDAZOLE IN NACL 5-0.79 MG/ML-% IV SOLN
500.0000 mg | Freq: Three times a day (TID) | INTRAVENOUS | Status: DC
  Administered 2018-05-29 – 2018-06-03 (×14): 500 mg via INTRAVENOUS
  Filled 2018-05-29 (×14): qty 100

## 2018-05-29 MED ORDER — POLYETHYLENE GLYCOL 3350 17 G PO PACK: 17.0000 g | PACK | Freq: Every day | ORAL | Status: DC | PRN

## 2018-05-29 MED ORDER — ENOXAPARIN SODIUM 30 MG/0.3ML ~~LOC~~ SOLN
30.0000 mg | SUBCUTANEOUS | Status: DC
  Administered 2018-05-29 – 2018-06-03 (×6): 30 mg via SUBCUTANEOUS
  Filled 2018-05-29 (×6): qty 0.3

## 2018-05-29 MED ORDER — SODIUM CHLORIDE 0.9 % IV SOLN
INTRAVENOUS | Status: DC
  Administered 2018-05-29 (×2): via INTRAVENOUS

## 2018-05-29 MED ORDER — MIRABEGRON ER 25 MG PO TB24
25.0000 mg | ORAL_TABLET | Freq: Every morning | ORAL | Status: DC
  Administered 2018-05-31 – 2018-06-04 (×5): 25 mg via ORAL
  Filled 2018-05-29 (×6): qty 1

## 2018-05-29 MED ORDER — SERTRALINE HCL 50 MG PO TABS
50.0000 mg | ORAL_TABLET | Freq: Every morning | ORAL | Status: DC
  Administered 2018-05-31 – 2018-06-04 (×5): 50 mg via ORAL
  Filled 2018-05-29 (×6): qty 1

## 2018-05-29 MED ORDER — BENAZEPRIL HCL 10 MG PO TABS
20.0000 mg | ORAL_TABLET | Freq: Every day | ORAL | Status: DC
  Administered 2018-05-29 – 2018-06-04 (×6): 20 mg via ORAL
  Filled 2018-05-29 (×7): qty 2

## 2018-05-29 MED ORDER — ONDANSETRON HCL 4 MG/2ML IJ SOLN
4.0000 mg | Freq: Once | INTRAMUSCULAR | Status: AC
  Administered 2018-05-29: 08:00:00 4 mg via INTRAVENOUS
  Filled 2018-05-29: qty 2

## 2018-05-29 MED ORDER — METRONIDAZOLE IVPB CUSTOM: 250.0000 mg | Freq: Once | INTRAVENOUS | Status: DC

## 2018-05-29 MED ORDER — ONDANSETRON HCL 4 MG/2ML IJ SOLN
4.0000 mg | Freq: Four times a day (QID) | INTRAMUSCULAR | Status: DC | PRN
  Administered 2018-05-29: 4 mg via INTRAVENOUS
  Filled 2018-05-29: qty 2

## 2018-05-29 MED ORDER — IOHEXOL 300 MG/ML  SOLN
75.0000 mL | Freq: Once | INTRAMUSCULAR | Status: AC | PRN
  Administered 2018-05-29: 75 mL via INTRAVENOUS

## 2018-05-29 MED ORDER — POTASSIUM CHLORIDE IN NACL 20-0.9 MEQ/L-% IV SOLN
INTRAVENOUS | Status: AC
  Administered 2018-05-29 – 2018-05-30 (×3): via INTRAVENOUS

## 2018-05-29 MED ORDER — CIPROFLOXACIN IN D5W 200 MG/100ML IV SOLN: 200.0000 mg | Freq: Two times a day (BID) | INTRAVENOUS | Status: DC

## 2018-05-29 MED ORDER — LABETALOL HCL 5 MG/ML IV SOLN
5.0000 mg | INTRAVENOUS | Status: DC | PRN
  Administered 2018-05-30: 5 mg via INTRAVENOUS
  Filled 2018-05-29 (×2): qty 4

## 2018-05-29 MED ORDER — FLEET ENEMA 7-19 GM/118ML RE ENEM
1.0000 | ENEMA | Freq: Once | RECTAL | Status: AC
  Administered 2018-05-29: 1 via RECTAL

## 2018-05-29 MED ORDER — AMLODIPINE BESYLATE 5 MG PO TABS
10.0000 mg | ORAL_TABLET | Freq: Every day | ORAL | Status: DC
  Administered 2018-05-31 – 2018-06-04 (×5): 10 mg via ORAL
  Filled 2018-05-29 (×7): qty 2

## 2018-05-29 MED ORDER — LABETALOL HCL 5 MG/ML IV SOLN
5.0000 mg | Freq: Once | INTRAVENOUS | Status: AC
  Administered 2018-05-29: 5 mg via INTRAVENOUS
  Filled 2018-05-29: qty 4

## 2018-05-29 MED ORDER — ACETAMINOPHEN 650 MG RE SUPP: 650.0000 mg | Freq: Four times a day (QID) | RECTAL | Status: DC | PRN

## 2018-05-29 NOTE — ED Notes (Signed)
CRITICAL VALUE ALERT  Critical Value:  Trop 0.04  Date & Time Notied: 05/29/2018, 0569  Provider Notified: Dr. Tomi Bamberger  Orders Received/Actions taken: see chart

## 2018-05-29 NOTE — ED Provider Notes (Addendum)
[facility] EMERGENCY DEPARTMENT Provider Note   CSN: [id number] Arrival date & time: 05/29/18  1749   Level 5 caveat: Dementia History   Chief Complaint Chief Complaint  Patient presents with  . Emesis    HPI [name] is a 85 y.o. female.     HPI  Patient presents from nursing facility for nausea vomiting that started about an hour ago.  He has a history of dementia as well as pulmonary fibrosis.  She presents emergency room today after experiencing nausea vomiting at her nursing home at about an hour ago.  Patient reportedly was given 5 mg of Zofran IM without significant improvement.  She was sent to the ED for further evaluation.  Patient is actively dry heaving in the emergency room.  She is unable to provide me with any further history.  She does respond to some questions but is difficult to understand. Past Medical History:  Diagnosis Date  . Anxiety   . Dementia ([place])   . Hypertension   . Overactive bladder   . Pneumonia   . Stroke [facility])     Patient Active Problem List   Diagnosis Date Noted  . Interstitial pulmonary disease ([place]) 03/29/2018  . Volume overload 03/29/2018  . Hypoxia 03/28/2018  . Dementia ([place]) 03/28/2018  . Goals of care, counseling/discussion   . Encounter for hospice care discussion   . Palliative care by specialist   . Terminal care   . Agitation   . Dyspnea   . HCAP (healthcare-associated pneumonia) 02/03/2018  . AKI (acute kidney injury) ([place]) 02/03/2018  . Dehydration 02/03/2018  . Acute respiratory failure with hypoxia ([place]) 02/03/2018  . Pneumonia 12/17/2017  . Altered mental status 07/10/2016  . Urinary incontinence 07/10/2016  . History of CVA (cerebrovascular accident) - by CT scan 07/10/2016  . Compression fracture of L2 lumbar vertebra (HCC) 11/25/2015  . Compression fracture of spine, non-traumatic ([place]) 11/25/2015  . Osteoporosis 11/25/2015  . Peripheral arterial disease ([place]) 11/25/2015  . Pyelonephritis, acute  11/23/2015  . UTI (urinary tract infection) 11/23/2015  . Community acquired pneumonia 07/11/2014  . Urinary tract infection, site not specified 03/01/2012  . Acute delirium 03/01/2012  . Essential hypertension, benign 03/01/2012  . Hypokalemia 03/01/2012    Past Surgical History:  Procedure Laterality Date  . HIP ADDUCTOR TENOTOMY    . JOINT REPLACEMENT       OB History   No obstetric history on file.      Home Medications    Prior to Admission medications   Medication Sig Start Date End Date Taking? Authorizing Provider  acetaminophen (TYLENOL) 500 MG tablet Take 1,000 mg by mouth at bedtime.    Yes [provider]  bisacodyl (DULCOLAX) 10 MG suppository Place 1 suppository (10 mg total) rectally daily as needed for moderate constipation. 02/12/18  Yes Sinda Du, MD  conjugated estrogens (PREMARIN) vaginal cream Place 1 Applicatorful vaginally at bedtime.   Yes [provider]  doxycycline (MONODOX) 100 MG capsule Take 100 mg by mouth 2 (two) times daily.   Yes [provider]  LORazepam (ATIVAN) 0.5 MG tablet Take 1 tablet (0.5 mg total) by mouth every 4 (four) hours as needed for anxiety or sedation. 02/12/18  Yes Sinda Du, MD  metoprolol tartrate (LOPRESSOR) 25 MG tablet Take 1 tablet (25 mg total) by mouth 2 (two) times daily. 02/12/18  Yes Sinda Du, MD  mirabegron ER (MYRBETRIQ) 25 MG TB24 tablet Take 25 mg by mouth every morning.  Yes [provider]  sertraline (ZOLOFT) 50 MG tablet Take 50 mg by mouth every morning.  06/24/16  Yes [provider]  traZODone (DESYREL) 50 MG tablet Take 50 mg by mouth at bedtime.   Yes [provider]    Family History No family history on file.  Social History Social History   Tobacco Use  . Smoking status: Never Smoker  . Smokeless tobacco: Never Used  Substance Use Topics  . Alcohol use: No  . Drug use: No     Allergies   Sulfa antibiotics   Review  of Systems Review of Systems  Unable to perform ROS: Dementia     Physical Exam Updated Vital Signs BP (!) 172/65   Pulse 85   Temp 97.6 F (36.4 C) (Oral)   Resp 14   SpO2 100%   Physical Exam Vitals signs and nursing note reviewed.  Constitutional:      Appearance: She is well-developed. She is ill-appearing.     Comments: Elderly, frail  HENT:     Head: Normocephalic and atraumatic.     Right Ear: External ear normal.     Left Ear: External ear normal.  Eyes:     General: No scleral icterus.       Right eye: No discharge.        Left eye: No discharge.     Conjunctiva/sclera: Conjunctivae normal.  Neck:     Musculoskeletal: Neck supple.     Trachea: No tracheal deviation.  Cardiovascular:     Rate and Rhythm: Normal rate and regular rhythm.  Pulmonary:     Effort: Pulmonary effort is normal. No respiratory distress.     Breath sounds: Normal breath sounds. No stridor. No wheezing or rales.  Abdominal:     General: Bowel sounds are normal. There is no distension.     Palpations: Abdomen is soft. There is no mass.     Tenderness: There is no abdominal tenderness. There is no guarding or rebound.     Comments: Brownish mucus noted around the mouth, no significant volume of fluids and vomiting  Musculoskeletal:        General: No tenderness.  Skin:    General: Skin is warm and dry.     Findings: No rash.  Neurological:     Cranial Nerves: No cranial nerve deficit (no facial droop, extraocular movements intact,  ).     Sensory: No sensory deficit.     Motor: No abnormal muscle tone or seizure activity.     Comments: Patient is extremities      ED Treatments / Results  Labs (all labs ordered are listed, but only abnormal results are displayed) Labs Reviewed  COMPREHENSIVE METABOLIC PANEL - Abnormal; Notable for the following components:      Result Value   Glucose, Bld 170 (*)    Calcium 8.6 (*)    Total Bilirubin 1.9 (*)    GFR calc non Af Amer 60 (*)     All other components within normal limits  CBC WITH DIFFERENTIAL/PLATELET - Abnormal; Notable for the following components:   WBC 15.2 (*)    Neutro Abs 13.8 (*)    Lymphs Abs 0.5 (*)    All other components within normal limits  URINALYSIS, ROUTINE W REFLEX MICROSCOPIC - Abnormal; Notable for the following components:   Color, Urine STRAW (*)    APPearance HAZY (*)    Glucose, UA 50 (*)    Hgb urine dipstick SMALL (*)  Protein, ur 100 (*)    Bacteria, UA RARE (*)    All other components within normal limits  TROPONIN I - Abnormal; Notable for the following components:   Troponin I 0.04 (*)    All other components within normal limits  SARS CORONAVIRUS 2 (HOSPITAL ORDER, [place] [facility])  LIPASE, BLOOD    EKG EKG Interpretation  Date/Time:  Tuesday May 29 2018 08:15:30 EDT Ventricular Rate:  100 PR Interval:    QRS Duration: 110 QT Interval:  384 QTC Calculation: 496 R Axis:   51 Text Interpretation:  Sinus tachycardia Anterior infarct, old Nonspecific repol abnormality, lateral leads Since last tracing rate faster Confirmed by Dorie Rank [phone]) on 05/29/2018 8:53:07 AM   Radiology Ct Head Wo Contrast  Result Date: 05/29/2018 CLINICAL DATA:  Altered level of consciousness, unexplained. History of acute delirium. History of dementia, and hypertension. EXAM: CT HEAD WITHOUT CONTRAST TECHNIQUE: Contiguous axial images were obtained from the base of the skull through the vertex without intravenous contrast. COMPARISON:  03/28/2018 FINDINGS: Significant motion degradation despite best attempts of the technologist. The study is of marginal diagnostic utility. Brain: Advanced atrophy with multilobar infarcts including LEFT frontal, LEFT posterior limb internal capsule and white matter, RIGHT basal ganglia, and RIGHT occipital lobes, but no definite acute infarct, hemorrhage, mass lesion, hydrocephalus, or extra-axial fluid. Advanced chronic microvascular  ischemic change is superimposed. Vascular: Calcification of the cavernous internal carotid arteries consistent with cerebrovascular atherosclerotic disease. No signs of intracranial large vessel occlusion. Skull: Calvarium grossly intact. Sinuses/Orbits: No acute finding. Other: None IMPRESSION: Significantly motion degraded scan demonstrates no definite acute finding. One can be fairly confident that no hemorrhage is present. Compared with priors, age advanced atrophy, chronic multilobar infarcts, and small vessel disease appear grossly stable. Electronically Signed   By: Staci Righter M.D.   On: 05/29/2018 09:30   CT abd pelvis endings reviewed Impression: Circumferential wall thickening from the ascending colon to the descending colon suspicion for mild colitis.  No obstruction.  No evidence of diverticulitis.  Stool ball noticed in the rectum.  Trace layering of pleural effusions.  Aortic atherosclerosis.  Procedures Procedures (including critical care time)  Medications Ordered in ED Medications  0.9 %  sodium chloride infusion ( Intravenous New Bag/Given 05/29/18 0822)  ondansetron (ZOFRAN) injection 4 mg (4 mg Intravenous Given 05/29/18 0822)  iohexol (OMNIPAQUE) 300 MG/ML solution 75 mL (75 mLs Intravenous Contrast Given 05/29/18 1101)     Initial Impression / Assessment and Plan / ED Course  I have reviewed the triage vital signs and the nursing notes.  Pertinent labs & imaging results that were available during my care of the patient were reviewed by me and considered in my medical decision making (see chart for details).  Clinical Course as of May 29 1430  Tue May 29, 2018  0946 Patient still appears to be having some dry heaving although less so.  She continues to [facility] when I speak to her.   [JK]  [id number] CT scan limited by patient's movement but no findings to suggest hemorrhage.   [JK]    Clinical Course User Index [JK] Dorie Rank, MD     Patient presented to the emergency  room for evaluation of multiple episodes of nausea vomiting.  In the ED the patient had several episodes of dry heaves.  Her laboratory tests were notable for an elevated white blood cell count.  Patient did test negative for covid.  She had a slight  increase in her troponin and her symptoms are not suggestive of acute coronary syndrome.  Will need to continue to monitor her troponin.  Her CT scan finding does suggest possibility of colitis.  I suspect this is the etiology of her nausea and vomiting.  Considering her age, comorbidities and lab abnormalities I will consult the medical service for admission. Final Clinical Impressions(s) / ED Diagnoses   Final diagnoses:  Colitis  Elevated troponin       Dorie Rank, MD 05/29/18 1432  I spoke with daughter, shirley and updated her on the status   Dorie Rank, MD 05/29/18 1526

## 2018-05-29 NOTE — Progress Notes (Signed)
Fleets enema given.  Large amount Brown stool returned.  Patient resting comfortably in room.  Will continue to monitor.

## 2018-05-29 NOTE — ED Notes (Signed)
Patient transported to CT 

## 2018-05-29 NOTE — Progress Notes (Signed)
Patient has been lethargic since admission.Had eyes open when being transferred to bed from stretcher but has not talked and would not squeeze hands.  Called staff at [facility] for some information and they stated that she has been more confused in the last week and they found her under the bed recently and that she had been talking but talking more "our of her head".  At this time patient is asleep and moans every now and then.  Has not vomited since admitted to floor. Would not sip straw so did not give blood pressure medication

## 2018-05-29 NOTE — H&P (Addendum)
History and Physical    [name] HFW:[id number] DOB: 07-Aug-1920 DOA: 05/29/2018  PCP: Sinda Du, MD   Patient coming from: [place] home  I have personally briefly reviewed patient's old medical records in [place]  Chief Complaint: Vomiting  HPI: [name] is a 85 y.o. female with medical history significant for dementia, hypertension, osteoporosis, generalized anxiety, pulmonary fibrosis who was brought to the ED with complaints of vomiting about an hour prior to presentation in the ED.  Was given Zofran without significant improvement hence presentation to the ED. The time of my evaluation patient is confused, unable to give me history or tell me if she has pain.  ED Course: Blood pressure systolic up to 027, temp 74.1, heart rate 70s to 80s, WBC 15.2.  Creatinine about baseline 0.8.  UA rare bacteria.  Head CT -difficulty motion degraded, but no definite acute abnormality.  CT abdomen and pelvis ordered, but per ED provider suggestive of colitis.  Mildly elevated troponin at 0.04.  IV ciprofloxacin and metronidazole started in ED, hospitalist to admit for possible colitis, with vomiting.  Review of Systems: Unable to ascertain due to patient's mental status.  Past Medical History:  Diagnosis Date  . Anxiety   . Dementia ([place])   . Hypertension   . Overactive bladder   . Pneumonia   . Stroke [facility])     Past Surgical History:  Procedure Laterality Date  . HIP ADDUCTOR TENOTOMY    . JOINT REPLACEMENT       reports that she has never smoked. She has never used smokeless tobacco. She reports that she does not drink alcohol or use drugs.  Allergies  Allergen Reactions  . Sulfa Antibiotics     Not listed on MAR provided by Nursing facility   Unable to ascertain due to patient's mental status.  Prior to Admission medications   Medication Sig Start Date End Date Taking? Authorizing Provider  acetaminophen (TYLENOL) 500 MG tablet Take 1,000  mg by mouth at bedtime.    Yes [provider]  bisacodyl (DULCOLAX) 10 MG suppository Place 1 suppository (10 mg total) rectally daily as needed for moderate constipation. 02/12/18  Yes Sinda Du, MD  conjugated estrogens (PREMARIN) vaginal cream Place 1 Applicatorful vaginally at bedtime.   Yes [provider]  doxycycline (MONODOX) 100 MG capsule Take 100 mg by mouth 2 (two) times daily.   Yes [provider]  LORazepam (ATIVAN) 0.5 MG tablet Take 1 tablet (0.5 mg total) by mouth every 4 (four) hours as needed for anxiety or sedation. 02/12/18  Yes Sinda Du, MD  metoprolol tartrate (LOPRESSOR) 25 MG tablet Take 1 tablet (25 mg total) by mouth 2 (two) times daily. 02/12/18  Yes Sinda Du, MD  mirabegron ER (MYRBETRIQ) 25 MG TB24 tablet Take 25 mg by mouth every morning.    Yes [provider]  sertraline (ZOLOFT) 50 MG tablet Take 50 mg by mouth every morning.  06/24/16  Yes [provider]  traZODone (DESYREL) 50 MG tablet Take 50 mg by mouth at bedtime.   Yes [provider]    Physical Exam: Exam limited by patient's mental status. Vitals:   05/29/18 1200 05/29/18 1230 05/29/18 1300 05/29/18 1415  BP: (!) 162/67 (!) 151/59 (!) 172/65   Pulse:    81  Resp: 18 19 14 18   Temp:      TempSrc:      SpO2:    99%  Constitutional: Mildly agitated, appears confused-unsure how far from patient's baseline she is considering history of dementia. Vitals:   05/29/18 1200 05/29/18 1230 05/29/18 1300 05/29/18 1415  BP: (!) 162/67 (!) 151/59 (!) 172/65   Pulse:    81  Resp: 18 19 14 18   Temp:      TempSrc:      SpO2:    99%   Eyes: PERRL, lids and conjunctivae normal ENMT: Mucous membranes are dry.  Neck: normal, supple, no masses, no thyromegaly Respiratory: Auscultation, clear to auscultation bilaterally, no wheezing, no crackles. Normal respiratory effort. No accessory muscle use.  Cardiovascular: Regular rate and rhythm,  no murmurs / rubs / gallops. No extremity edema. 2+ pedal pulses. .  Abdomen: no tenderness, no masses palpated. No hepatosplenomegaly. Bowel sounds positive.  Musculoskeletal: no clubbing / cyanosis. No joint deformity upper and lower extremities. Good ROM, no contractures. Normal muscle tone.  Skin: no rashes, lesions, ulcers. No induration Neurologic: Unable to fully examine due to patient's mental status, but moving all extremities spontaneously Psychiatric: Unable to fully ascertain, appears confused, mildly agitated  Labs on Admission: I have personally reviewed following labs and imaging studies  CBC: Recent Labs  Lab 05/29/18 0914  WBC 15.2*  NEUTROABS 13.8*  HGB 14.2  HCT 44.2  MCV 95.5  PLT 812   Basic Metabolic Panel: Recent Labs  Lab 05/29/18 0914  NA 138  K 3.8  CL 101  CO2 25  GLUCOSE 170*  BUN 19  CREATININE 0.83  CALCIUM 8.6*   Liver Function Tests: Recent Labs  Lab 05/29/18 0914  AST 28  ALT 20  ALKPHOS 102  BILITOT 1.9*  PROT 8.1  ALBUMIN 4.3   Recent Labs  Lab 05/29/18 0914  LIPASE 28   Cardiac Enzymes: Recent Labs  Lab 05/29/18 0914  TROPONINI 0.04*   Urine analysis:    Component Value Date/Time   COLORURINE STRAW (A) 05/29/2018 0816   APPEARANCEUR HAZY (A) 05/29/2018 0816   LABSPEC 1.009 05/29/2018 0816   PHURINE 8.0 05/29/2018 0816   GLUCOSEU 50 (A) 05/29/2018 0816   HGBUR SMALL (A) 05/29/2018 0816   BILIRUBINUR NEGATIVE 05/29/2018 0816   KETONESUR NEGATIVE 05/29/2018 0816   PROTEINUR 100 (A) 05/29/2018 0816   UROBILINOGEN 0.2 02/28/2012 1830   NITRITE NEGATIVE 05/29/2018 0816   LEUKOCYTESUR NEGATIVE 05/29/2018 0816    Radiological Exams on Admission: Ct Head Wo Contrast  Result Date: 05/29/2018 CLINICAL DATA:  Altered level of consciousness, unexplained. History of acute delirium. History of dementia, and hypertension. EXAM: CT HEAD WITHOUT CONTRAST TECHNIQUE: Contiguous axial images were obtained from the base of the  skull through the vertex without intravenous contrast. COMPARISON:  03/28/2018 FINDINGS: Significant motion degradation despite best attempts of the technologist. The study is of marginal diagnostic utility. Brain: Advanced atrophy with multilobar infarcts including LEFT frontal, LEFT posterior limb internal capsule and white matter, RIGHT basal ganglia, and RIGHT occipital lobes, but no definite acute infarct, hemorrhage, mass lesion, hydrocephalus, or extra-axial fluid. Advanced chronic microvascular ischemic change is superimposed. Vascular: Calcification of the cavernous internal carotid arteries consistent with cerebrovascular atherosclerotic disease. No signs of intracranial large vessel occlusion. Skull: Calvarium grossly intact. Sinuses/Orbits: No acute finding. Other: None IMPRESSION: Significantly motion degraded scan demonstrates no definite acute finding. One can be fairly confident that no hemorrhage is present. Compared with priors, age advanced atrophy, chronic multilobar infarcts, and small vessel disease appear grossly stable. Electronically Signed   By: Staci Righter M.D.   On: 05/29/2018  09:30    EKG: Independently reviewed.  Sinus rhythm, artifact present, but no significant ST or T wave abnormalities compared to prior EKG.  QTc 496.  Assessment/Plan Active Problems:   Colitis   Colitis-without abdominal pain, possibly with alteration in mental status..  WBC 15.2.  Hypertensive but stable vitals. Ciprofloxacin and metronidazole started in the ED. -  CT abdomen and pelvis- formal read resulted -  mild acute colitis, diverticulosis, and stool ball in rectum suggesting fecal impaction.  -Continue IV metronidazole, will start IV ceftriaxone (considering advanced age and high risk for adverse effects with quinolones. -CBC BMP a.m. -Bowel rest with clear liquid diet, advance as tolerated -IV Zofran PRN - Addendum- Patient on floor, not talking but would open eyes to voice and touch. Make  NPO and Will attempt disimpaction on the floor.  Hypertensive urgency- systolic up to 539.  Possibly elevated due to agitation, -Continue home low-dose metoprolol, Norvasc -PRN IV labetalol low-dose considering advanced age  Elevated Troponin- likely troponin leak 2/2 marked elevated blood pressure. EKG without significant changes. No cardiac hx. - Trend troponin.   Dementia, generalized anxiety-patient is confused but unsure how far off she is from her baseline.  - Cont home sertraline    DVT prophylaxis: Lovenox Code Status: DNR- Yellow form at bedside Family Communication: None at bedside Disposition Plan:  Per rounding team Consults called: None Admission status: Obs, tele   Bethena Roys MD [facility]  05/29/2018, 3:22 PM

## 2018-05-29 NOTE — ED Triage Notes (Signed)
Pt from [place].  Pt started with n/v an hour ago.  Pt hypertensive.  Systolic 017

## 2018-05-30 DIAGNOSIS — E876 Hypokalemia: Secondary | ICD-10-CM | POA: Diagnosis present

## 2018-05-30 DIAGNOSIS — K529 Noninfective gastroenteritis and colitis, unspecified: Secondary | ICD-10-CM | POA: Diagnosis present

## 2018-05-30 DIAGNOSIS — Z66 Do not resuscitate: Secondary | ICD-10-CM | POA: Diagnosis present

## 2018-05-30 DIAGNOSIS — H9190 Unspecified hearing loss, unspecified ear: Secondary | ICD-10-CM | POA: Diagnosis present

## 2018-05-30 DIAGNOSIS — Z1159 Encounter for screening for other viral diseases: Secondary | ICD-10-CM | POA: Diagnosis not present

## 2018-05-30 DIAGNOSIS — Z8673 Personal history of transient ischemic attack (TIA), and cerebral infarction without residual deficits: Secondary | ICD-10-CM | POA: Diagnosis not present

## 2018-05-30 DIAGNOSIS — N3281 Overactive bladder: Secondary | ICD-10-CM | POA: Diagnosis present

## 2018-05-30 DIAGNOSIS — Z96641 Presence of right artificial hip joint: Secondary | ICD-10-CM | POA: Diagnosis present

## 2018-05-30 DIAGNOSIS — R7989 Other specified abnormal findings of blood chemistry: Secondary | ICD-10-CM | POA: Diagnosis present

## 2018-05-30 DIAGNOSIS — I10 Essential (primary) hypertension: Secondary | ICD-10-CM | POA: Diagnosis present

## 2018-05-30 DIAGNOSIS — R1310 Dysphagia, unspecified: Secondary | ICD-10-CM | POA: Diagnosis present

## 2018-05-30 DIAGNOSIS — I160 Hypertensive urgency: Secondary | ICD-10-CM | POA: Diagnosis present

## 2018-05-30 DIAGNOSIS — J8410 Pulmonary fibrosis, unspecified: Secondary | ICD-10-CM | POA: Diagnosis present

## 2018-05-30 DIAGNOSIS — Z882 Allergy status to sulfonamides status: Secondary | ICD-10-CM | POA: Diagnosis not present

## 2018-05-30 DIAGNOSIS — F329 Major depressive disorder, single episode, unspecified: Secondary | ICD-10-CM | POA: Diagnosis present

## 2018-05-30 DIAGNOSIS — L89150 Pressure ulcer of sacral region, unstageable: Secondary | ICD-10-CM | POA: Diagnosis present

## 2018-05-30 DIAGNOSIS — M810 Age-related osteoporosis without current pathological fracture: Secondary | ICD-10-CM | POA: Diagnosis present

## 2018-05-30 DIAGNOSIS — F0390 Unspecified dementia without behavioral disturbance: Secondary | ICD-10-CM | POA: Diagnosis present

## 2018-05-30 DIAGNOSIS — F411 Generalized anxiety disorder: Secondary | ICD-10-CM | POA: Diagnosis present

## 2018-05-30 DIAGNOSIS — Z79899 Other long term (current) drug therapy: Secondary | ICD-10-CM | POA: Diagnosis not present

## 2018-05-30 LAB — BASIC METABOLIC PANEL
Anion gap: 11 (ref 5–15)
BUN: 20 mg/dL (ref 8–23)
CO2: 24 mmol/L (ref 22–32)
Calcium: 8.4 mg/dL — ABNORMAL LOW (ref 8.9–10.3)
Chloride: 105 mmol/L (ref 98–111)
Creatinine, Ser: 0.84 mg/dL (ref 0.44–1.00)
GFR calc Af Amer: >60 mL/min (ref >60)
GFR calc non Af Amer: 59 mL/min — ABNORMAL LOW (ref >60)
Glucose, Bld: 108 mg/dL — ABNORMAL HIGH (ref 70–99)
Potassium: 3.3 mmol/L — ABNORMAL LOW (ref 3.5–5.1)
Sodium: 140 mmol/L (ref 135–145)

## 2018-05-30 LAB — CBC
HCT: 38.6 % (ref 36.0–46.0)
Hemoglobin: 12.2 g/dL (ref 12.0–15.0)
MCH: 29.8 pg (ref 26.0–34.0)
MCHC: 31.6 g/dL (ref 30.0–36.0)
MCV: 94.1 fL (ref 80.0–100.0)
Platelets: 258 10*3/uL (ref 150–400)
RBC: 4.1 MIL/uL (ref 3.87–5.11)
RDW: 12.9 % (ref 11.5–15.5)
WBC: 11.8 10*3/uL — ABNORMAL HIGH (ref 4.0–10.5)
nRBC: 0 % (ref 0.0–0.2)

## 2018-05-30 LAB — TROPONIN I: Troponin I: 0.08 ng/mL (ref <0.03)

## 2018-05-30 MED ORDER — LABETALOL HCL 5 MG/ML IV SOLN
5.0000 mg | INTRAVENOUS | Status: DC | PRN
  Administered 2018-05-30 – 2018-06-02 (×5): 5 mg via INTRAVENOUS
  Filled 2018-05-30: qty 4

## 2018-05-30 NOTE — Progress Notes (Signed)
More alert this morning, not answering questions but stating "what are you doing that for' when wrapping IV.   When putting straw to mouth, made no attempt to drink and would not open mouth for small bite of applesauce.  Unable to give po meds and Dr. Luan Pulling lowered parameters for labetalol IV to 483 systolic.

## 2018-05-30 NOTE — Progress Notes (Signed)
Subjective: She was admitted yesterday with what appears to be colitis.  She was pretty obtunded when she came to the floor yesterday according to her nurse who took care of her yesterday as well.  She is more alert but still not answering questions.  I do not think she is safe to take anything by mouth yet.  Objective: Vital signs in last 24 hours: Temp:  [98.6 F (37 C)] 98.6 F (37 C) (05/12 1818) Pulse Rate:  [71-92] 71 (05/12 1818) Resp:  [14-24] 15 (05/12 1818) BP: (151-220)/(59-125) 175/75 (05/13 0801) SpO2:  [92 %-100 %] 93 % (05/12 1930) Weight:  [40.8 kg-45.3 kg] 40.8 kg (05/12 1818) Weight change:  Last BM Date: 05/29/18  Intake/Output from previous day: 05/12 0701 - 05/13 0700 In: 554.8 [I.V.:164.2; IV Piggyback:390.5] Out: -   PHYSICAL EXAM General appearance: More alert.  Still confused Resp: rhonchi bilaterally Cardio: regular rate and rhythm, S1, S2 normal, no murmur, click, rub or gallop GI: Minimal tenderness Extremities: extremities normal, atraumatic, no cyanosis or edema  Lab Results:  Results for orders placed or performed during the hospital encounter of 05/29/18 (from the past 48 hour(s))  Urinalysis, Routine w reflex microscopic     Status: Abnormal   Collection Time: 05/29/18  8:16 AM  Result Value Ref Range   Color, Urine STRAW (A) YELLOW   APPearance HAZY (A) CLEAR   Specific Gravity, Urine 1.009 1.005 - 1.030   pH 8.0 5.0 - 8.0   Glucose, UA 50 (A) NEGATIVE mg/dL   Hgb urine dipstick SMALL (A) NEGATIVE   Bilirubin Urine NEGATIVE NEGATIVE   Ketones, ur NEGATIVE NEGATIVE mg/dL   Protein, ur 100 (A) NEGATIVE mg/dL   Nitrite NEGATIVE NEGATIVE   Leukocytes,Ua NEGATIVE NEGATIVE   RBC / HPF 0-5 0 - 5 RBC/hpf   WBC, UA 0-5 0 - 5 WBC/hpf   Bacteria, UA RARE (A) NONE SEEN   Squamous Epithelial / LPF 0-5 0 - 5   Ca Oxalate Crys, UA PRESENT     Comment: Performed at [facility], [address] [place], [place] [address]  SARS Coronavirus 2 (CEPHEID  - Performed in [place] hospital lab), Hosp Order     Status: None   Collection Time: 05/29/18  8:32 AM  Result Value Ref Range   SARS Coronavirus 2 NEGATIVE NEGATIVE    Comment: (NOTE) If result is NEGATIVE SARS-CoV-2 target nucleic acids are NOT DETECTED. The SARS-CoV-2 RNA is generally detectable in upper and lower  respiratory specimens during the acute phase of infection. The lowest  concentration of SARS-CoV-2 viral copies this assay can detect is 250  copies / mL. A negative result does not preclude SARS-CoV-2 infection  and should not be used as the sole basis for treatment or other  patient management decisions.  A negative result may occur with  improper specimen collection / handling, submission of specimen other  than nasopharyngeal swab, presence of viral mutation(s) within the  areas targeted by this assay, and inadequate number of viral copies  (<250 copies / mL). A negative result must be combined with clinical  observations, patient history, and epidemiological information. If result is POSITIVE SARS-CoV-2 target nucleic acids are DETECTED. The SARS-CoV-2 RNA is generally detectable in upper and lower  respiratory specimens dur ing the acute phase of infection.  Positive  results are indicative of active infection with SARS-CoV-2.  Clinical  correlation with patient history and other diagnostic information is  necessary to determine patient infection status.  Positive results do  not rule out bacterial infection or co-infection with other viruses. If result is PRESUMPTIVE POSTIVE SARS-CoV-2 nucleic acids MAY BE PRESENT.   A presumptive positive result was obtained on the submitted specimen  and confirmed on repeat testing.  While 2019 novel coronavirus  (SARS-CoV-2) nucleic acids may be present in the submitted sample  additional confirmatory testing may be necessary for epidemiological  and / or clinical management purposes  to differentiate between  SARS-CoV-2  and other Sarbecovirus currently known to infect humans.  If clinically indicated additional testing with an alternate test  methodology [phone]) is advised. The SARS-CoV-2 RNA is generally  detectable in upper and lower respiratory sp ecimens during the acute  phase of infection. The expected result is Negative. Fact Sheet for Patients:  StrictlyIdeas.no Fact Sheet for Healthcare Providers: BankingDealers.co.za This test is not yet approved or cleared by the [place] FDA and has been authorized for detection and/or diagnosis of SARS-CoV-2 by FDA under an Emergency Use Authorization (EUA).  This EUA will remain in effect (meaning this test can be used) for the duration of the COVID-19 declaration under Section 564(b)(1) of the Act, 21 U.S.C. section 360bbb-3(b)(1), unless the authorization is terminated or revoked sooner. Performed at [facility], [address] [place], [place] [address]   Comprehensive metabolic panel     Status: Abnormal   Collection Time: 05/29/18  9:14 AM  Result Value Ref Range   Sodium 138 135 - 145 mmol/L   Potassium 3.8 3.5 - 5.1 mmol/L   Chloride 101 98 - 111 mmol/L   CO2 25 22 - 32 mmol/L   Glucose, Bld 170 (H) 70 - 99 mg/dL   BUN 19 8 - 23 mg/dL   Creatinine, Ser 0.83 0.44 - 1.00 mg/dL   Calcium 8.6 (L) 8.9 - 10.3 mg/dL   Total Protein 8.1 6.5 - 8.1 g/dL   Albumin 4.3 3.5 - 5.0 g/dL   AST 28 15 - 41 U/L   ALT 20 0 - 44 U/L   Alkaline Phosphatase 102 38 - 126 U/L   Total Bilirubin 1.9 (H) 0.3 - 1.2 mg/dL   GFR calc non Af Amer 60 (L) >60 mL/min   GFR calc Af Amer >60 >60 mL/min   Anion gap 12 5 - 15    Comment: Performed at [facility], [address], [place], [place] [address]  Lipase, blood     Status: None   Collection Time: 05/29/18  9:14 AM  Result Value Ref Range   Lipase 28 11 - 51 U/L    Comment: Performed at [facility], [address] [place], [place] [address]  CBC with Diff      Status: Abnormal   Collection Time: 05/29/18  9:14 AM  Result Value Ref Range   WBC 15.2 (H) 4.0 - 10.5 K/uL   RBC 4.63 3.87 - 5.11 MIL/uL   Hemoglobin 14.2 12.0 - 15.0 g/dL   HCT 44.2 36.0 - 46.0 %   MCV 95.5 80.0 - 100.0 fL   MCH 30.7 26.0 - 34.0 pg   MCHC 32.1 30.0 - 36.0 g/dL   RDW 13.2 11.5 - 15.5 %   Platelets 216 150 - 400 K/uL    Comment: PLATELET COUNT CONFIRMED BY SMEAR SPECIMEN CHECKED FOR CLOTS    nRBC 0.0 0.0 - 0.2 %   Neutrophils Relative % 91 %   Neutro Abs 13.8 (H) 1.7 - 7.7 K/uL   Lymphocytes Relative 3 %   Lymphs  Abs 0.5 (L) 0.7 - 4.0 K/uL   Monocytes Relative 5 %   Monocytes Absolute 0.8 0.1 - 1.0 K/uL   Eosinophils Relative 0 %   Eosinophils Absolute 0.0 0.0 - 0.5 K/uL   Basophils Relative 0 %   Basophils Absolute 0.0 0.0 - 0.1 K/uL   Immature Granulocytes 1 %   Abs Immature Granulocytes 0.07 0.00 - 0.07 K/uL    Comment: Performed at [facility], [address], [place], [place] [address]  Troponin I - ONCE - STAT     Status: Abnormal   Collection Time: 05/29/18  9:14 AM  Result Value Ref Range   Troponin I 0.04 (HH) <0.03 ng/mL    Comment: CRITICAL RESULT CALLED TO, READ BACK BY AND VERIFIED WITH: CARDWELL,L AT 9:55AM ON 05/29/18 BY [facility] Performed at [facility], [address], [place], [place] [address]   Magnesium     Status: None   Collection Time: 05/29/18  4:35 PM  Result Value Ref Range   Magnesium 1.8 1.7 - 2.4 mg/dL    Comment: Performed at [facility], [address] [place], [place] [address]  Troponin I - Now Then Q6H     Status: Abnormal   Collection Time: 05/29/18  4:35 PM  Result Value Ref Range   Troponin I 0.12 (HH) <0.03 ng/mL    Comment: CRITICAL VALUE NOTED.  VALUE IS CONSISTENT WITH PREVIOUSLY REPORTED AND CALLED VALUE. Performed at [facility], [address] [place], [place] [address]   MRSA PCR Screening     Status: None   Collection Time: 05/29/18  9:20 PM  Result Value Ref Range   MRSA by PCR NEGATIVE NEGATIVE     Comment:        The GeneXpert MRSA Assay (FDA approved for NASAL specimens only), is one component of a comprehensive MRSA colonization surveillance program. It is not intended to diagnose MRSA infection nor to guide or monitor treatment for MRSA infections. Performed at [facility], [address], [place], [place] [address]   Troponin I - Now Then Q6H     Status: Abnormal   Collection Time: 05/29/18 11:58 PM  Result Value Ref Range   Troponin I 0.08 (HH) <0.03 ng/mL    Comment: CRITICAL VALUE NOTED.  VALUE IS CONSISTENT WITH PREVIOUSLY REPORTED AND CALLED VALUE. Performed at [facility], [address], [place], [place] [address]   Basic metabolic panel     Status: Abnormal   Collection Time: 05/30/18  4:14 AM  Result Value Ref Range   Sodium 140 135 - 145 mmol/L   Potassium 3.3 (L) 3.5 - 5.1 mmol/L   Chloride 105 98 - 111 mmol/L   CO2 24 22 - 32 mmol/L   Glucose, Bld 108 (H) 70 - 99 mg/dL   BUN 20 8 - 23 mg/dL   Creatinine, Ser 0.84 0.44 - 1.00 mg/dL   Calcium 8.4 (L) 8.9 - 10.3 mg/dL   GFR calc non Af Amer 59 (L) >60 mL/min   GFR calc Af Amer >60 >60 mL/min   Anion gap 11 5 - 15    Comment: Performed at [facility], [address], [place], [place] [address]  CBC     Status: Abnormal   Collection Time: 05/30/18  4:14 AM  Result Value Ref Range   WBC 11.8 (H) 4.0 - 10.5 K/uL   RBC 4.10 3.87 - 5.11 MIL/uL   Hemoglobin 12.2 12.0 - 15.0 g/dL   HCT 38.6 36.0 - 46.0 %  MCV 94.1 80.0 - 100.0 fL   MCH 29.8 26.0 - 34.0 pg   MCHC 31.6 30.0 - 36.0 g/dL   RDW 12.9 11.5 - 15.5 %   Platelets 258 150 - 400 K/uL   nRBC 0.0 0.0 - 0.2 %    Comment: Performed at [facility], [address] [place], [place] [address]    ABGS No results for input(s): PHART, PO2ART, TCO2, HCO3 in the last 72 hours.  Invalid input(s): PCO2 CULTURES Recent Results (from the past 240 hour(s))  SARS Coronavirus 2 (CEPHEID - Performed in [place] hospital lab), Hosp Order     Status: None    Collection Time: 05/29/18  8:32 AM  Result Value Ref Range Status   SARS Coronavirus 2 NEGATIVE NEGATIVE Final    Comment: (NOTE) If result is NEGATIVE SARS-CoV-2 target nucleic acids are NOT DETECTED. The SARS-CoV-2 RNA is generally detectable in upper and lower  respiratory specimens during the acute phase of infection. The lowest  concentration of SARS-CoV-2 viral copies this assay can detect is 250  copies / mL. A negative result does not preclude SARS-CoV-2 infection  and should not be used as the sole basis for treatment or other  patient management decisions.  A negative result may occur with  improper specimen collection / handling, submission of specimen other  than nasopharyngeal swab, presence of viral mutation(s) within the  areas targeted by this assay, and inadequate number of viral copies  (<250 copies / mL). A negative result must be combined with clinical  observations, patient history, and epidemiological information. If result is POSITIVE SARS-CoV-2 target nucleic acids are DETECTED. The SARS-CoV-2 RNA is generally detectable in upper and lower  respiratory specimens dur ing the acute phase of infection.  Positive  results are indicative of active infection with SARS-CoV-2.  Clinical  correlation with patient history and other diagnostic information is  necessary to determine patient infection status.  Positive results do  not rule out bacterial infection or co-infection with other viruses. If result is PRESUMPTIVE POSTIVE SARS-CoV-2 nucleic acids MAY BE PRESENT.   A presumptive positive result was obtained on the submitted specimen  and confirmed on repeat testing.  While 2019 novel coronavirus  (SARS-CoV-2) nucleic acids may be present in the submitted sample  additional confirmatory testing may be necessary for epidemiological  and / or clinical management purposes  to differentiate between  SARS-CoV-2 and other Sarbecovirus currently known to infect humans.   If clinically indicated additional testing with an alternate test  methodology [phone]) is advised. The SARS-CoV-2 RNA is generally  detectable in upper and lower respiratory sp ecimens during the acute  phase of infection. The expected result is Negative. Fact Sheet for Patients:  StrictlyIdeas.no Fact Sheet for Healthcare Providers: BankingDealers.co.za This test is not yet approved or cleared by the [place] FDA and has been authorized for detection and/or diagnosis of SARS-CoV-2 by FDA under an Emergency Use Authorization (EUA).  This EUA will remain in effect (meaning this test can be used) for the duration of the COVID-19 declaration under Section 564(b)(1) of the Act, 21 U.S.C. section 360bbb-3(b)(1), unless the authorization is terminated or revoked sooner. Performed at [facility], [address] [place], [place] [address]   MRSA PCR Screening     Status: None   Collection Time: 05/29/18  9:20 PM  Result Value Ref Range Status   MRSA by PCR NEGATIVE NEGATIVE Final    Comment:        The  GeneXpert MRSA Assay (FDA approved for NASAL specimens only), is one component of a comprehensive MRSA colonization surveillance program. It is not intended to diagnose MRSA infection nor to guide or monitor treatment for MRSA infections. Performed at [facility], [address], [place], [place] [address]    Studies/Results: Ct Head Wo Contrast  Result Date: 05/29/2018 CLINICAL DATA:  Altered level of consciousness, unexplained. History of acute delirium. History of dementia, and hypertension. EXAM: CT HEAD WITHOUT CONTRAST TECHNIQUE: Contiguous axial images were obtained from the base of the skull through the vertex without intravenous contrast. COMPARISON:  03/28/2018 FINDINGS: Significant motion degradation despite best attempts of the technologist. The study is of marginal diagnostic utility. Brain: Advanced atrophy with  multilobar infarcts including LEFT frontal, LEFT posterior limb internal capsule and white matter, RIGHT basal ganglia, and RIGHT occipital lobes, but no definite acute infarct, hemorrhage, mass lesion, hydrocephalus, or extra-axial fluid. Advanced chronic microvascular ischemic change is superimposed. Vascular: Calcification of the cavernous internal carotid arteries consistent with cerebrovascular atherosclerotic disease. No signs of intracranial large vessel occlusion. Skull: Calvarium grossly intact. Sinuses/Orbits: No acute finding. Other: None IMPRESSION: Significantly motion degraded scan demonstrates no definite acute finding. One can be fairly confident that no hemorrhage is present. Compared with priors, age advanced atrophy, chronic multilobar infarcts, and small vessel disease appear grossly stable. Electronically Signed   By: Staci Righter M.D.   On: 05/29/2018 09:30   Ct Abdomen Pelvis W Contrast  Result Date: 05/29/2018 CLINICAL DATA:  85 year old female with nausea vomiting and abdominal pain onset today. Hypertensive. EXAM: CT ABDOMEN AND PELVIS WITH CONTRAST TECHNIQUE: Multidetector CT imaging of the abdomen and pelvis was performed using the standard protocol following bolus administration of intravenous contrast. CONTRAST:  4mL OMNIPAQUE IOHEXOL 300 MG/ML  SOLN COMPARISON:  Noncontrast CT Abdomen and Pelvis 11/23/2015. FINDINGS: Lower chest: Trace bilateral pleural effusions. Lower lung volumes with possible mild septal thickening at the lung bases. Cardiomegaly has increased since 2017. No pericardial effusion. Calcified coronary artery and Calcified aortic atherosclerosis. Hepatobiliary: Surgically absent gallbladder as before. Stable and negative liver. Pancreas: Atrophied, otherwise negative. Spleen: Negative. Adrenals/Urinary Tract: Normal adrenal glands. Bulky chronic 15 millimeter left renal calculus is located in the lower pole today with no hydronephrosis. Bilateral renal  enhancement and contrast excretion is symmetric and within normal limits. There are occasional small benign appearing renal cysts. No hydroureter. Streak artifact in the pelvis due to hip arthroplasty, no abnormality of the urinary bladder identified. Stomach/Bowel: 7-8 centimeter stool ball in the rectum. No definite rectal inflammation. Severe chronic diverticulosis of the sigmoid colon, no active inflammation identified. Mild diverticulosis of the descending colon, where there is circumferential colonic wall thickening and hyper enhancement on series 2, image 42. This continues into the transverse colon which is largely decompressed and difficult to delineate from small bowel. The ascending colon also demonstrates some wall thickening although the cecum appears relatively spared. Negative terminal ileum. No dilated small bowel. Gastric ptosis, but otherwise negative stomach. No free air or free fluid identified. Vascular/Lymphatic: Extensive Aortoiliac calcified atherosclerosis. The major arterial structures appear patent. Portal venous system appears to be patent. No lymphadenopathy. Reproductive: Stable calcified uterine fibroids. Other: No pelvic free fluid. Musculoskeletal: Osteopenia. Chronic L2 and L5 compression fractures, the latter has mildly progressed since 2017. Previous mid sacral fracture suspected and stable. Chronic right hip arthroplasty. No acute osseous abnormality identified. IMPRESSION: 1. Circumferential wall thickening from the ascending to the descending colon suspicious for mild acute colitis. No obstruction, free  fluid, or complicating features. 2. Severe diverticulosis of the sigmoid colon but no active inflammation identified. Stool ball in the rectum suggesting fecal impaction. 3. Trace layering pleural effusions, questionable pulmonary interstitial edema. Progressed cardiomegaly since 2017. No pericardial effusion. 4.  Aortic Atherosclerosis (ICD10-I70.0). Electronically Signed   By:  Genevie Ann M.D.   On: 05/29/2018 11:41   Dg Abd Acute W/chest  Result Date: 05/29/2018 CLINICAL DATA:  Nausea and vomiting. Combative patient. EXAM: DG ABDOMEN ACUTE W/ 1V CHEST COMPARISON:  03/28/2018. FINDINGS: The heart is enlarged. There is mild vascular congestion. Skeletal osteopenia is noted. There is calcified thoracic and abdominal aorta. There is a moderate stool burden but no obstruction or free air. Cholecystectomy clips. Calcified fibroids. Previous RIGHT hip replacement. Chronic radiodense LEFT mid abdominal calcification, stable. IMPRESSION: 1. Cardiomegaly with mild vascular congestion. 2. Moderate stool burden without obstruction or free air. Electronically Signed   By: Staci Righter M.D.   On: 05/29/2018 09:40    Medications:  Prior to Admission:  Medications Prior to Admission  Medication Sig Dispense Refill Last Dose  . acetaminophen (TYLENOL) 500 MG tablet Take 1,000 mg by mouth at bedtime.    05/28/2018 at Unknown time  . bisacodyl (DULCOLAX) 10 MG suppository Place 1 suppository (10 mg total) rectally daily as needed for moderate constipation. 12 suppository 0 unknown  . conjugated estrogens (PREMARIN) vaginal cream Place 1 Applicatorful vaginally at bedtime.   05/28/2018 at Unknown time  . doxycycline (MONODOX) 100 MG capsule Take 100 mg by mouth 2 (two) times daily.   05/29/2018 at Unknown time  . LORazepam (ATIVAN) 0.5 MG tablet Take 1 tablet (0.5 mg total) by mouth every 4 (four) hours as needed for anxiety or sedation. 30 tablet 0 unknown  . metoprolol tartrate (LOPRESSOR) 25 MG tablet Take 1 tablet (25 mg total) by mouth 2 (two) times daily. 60 tablet 5 05/29/2018 at 0800  . mirabegron ER (MYRBETRIQ) 25 MG TB24 tablet Take 25 mg by mouth every morning.    05/29/2018 at Unknown time  . sertraline (ZOLOFT) 50 MG tablet Take 50 mg by mouth every morning.    05/29/2018 at Unknown time  . traZODone (DESYREL) 50 MG tablet Take 50 mg by mouth at bedtime.   05/28/2018 at Unknown time    Scheduled: . amLODipine  10 mg Oral Daily  . benazepril  20 mg Oral Daily  . conjugated estrogens  1 Applicatorful Vaginal QHS  . enoxaparin (LOVENOX) injection  30 mg Subcutaneous Q24H  . metoprolol tartrate  25 mg Oral BID  . mirabegron ER  25 mg Oral q morning - 10a  . sertraline  50 mg Oral q morning - 10a   Continuous: . 0.9 % NaCl with KCl 20 mEq / L 75 mL/hr at 05/30/18 0841  . cefTRIAXone (ROCEPHIN)  IV Stopped (05/29/18 2057)  . ciprofloxacin 400 mg (05/29/18 1552)  . metronidazole 500 mg (05/30/18 0604)   BHA:[id number] **OR** acetaminophen, labetalol, ondansetron **OR** ondansetron (ZOFRAN) IV, polyethylene glycol  Assesment: She has colitis which is being treated with IV antibiotics.  She has hypertension and I am going to have her take PRN labetalol  She is confused not at baseline mental status  I do not think it safe for her to take p.o. yet  She has mild hypokalemia and is on potassium replacement Active Problems:   Colitis   Pressure injury of skin    Plan: Speech evaluation.  Change her parameters for blood pressure treatment.  Continue  other meds.  N.p.o. for now.  Full admission    LOS: 0 days   Alonza Bogus 05/30/2018, 8:46 AM

## 2018-05-30 NOTE — Evaluation (Signed)
Clinical/Bedside Swallow Evaluation Patient Details  Name: [name] MRN: [id number] Date of Birth: 08/17/1920  Today's Date: 05/30/2018 Time: SLP Start Time (ACUTE ONLY): 1215 SLP Stop Time (ACUTE ONLY): 1242 SLP Time Calculation (min) (ACUTE ONLY): 27 min  Past Medical History:  Past Medical History:  Diagnosis Date  . Anxiety   . Dementia ([place])   . Hypertension   . Overactive bladder   . Pneumonia   . Stroke [facility])    Past Surgical History:  Past Surgical History:  Procedure Laterality Date  . HIP ADDUCTOR TENOTOMY    . JOINT REPLACEMENT     HPI:  [name] is a 85 y.o. female with medical history significant for dementia, hypertension, osteoporosis, generalized anxiety, pulmonary fibrosis who was brought to the ED with complaints of vomiting about an hour prior to presentation in the ED.  Was given Zofran without significant improvement hence presentation to the ED. The time of my evaluation patient is confused, unable to give me history or tell me if she has pain. ED Course: Blood pressure systolic up to 737, temp 10.6, heart rate 70s to 80s, WBC 15.2.  Creatinine about baseline 0.8.  UA rare bacteria.  Head CT -difficulty motion degraded, but no definite acute abnormality.  CT abdomen and pelvis ordered, but per ED provider suggestive of colitis.  Mildly elevated troponin at 0.04.  IV ciprofloxacin and metronidazole started in ED, hospitalist to admit for possible colitis, with vomiting. BSE requested.   Assessment / Plan / Recommendation Clinical Impression  Clinical swallow evaluation completed at bedside. RN reports poor attention and oral awareness earlier today when po medications were attempted. Pt reportedly not at her baseline for mental status. Pt intermittently followed simple one step commands during oral motor evaluation. Pt required verbal and tactile cues for oral awareness which improved after ice chip trials (poor labial closure initially and difficulty  switching from cup/spoon/straw). Pt without overt signs of aspiration/reduced airway protection with ice chips, thin water, and puree, however limited trials due to lethargy. SLP discussed with RN, recommend NPO except for ice chips/sips water and po medications crushed in puree until alertness and mental status improves. SLP will follow up tomorrow AM with plan for diet advancement if mental status/alertness improves. RN in agreement with plan of care. SLP will follow.    SLP Visit Diagnosis: Dysphagia, unspecified (R13.10)    Aspiration Risk  Mild aspiration risk    Diet Recommendation Ice chips PRN after oral care;Free water protocol after oral care;NPO except meds   Medication Administration: Crushed with puree Supervision: Staff to assist with self feeding Compensations: Slow rate;Small sips/bites Postural Changes: Seated upright at 90 degrees;Remain upright for at least 30 minutes after po intake    Other  Recommendations Oral Care Recommendations: Oral care prior to ice chip/H20;Staff/trained caregiver to provide oral care Other Recommendations: Clarify dietary restrictions   Follow up Recommendations 24 hour supervision/assistance      Frequency and Duration min 2x/week  1 week       Prognosis Prognosis for Safe Diet Advancement: Fair Barriers to Reach Goals: Cognitive deficits      Swallow Study   General Date of Onset: 05/29/18 HPI: [name] is a 85 y.o. female with medical history significant for dementia, hypertension, osteoporosis, generalized anxiety, pulmonary fibrosis who was brought to the ED with complaints of vomiting about an hour prior to presentation in the ED.  Was given Zofran without significant improvement hence presentation to the ED. The  time of my evaluation patient is confused, unable to give me history or tell me if she has pain. ED Course: Blood pressure systolic up to 850, temp 27.7, heart rate 70s to 80s, WBC 15.2.  Creatinine about baseline  0.8.  UA rare bacteria.  Head CT -difficulty motion degraded, but no definite acute abnormality.  CT abdomen and pelvis ordered, but per ED provider suggestive of colitis.  Mildly elevated troponin at 0.04.  IV ciprofloxacin and metronidazole started in ED, hospitalist to admit for possible colitis, with vomiting. BSE requested. Type of Study: Bedside Swallow Evaluation Previous Swallow Assessment: BSE December 2019 D3/thin Diet Prior to this Study: NPO Temperature Spikes Noted: No Respiratory Status: Room air History of Recent Intubation: No Behavior/Cognition: Alert;Requires cueing;Cooperative Oral Cavity Assessment: Within Functional Limits Oral Care Completed by SLP: Yes Oral Cavity - Dentition: Edentulous Vision: Impaired for self-feeding Self-Feeding Abilities: Total assist Patient Positioning: Upright in bed Baseline Vocal Quality: Normal Volitional Cough: Cognitively unable to elicit Volitional Swallow: Unable to elicit    Oral/Motor/Sensory Function Overall Oral Motor/Sensory Function: (grossly WNL, pt unable to follow all directions during OME)   Ice Chips Ice chips: Within functional limits Presentation: Spoon   Thin Liquid Thin Liquid: Impaired Presentation: Cup;Straw;Spoon Oral Phase Impairments: Reduced labial seal    Nectar Thick Nectar Thick Liquid: Not tested   Honey Thick Honey Thick Liquid: Not tested   Puree Puree: Within functional limits Presentation: Spoon   Solid     Solid: Not tested     Thank you,  Genene Churn, [place] 05/30/2018,12:55 PM

## 2018-05-30 NOTE — Progress Notes (Addendum)
Has been more alert as day progresses.  Did say "I'm cold" and took two sips of water through a straw and said "I don't want any more".  But , also, has been confused, looking at the ceiling and asking for the door to be closed and reaching up in the air.  PRN labetalol given for hypertension, IV.

## 2018-05-31 NOTE — Plan of Care (Signed)

## 2018-05-31 NOTE — Progress Notes (Signed)
Subjective: She is a little more alert but very confused.  She has been evaluated by speech and is okay to take sips and use some ice.  Objective: Vital signs in last 24 hours: Temp:  [97.5 F (36.4 C)-98.5 F (36.9 C)] 97.5 F (36.4 C) (05/14 0536) Pulse Rate:  [62-85] 66 (05/14 0647) Resp:  [17-19] 17 (05/14 0536) BP: (160-208)/(85-133) 188/98 (05/14 0647) SpO2:  [93 %-94 %] 93 % (05/14 0536) Weight change:  Last BM Date: 05/29/18  Intake/Output from previous day: 05/13 0701 - 05/14 0700 In: 1264.8 [I.V.:864.8; IV Piggyback:400] Out: -   PHYSICAL EXAM General appearance: alert and Confused Resp: clear to auscultation bilaterally Cardio: regular rate and rhythm, S1, S2 normal, no murmur, click, rub or gallop GI: soft, non-tender; bowel sounds normal; no masses,  no organomegaly Extremities: extremities normal, atraumatic, no cyanosis or edema  Lab Results:  Results for orders placed or performed during the hospital encounter of 05/29/18 (from the past 48 hour(s))  Comprehensive metabolic panel     Status: Abnormal   Collection Time: 05/29/18  9:14 AM  Result Value Ref Range   Sodium 138 135 - 145 mmol/L   Potassium 3.8 3.5 - 5.1 mmol/L   Chloride 101 98 - 111 mmol/L   CO2 25 22 - 32 mmol/L   Glucose, Bld 170 (H) 70 - 99 mg/dL   BUN 19 8 - 23 mg/dL   Creatinine, Ser 0.83 0.44 - 1.00 mg/dL   Calcium 8.6 (L) 8.9 - 10.3 mg/dL   Total Protein 8.1 6.5 - 8.1 g/dL   Albumin 4.3 3.5 - 5.0 g/dL   AST 28 15 - 41 U/L   ALT 20 0 - 44 U/L   Alkaline Phosphatase 102 38 - 126 U/L   Total Bilirubin 1.9 (H) 0.3 - 1.2 mg/dL   GFR calc non Af Amer 60 (L) >60 mL/min   GFR calc Af Amer >60 >60 mL/min   Anion gap 12 5 - 15    Comment: Performed at [facility], [address], [place], [place] [address]  Lipase, blood     Status: None   Collection Time: 05/29/18  9:14 AM  Result Value Ref Range   Lipase 28 11 - 51 U/L    Comment: Performed at [facility], [address]  [place], [place] [address]  CBC with Diff     Status: Abnormal   Collection Time: 05/29/18  9:14 AM  Result Value Ref Range   WBC 15.2 (H) 4.0 - 10.5 K/uL   RBC 4.63 3.87 - 5.11 MIL/uL   Hemoglobin 14.2 12.0 - 15.0 g/dL   HCT 44.2 36.0 - 46.0 %   MCV 95.5 80.0 - 100.0 fL   MCH 30.7 26.0 - 34.0 pg   MCHC 32.1 30.0 - 36.0 g/dL   RDW 13.2 11.5 - 15.5 %   Platelets 216 150 - 400 K/uL    Comment: PLATELET COUNT CONFIRMED BY SMEAR SPECIMEN CHECKED FOR CLOTS    nRBC 0.0 0.0 - 0.2 %   Neutrophils Relative % 91 %   Neutro Abs 13.8 (H) 1.7 - 7.7 K/uL   Lymphocytes Relative 3 %   Lymphs Abs 0.5 (L) 0.7 - 4.0 K/uL   Monocytes Relative 5 %   Monocytes Absolute 0.8 0.1 - 1.0 K/uL   Eosinophils Relative 0 %   Eosinophils Absolute 0.0 0.0 - 0.5 K/uL   Basophils Relative 0 %   Basophils Absolute 0.0 0.0 - 0.1 K/uL   Immature  Granulocytes 1 %   Abs Immature Granulocytes 0.07 0.00 - 0.07 K/uL    Comment: Performed at [facility], [address], [place], [place] [address]  Troponin I - ONCE - STAT     Status: Abnormal   Collection Time: 05/29/18  9:14 AM  Result Value Ref Range   Troponin I 0.04 (HH) <0.03 ng/mL    Comment: CRITICAL RESULT CALLED TO, READ BACK BY AND VERIFIED WITH: CARDWELL,L AT 9:55AM ON 05/29/18 BY [facility] Performed at [facility], [address] [place], [place] [address]   Magnesium     Status: None   Collection Time: 05/29/18  4:35 PM  Result Value Ref Range   Magnesium 1.8 1.7 - 2.4 mg/dL    Comment: Performed at [facility], [address], [place], [place] [address]  Troponin I - Now Then Q6H     Status: Abnormal   Collection Time: 05/29/18  4:35 PM  Result Value Ref Range   Troponin I 0.12 (HH) <0.03 ng/mL    Comment: CRITICAL VALUE NOTED.  VALUE IS CONSISTENT WITH PREVIOUSLY REPORTED AND CALLED VALUE. Performed at [facility], [address] [place], [place] [address]   MRSA PCR Screening     Status: None   Collection Time: 05/29/18  9:20 PM  Result Value  Ref Range   MRSA by PCR NEGATIVE NEGATIVE    Comment:        The GeneXpert MRSA Assay (FDA approved for NASAL specimens only), is one component of a comprehensive MRSA colonization surveillance program. It is not intended to diagnose MRSA infection nor to guide or monitor treatment for MRSA infections. Performed at [facility], [address], [place], [place] [address]   Troponin I - Now Then Q6H     Status: Abnormal   Collection Time: 05/29/18 11:58 PM  Result Value Ref Range   Troponin I 0.08 (HH) <0.03 ng/mL    Comment: CRITICAL VALUE NOTED.  VALUE IS CONSISTENT WITH PREVIOUSLY REPORTED AND CALLED VALUE. Performed at [facility], [address], [place], [place] [address]   Basic metabolic panel     Status: Abnormal   Collection Time: 05/30/18  4:14 AM  Result Value Ref Range   Sodium 140 135 - 145 mmol/L   Potassium 3.3 (L) 3.5 - 5.1 mmol/L   Chloride 105 98 - 111 mmol/L   CO2 24 22 - 32 mmol/L   Glucose, Bld 108 (H) 70 - 99 mg/dL   BUN 20 8 - 23 mg/dL   Creatinine, Ser 0.84 0.44 - 1.00 mg/dL   Calcium 8.4 (L) 8.9 - 10.3 mg/dL   GFR calc non Af Amer 59 (L) >60 mL/min   GFR calc Af Amer >60 >60 mL/min   Anion gap 11 5 - 15    Comment: Performed at [facility], [address] [place], [place] [address]  CBC     Status: Abnormal   Collection Time: 05/30/18  4:14 AM  Result Value Ref Range   WBC 11.8 (H) 4.0 - 10.5 K/uL   RBC 4.10 3.87 - 5.11 MIL/uL   Hemoglobin 12.2 12.0 - 15.0 g/dL   HCT 38.6 36.0 - 46.0 %   MCV 94.1 80.0 - 100.0 fL   MCH 29.8 26.0 - 34.0 pg   MCHC 31.6 30.0 - 36.0 g/dL   RDW 12.9 11.5 - 15.5 %   Platelets 258 150 - 400 K/uL   nRBC 0.0 0.0 - 0.2 %    Comment: Performed at [facility], [address] [place], [place] [address]    ABGS No results for input(s): PHART, PO2ART, TCO2, HCO3 in the last 72 hours.  Invalid input(s): PCO2 CULTURES Recent Results (from the past 240 hour(s))  SARS Coronavirus 2 (CEPHEID - Performed in [place]  hospital lab), Hosp Order     Status: None   Collection Time: 05/29/18  8:32 AM  Result Value Ref Range Status   SARS Coronavirus 2 NEGATIVE NEGATIVE Final    Comment: (NOTE) If result is NEGATIVE SARS-CoV-2 target nucleic acids are NOT DETECTED. The SARS-CoV-2 RNA is generally detectable in upper and lower  respiratory specimens during the acute phase of infection. The lowest  concentration of SARS-CoV-2 viral copies this assay can detect is 250  copies / mL. A negative result does not preclude SARS-CoV-2 infection  and should not be used as the sole basis for treatment or other  patient management decisions.  A negative result may occur with  improper specimen collection / handling, submission of specimen other  than nasopharyngeal swab, presence of viral mutation(s) within the  areas targeted by this assay, and inadequate number of viral copies  (<250 copies / mL). A negative result must be combined with clinical  observations, patient history, and epidemiological information. If result is POSITIVE SARS-CoV-2 target nucleic acids are DETECTED. The SARS-CoV-2 RNA is generally detectable in upper and lower  respiratory specimens dur ing the acute phase of infection.  Positive  results are indicative of active infection with SARS-CoV-2.  Clinical  correlation with patient history and other diagnostic information is  necessary to determine patient infection status.  Positive results do  not rule out bacterial infection or co-infection with other viruses. If result is PRESUMPTIVE POSTIVE SARS-CoV-2 nucleic acids MAY BE PRESENT.   A presumptive positive result was obtained on the submitted specimen  and confirmed on repeat testing.  While 2019 novel coronavirus  (SARS-CoV-2) nucleic acids may be present in the submitted sample  additional confirmatory testing may be necessary for epidemiological  and / or clinical management purposes  to differentiate between  SARS-CoV-2 and other  Sarbecovirus currently known to infect humans.  If clinically indicated additional testing with an alternate test  methodology [phone]) is advised. The SARS-CoV-2 RNA is generally  detectable in upper and lower respiratory sp ecimens during the acute  phase of infection. The expected result is Negative. Fact Sheet for Patients:  StrictlyIdeas.no Fact Sheet for Healthcare Providers: BankingDealers.co.za This test is not yet approved or cleared by the [place] FDA and has been authorized for detection and/or diagnosis of SARS-CoV-2 by FDA under an Emergency Use Authorization (EUA).  This EUA will remain in effect (meaning this test can be used) for the duration of the COVID-19 declaration under Section 564(b)(1) of the Act, 21 U.S.C. section 360bbb-3(b)(1), unless the authorization is terminated or revoked sooner. Performed at [facility], [address] [place], [place] [address]   MRSA PCR Screening     Status: None   Collection Time: 05/29/18  9:20 PM  Result Value Ref Range Status   MRSA by PCR NEGATIVE NEGATIVE Final    Comment:        The GeneXpert MRSA Assay (FDA approved for NASAL specimens only), is one component of a comprehensive MRSA colonization surveillance program. It is not intended to diagnose MRSA infection nor to guide or monitor treatment for MRSA infections. Performed at [facility], [address] [place], [place] [address]    Studies/Results: Ct Head Wo  Contrast  Result Date: 05/29/2018 CLINICAL DATA:  Altered level of consciousness, unexplained. History of acute delirium. History of dementia, and hypertension. EXAM: CT HEAD WITHOUT CONTRAST TECHNIQUE: Contiguous axial images were obtained from the base of the skull through the vertex without intravenous contrast. COMPARISON:  03/28/2018 FINDINGS: Significant motion degradation despite best attempts of the technologist. The study is of marginal  diagnostic utility. Brain: Advanced atrophy with multilobar infarcts including LEFT frontal, LEFT posterior limb internal capsule and white matter, RIGHT basal ganglia, and RIGHT occipital lobes, but no definite acute infarct, hemorrhage, mass lesion, hydrocephalus, or extra-axial fluid. Advanced chronic microvascular ischemic change is superimposed. Vascular: Calcification of the cavernous internal carotid arteries consistent with cerebrovascular atherosclerotic disease. No signs of intracranial large vessel occlusion. Skull: Calvarium grossly intact. Sinuses/Orbits: No acute finding. Other: None IMPRESSION: Significantly motion degraded scan demonstrates no definite acute finding. One can be fairly confident that no hemorrhage is present. Compared with priors, age advanced atrophy, chronic multilobar infarcts, and small vessel disease appear grossly stable. Electronically Signed   By: Staci Righter M.D.   On: 05/29/2018 09:30   Ct Abdomen Pelvis W Contrast  Result Date: 05/29/2018 CLINICAL DATA:  85 year old female with nausea vomiting and abdominal pain onset today. Hypertensive. EXAM: CT ABDOMEN AND PELVIS WITH CONTRAST TECHNIQUE: Multidetector CT imaging of the abdomen and pelvis was performed using the standard protocol following bolus administration of intravenous contrast. CONTRAST:  64mL OMNIPAQUE IOHEXOL 300 MG/ML  SOLN COMPARISON:  Noncontrast CT Abdomen and Pelvis 11/23/2015. FINDINGS: Lower chest: Trace bilateral pleural effusions. Lower lung volumes with possible mild septal thickening at the lung bases. Cardiomegaly has increased since 2017. No pericardial effusion. Calcified coronary artery and Calcified aortic atherosclerosis. Hepatobiliary: Surgically absent gallbladder as before. Stable and negative liver. Pancreas: Atrophied, otherwise negative. Spleen: Negative. Adrenals/Urinary Tract: Normal adrenal glands. Bulky chronic 15 millimeter left renal calculus is located in the lower pole today  with no hydronephrosis. Bilateral renal enhancement and contrast excretion is symmetric and within normal limits. There are occasional small benign appearing renal cysts. No hydroureter. Streak artifact in the pelvis due to hip arthroplasty, no abnormality of the urinary bladder identified. Stomach/Bowel: 7-8 centimeter stool ball in the rectum. No definite rectal inflammation. Severe chronic diverticulosis of the sigmoid colon, no active inflammation identified. Mild diverticulosis of the descending colon, where there is circumferential colonic wall thickening and hyper enhancement on series 2, image 42. This continues into the transverse colon which is largely decompressed and difficult to delineate from small bowel. The ascending colon also demonstrates some wall thickening although the cecum appears relatively spared. Negative terminal ileum. No dilated small bowel. Gastric ptosis, but otherwise negative stomach. No free air or free fluid identified. Vascular/Lymphatic: Extensive Aortoiliac calcified atherosclerosis. The major arterial structures appear patent. Portal venous system appears to be patent. No lymphadenopathy. Reproductive: Stable calcified uterine fibroids. Other: No pelvic free fluid. Musculoskeletal: Osteopenia. Chronic L2 and L5 compression fractures, the latter has mildly progressed since 2017. Previous mid sacral fracture suspected and stable. Chronic right hip arthroplasty. No acute osseous abnormality identified. IMPRESSION: 1. Circumferential wall thickening from the ascending to the descending colon suspicious for mild acute colitis. No obstruction, free fluid, or complicating features. 2. Severe diverticulosis of the sigmoid colon but no active inflammation identified. Stool ball in the rectum suggesting fecal impaction. 3. Trace layering pleural effusions, questionable pulmonary interstitial edema. Progressed cardiomegaly since 2017. No pericardial effusion. 4.  Aortic Atherosclerosis  (ICD10-I70.0). Electronically Signed   By: Genevie Ann  M.D.   On: 05/29/2018 11:41   Dg Abd Acute W/chest  Result Date: 05/29/2018 CLINICAL DATA:  Nausea and vomiting. Combative patient. EXAM: DG ABDOMEN ACUTE W/ 1V CHEST COMPARISON:  03/28/2018. FINDINGS: The heart is enlarged. There is mild vascular congestion. Skeletal osteopenia is noted. There is calcified thoracic and abdominal aorta. There is a moderate stool burden but no obstruction or free air. Cholecystectomy clips. Calcified fibroids. Previous RIGHT hip replacement. Chronic radiodense LEFT mid abdominal calcification, stable. IMPRESSION: 1. Cardiomegaly with mild vascular congestion. 2. Moderate stool burden without obstruction or free air. Electronically Signed   By: Staci Righter M.D.   On: 05/29/2018 09:40    Medications:  Prior to Admission:  Medications Prior to Admission  Medication Sig Dispense Refill Last Dose  . acetaminophen (TYLENOL) 500 MG tablet Take 1,000 mg by mouth at bedtime.    05/28/2018 at Unknown time  . bisacodyl (DULCOLAX) 10 MG suppository Place 1 suppository (10 mg total) rectally daily as needed for moderate constipation. 12 suppository 0 unknown  . conjugated estrogens (PREMARIN) vaginal cream Place 1 Applicatorful vaginally at bedtime.   05/28/2018 at Unknown time  . doxycycline (MONODOX) 100 MG capsule Take 100 mg by mouth 2 (two) times daily.   05/29/2018 at Unknown time  . LORazepam (ATIVAN) 0.5 MG tablet Take 1 tablet (0.5 mg total) by mouth every 4 (four) hours as needed for anxiety or sedation. 30 tablet 0 unknown  . metoprolol tartrate (LOPRESSOR) 25 MG tablet Take 1 tablet (25 mg total) by mouth 2 (two) times daily. 60 tablet 5 05/29/2018 at 0800  . mirabegron ER (MYRBETRIQ) 25 MG TB24 tablet Take 25 mg by mouth every morning.    05/29/2018 at Unknown time  . sertraline (ZOLOFT) 50 MG tablet Take 50 mg by mouth every morning.    05/29/2018 at Unknown time  . traZODone (DESYREL) 50 MG tablet Take 50 mg by mouth  at bedtime.   05/28/2018 at Unknown time   Scheduled: . amLODipine  10 mg Oral Daily  . benazepril  20 mg Oral Daily  . conjugated estrogens  1 Applicatorful Vaginal QHS  . enoxaparin (LOVENOX) injection  30 mg Subcutaneous Q24H  . metoprolol tartrate  25 mg Oral BID  . mirabegron ER  25 mg Oral q morning - 10a  . sertraline  50 mg Oral q morning - 10a   Continuous: . 0.9 % NaCl with KCl 20 mEq / L 75 mL/hr at 05/31/18 0500  . cefTRIAXone (ROCEPHIN)  IV Stopped (05/30/18 2301)  . metronidazole 500 mg (05/31/18 0602)   XTA:[id number] **OR** acetaminophen, labetalol, ondansetron **OR** ondansetron (ZOFRAN) IV, polyethylene glycol  Assesment: She was admitted with colitis.  She has dementia at baseline she is very confused.  She is on antibiotics.  We are awaiting seeing if she can eat more now.  She does have DNR status which is appropriate Active Problems:   Colitis   Pressure injury of skin    Plan: Continue antibiotics etc.    LOS: 1 day   Alonza Bogus 05/31/2018, 8:37 AM

## 2018-05-31 NOTE — Progress Notes (Signed)
  Speech Language Pathology Treatment: Dysphagia  Patient Details Name: [name] MRN: [id number] DOB: [birth year] Today's Date: 05/31/2018 Time: 1115-5208 SLP Time Calculation (min) (ACUTE ONLY): 25 min  Assessment / Plan / Recommendation Clinical Impression  Pt seen for ongoing diagnostic dysphagia intervention following BSE completed yesterday. Pt is much more alert and talkative this date. She is asking about the obituaries and funeral services for the week. She consumed straw sips thin water with SLP hand over hand assist for feeding without overt signs or symptoms of aspiration, vocal quality remained clear. Pt self fed graham crackers with some mild pocketing noted in right buccal cavity which cleared with liquid wash. Recommend advancing textures to D3/mech soft due to edentulous status and thin liquids via cup/straw and po medications whole in puree or with water. Above discussed with RN. SLP will sign off. Reconsult as indicated.    HPI HPI: [name] is a 85 y.o. female with medical history significant for dementia, hypertension, osteoporosis, generalized anxiety, pulmonary fibrosis who was brought to the ED with complaints of vomiting about an hour prior to presentation in the ED.  Was given Zofran without significant improvement hence presentation to the ED. The time of my evaluation patient is confused, unable to give me history or tell me if she has pain. ED Course: Blood pressure systolic up to 022, temp 33.6, heart rate 70s to 80s, WBC 15.2.  Creatinine about baseline 0.8.  UA rare bacteria.  Head CT -difficulty motion degraded, but no definite acute abnormality.  CT abdomen and pelvis ordered, but per ED provider suggestive of colitis.  Mildly elevated troponin at 0.04.  IV ciprofloxacin and metronidazole started in ED, hospitalist to admit for possible colitis, with vomiting. BSE requested.      SLP Plan  Continue with current plan of care;Discharge SLP treatment due to  (comment)       Recommendations  Diet recommendations: Dysphagia 3 (mechanical soft);Thin liquid Liquids provided via: Cup;Straw Medication Administration: Whole meds with puree Supervision: Patient able to self feed;Staff to assist with self feeding;Intermittent supervision to cue for compensatory strategies Compensations: Slow rate;Small sips/bites Postural Changes and/or Swallow Maneuvers: Seated upright 90 degrees;Upright 30-60 min after meal                Oral Care Recommendations: Oral care BID;Staff/trained caregiver to provide oral care Follow up Recommendations: 24 hour supervision/assistance SLP Visit Diagnosis: Dysphagia, unspecified (R13.10) Plan: Continue with current plan of care;Discharge SLP treatment due to (comment)       Thank you,  Genene Churn, [place]                 [place] 05/31/2018, 11:31 AM

## 2018-05-31 NOTE — TOC Initial Note (Signed)
Transition of Care [facility]) - Initial/Assessment Note    Patient Details  Name: [name] MRN: [id number] Date of Birth: [birth year]  Transition of Care [facility]) CM/SW Contact:    Hannan Tetzlaff, Chauncey Reading, RN Phone Number: 05/31/2018, 11:50 AM  Clinical Narrative:   Admitted with colitis. From [facility]. Discussed patient with Butch Penny of [employer]. Butch Penny reports that since patient has been on oxygen, she seems to have given up and frequently says that she wants "to die". She reports patient has stopped walking as she used to walk independently and now needs more assistance.   Updated Butch Penny on patient's status as of today, more alert but still very confused and that she is being treated for Colitis.   TOC will follow patient.              Expected Discharge Plan: Assisted Living Barriers to Discharge: No Barriers Identified   Patient Goals and CMS Choice   CMS Medicare.gov Compare Post Acute Care list provided to:: Patient Represenative (must comment)([facility]) Choice offered to / list presented to : NA  Expected Discharge Plan and Services Expected Discharge Plan: Assisted Living   Discharge Planning Services: CM Consult     Prior Living Arrangements/Services     Patient language and need for interpreter reviewed:: Yes Do you feel safe going back to the place where you live?: Yes      Need for Family Participation in Patient Care: Yes (Comment) Care giver support system in place?: Yes (comment)   Criminal Activity/Legal Involvement Pertinent to Current Situation/Hospitalization: No - Comment as needed  Activities of Daily Living Home Assistive Devices/Equipment: Walker (specify type) ADL Screening (condition at time of admission) Patient's cognitive ability adequate to safely complete daily activities?: No Is the patient deaf or have difficulty hearing?: Yes Does the patient have difficulty seeing, even when wearing glasses/contacts?: Yes Does the patient have  difficulty concentrating, remembering, or making decisions?: Yes Patient able to express need for assistance with ADLs?: No Does the patient have difficulty dressing or bathing?: Yes Independently performs ADLs?: No Communication: Independent Dressing (OT): Needs assistance Is this a change from baseline?: Change from baseline, expected to last <3days Grooming: Needs assistance Is this a change from baseline?: Pre-admission baseline Feeding: Needs assistance Is this a change from baseline?: Pre-admission baseline Bathing: Needs assistance Is this a change from baseline?: Pre-admission baseline Toileting: Needs assistance Is this a change from baseline?: Pre-admission baseline In/Out Bed: Needs assistance Is this a change from baseline?: Pre-admission baseline Walks in Home: Needs assistance Is this a change from baseline?: Pre-admission baseline Does the patient have difficulty walking or climbing stairs?: No Weakness of Legs: Both Weakness of Arms/Hands: Both  Permission Sought/Granted Permission sought to share information with : Chartered certified accountant granted to share information with : Yes, Verbal Permission Granted  Share Information with NAME: [facility]           Emotional Assessment Appearance:: Appears stated age     Orientation: : Oriented to Self Alcohol / Substance Use: Not Applicable Psych Involvement: No (comment)  Admission diagnosis:  Colitis [K52.9] Elevated troponin [R79.89] Patient Active Problem List   Diagnosis Date Noted  . Colitis 05/29/2018  . Pressure injury of skin 05/29/2018  . Interstitial pulmonary disease ([place]) 03/29/2018  . Volume overload 03/29/2018  . Hypoxia 03/28/2018  . Dementia ([place]) 03/28/2018  . Goals of care, counseling/discussion   . Encounter for hospice care discussion   . Palliative care by specialist   .  Terminal care   . Agitation   . Dyspnea   . HCAP (healthcare-associated pneumonia)  02/03/2018  . AKI (acute kidney injury) ([place]) 02/03/2018  . Dehydration 02/03/2018  . Acute respiratory failure with hypoxia ([place]) 02/03/2018  . Pneumonia 12/17/2017  . Altered mental status 07/10/2016  . Urinary incontinence 07/10/2016  . History of CVA (cerebrovascular accident) - by CT scan 07/10/2016  . Compression fracture of L2 lumbar vertebra (H[facility]) 11/25/2015  . Compression fracture of spine, non-traumatic ([place]) 11/25/2015  . Osteoporosis 11/25/2015  . Peripheral arterial disease ([place]) 11/25/2015  . Pyelonephritis, acute 11/23/2015  . UTI (urinary tract infection) 11/23/2015  . Community acquired pneumonia 07/11/2014  . Urinary tract infection, site not specified 03/01/2012  . Acute delirium 03/01/2012  . Essential hypertension, benign 03/01/2012  . Hypokalemia 03/01/2012   PCP:  Sinda Du, MD Pharmacy:   [place] [place], [place] [address] Phone: [phone] Fax: [phone]     Social Determinants of Health (SDOH) Interventions    Readmission Risk Interventions Readmission Risk Prevention Plan 05/31/2018  Transportation Screening Complete  Palliative Care Screening Not Applicable  Medication Review (RN Care Manager) Complete  Some recent data might be hidden

## 2018-06-01 DIAGNOSIS — F411 Generalized anxiety disorder: Secondary | ICD-10-CM | POA: Diagnosis not present

## 2018-06-01 DIAGNOSIS — F0280 Dementia in other diseases classified elsewhere without behavioral disturbance: Secondary | ICD-10-CM | POA: Diagnosis not present

## 2018-06-01 NOTE — Progress Notes (Signed)
Blister noted to R buttock.  Intact.  Sacrum red, but blanches.

## 2018-06-01 NOTE — Progress Notes (Signed)
Subjective: She is still very confused.  More alert however.  Objective: Vital signs in last 24 hours: Temp:  [98.1 F (36.7 C)-98.8 F (37.1 C)] 98.7 F (37.1 C) (05/15 0510) Pulse Rate:  [65] 65 (05/15 0510) Resp:  [19-20] 20 (05/15 0510) BP: (141-189)/(63-78) 189/78 (05/15 0510) SpO2:  [91 %-94 %] 94 % (05/15 0510) Weight change:  Last BM Date: 05/31/18  Intake/Output from previous day: 05/14 0701 - 05/15 0700 In: 1521.4 [P.O.:360; I.V.:661.4; IV Piggyback:500] Out: 1550 [Urine:1550]  PHYSICAL EXAM General appearance: alert and Confused Resp: clear to auscultation bilaterally Cardio: regular rate and rhythm, S1, S2 normal, no murmur, click, rub or gallop GI: soft, non-tender; bowel sounds normal; no masses,  no organomegaly Extremities: extremities normal, atraumatic, no cyanosis or edema  Lab Results:  No results found for this or any previous visit (from the past 48 hour(s)).  ABGS No results for input(s): PHART, PO2ART, TCO2, HCO3 in the last 72 hours.  Invalid input(s): PCO2 CULTURES Recent Results (from the past 240 hour(s))  SARS Coronavirus 2 (CEPHEID - Performed in [facility] hospital lab), Hosp Order     Status: None   Collection Time: 05/29/18  8:32 AM  Result Value Ref Range Status   SARS Coronavirus 2 NEGATIVE NEGATIVE Final    Comment: (NOTE) If result is NEGATIVE SARS-CoV-2 target nucleic acids are NOT DETECTED. The SARS-CoV-2 RNA is generally detectable in upper and lower  respiratory specimens during the acute phase of infection. The lowest  concentration of SARS-CoV-2 viral copies this assay can detect is 250  copies / mL. A negative result does not preclude SARS-CoV-2 infection  and should not be used as the sole basis for treatment or other  patient management decisions.  A negative result may occur with  improper specimen collection / handling, submission of specimen other  than nasopharyngeal swab, presence of viral mutation(s) within the   areas targeted by this assay, and inadequate number of viral copies  (<250 copies / mL). A negative result must be combined with clinical  observations, patient history, and epidemiological information. If result is POSITIVE SARS-CoV-2 target nucleic acids are DETECTED. The SARS-CoV-2 RNA is generally detectable in upper and lower  respiratory specimens dur ing the acute phase of infection.  Positive  results are indicative of active infection with SARS-CoV-2.  Clinical  correlation with patient history and other diagnostic information is  necessary to determine patient infection status.  Positive results do  not rule out bacterial infection or co-infection with other viruses. If result is PRESUMPTIVE POSTIVE SARS-CoV-2 nucleic acids MAY BE PRESENT.   A presumptive positive result was obtained on the submitted specimen  and confirmed on repeat testing.  While 2019 novel coronavirus  (SARS-CoV-2) nucleic acids may be present in the submitted sample  additional confirmatory testing may be necessary for epidemiological  and / or clinical management purposes  to differentiate between  SARS-CoV-2 and other Sarbecovirus currently known to infect humans.  If clinically indicated additional testing with an alternate test  methodology [phone]) is advised. The SARS-CoV-2 RNA is generally  detectable in upper and lower respiratory sp ecimens during the acute  phase of infection. The expected result is Negative. Fact Sheet for Patients:  StrictlyIdeas.no Fact Sheet for Healthcare Providers: BankingDealers.co.za This test is not yet approved or cleared by the [place] FDA and has been authorized for detection and/or diagnosis of SARS-CoV-2 by FDA under an Emergency Use Authorization (EUA).  This EUA will remain in effect (meaning  this test can be used) for the duration of the COVID-19 declaration under Section 564(b)(1) of the Act, 21  U.S.C. section 360bbb-3(b)(1), unless the authorization is terminated or revoked sooner. Performed at [facility], [address] [place], [place] [address]   MRSA PCR Screening     Status: None   Collection Time: 05/29/18  9:20 PM  Result Value Ref Range Status   MRSA by PCR NEGATIVE NEGATIVE Final    Comment:        The GeneXpert MRSA Assay (FDA approved for NASAL specimens only), is one component of a comprehensive MRSA colonization surveillance program. It is not intended to diagnose MRSA infection nor to guide or monitor treatment for MRSA infections. Performed at [facility], [address] [place], [place] [address]    Studies/Results: No results found.  Medications:  Prior to Admission:  Medications Prior to Admission  Medication Sig Dispense Refill Last Dose  . acetaminophen (TYLENOL) 500 MG tablet Take 1,000 mg by mouth at bedtime.    05/28/2018 at Unknown time  . bisacodyl (DULCOLAX) 10 MG suppository Place 1 suppository (10 mg total) rectally daily as needed for moderate constipation. 12 suppository 0 unknown  . conjugated estrogens (PREMARIN) vaginal cream Place 1 Applicatorful vaginally at bedtime.   05/28/2018 at Unknown time  . doxycycline (MONODOX) 100 MG capsule Take 100 mg by mouth 2 (two) times daily.   05/29/2018 at Unknown time  . LORazepam (ATIVAN) 0.5 MG tablet Take 1 tablet (0.5 mg total) by mouth every 4 (four) hours as needed for anxiety or sedation. 30 tablet 0 unknown  . metoprolol tartrate (LOPRESSOR) 25 MG tablet Take 1 tablet (25 mg total) by mouth 2 (two) times daily. 60 tablet 5 05/29/2018 at 0800  . mirabegron ER (MYRBETRIQ) 25 MG TB24 tablet Take 25 mg by mouth every morning.    05/29/2018 at Unknown time  . sertraline (ZOLOFT) 50 MG tablet Take 50 mg by mouth every morning.    05/29/2018 at Unknown time  . traZODone (DESYREL) 50 MG tablet Take 50 mg by mouth at bedtime.   05/28/2018 at Unknown time   Scheduled: . amLODipine  10 mg Oral Daily  .  benazepril  20 mg Oral Daily  . conjugated estrogens  1 Applicatorful Vaginal QHS  . enoxaparin (LOVENOX) injection  30 mg Subcutaneous Q24H  . metoprolol tartrate  25 mg Oral BID  . mirabegron ER  25 mg Oral q morning - 10a  . sertraline  50 mg Oral q morning - 10a   Continuous: . cefTRIAXone (ROCEPHIN)  IV 2 g (05/31/18 2156)  . metronidazole 500 mg (06/01/18 0600)   AOZ:HYQMVHQIONGEX **OR** acetaminophen, labetalol, ondansetron **OR** ondansetron (ZOFRAN) IV, polyethylene glycol  Assesment: She was admitted with colitis.  She is doing better.  She is still significantly confused.  She remains on IV antibiotics. Active Problems:   Colitis   Pressure injury of skin    Plan: Continue current treatments    LOS: 2 days   [name] 06/01/2018, 9:03 AM

## 2018-06-01 NOTE — Care Management Important Message (Signed)
Important Message  Patient Details  Name: [name] MRN: [id number] Date of Birth: 18-Nov-1920   Medicare Important Message Given:  Yes    Tommy Medal 06/01/2018, 1:10 PM

## 2018-06-02 NOTE — Plan of Care (Signed)
Patient is progressing with care plan. 

## 2018-06-02 NOTE — Progress Notes (Signed)
Subjective: She is overall about the same.  She is still confused.  Tolerating diet fairly well.  Her blood pressure is still up and down.  Objective: Vital signs in last 24 hours: Temp:  [97.9 F (36.6 C)-98.5 F (36.9 C)] 97.9 F (36.6 C) (05/16 0622) Pulse Rate:  [59-68] 59 (05/16 0622) Resp:  [18-19] 18 (05/16 0622) BP: (157-178)/(76-82) 178/82 (05/16 0622) SpO2:  [93 %-97 %] 96 % (05/16 0800) Weight change:  Last BM Date: 05/31/18  Intake/Output from previous day: 05/15 0701 - 05/16 0700 In: 440 [P.O.:240; IV Piggyback:200] Out: 200 [Urine:200]  PHYSICAL EXAM General appearance: alert and Confused Resp: clear to auscultation bilaterally Cardio: regular rate and rhythm, S1, S2 normal, no murmur, click, rub or gallop GI: Minimal if any tenderness Extremities: extremities normal, atraumatic, no cyanosis or edema  Lab Results:  No results found for this or any previous visit (from the past 48 hour(s)).  ABGS No results for input(s): PHART, PO2ART, TCO2, HCO3 in the last 72 hours.  Invalid input(s): PCO2 CULTURES Recent Results (from the past 240 hour(s))  SARS Coronavirus 2 (CEPHEID - Performed in [place] hospital lab), Hosp Order     Status: None   Collection Time: 05/29/18  8:32 AM  Result Value Ref Range Status   SARS Coronavirus 2 NEGATIVE NEGATIVE Final    Comment: (NOTE) If result is NEGATIVE SARS-CoV-2 target nucleic acids are NOT DETECTED. The SARS-CoV-2 RNA is generally detectable in upper and lower  respiratory specimens during the acute phase of infection. The lowest  concentration of SARS-CoV-2 viral copies this assay can detect is 250  copies / mL. A negative result does not preclude SARS-CoV-2 infection  and should not be used as the sole basis for treatment or other  patient management decisions.  A negative result may occur with  improper specimen collection / handling, submission of specimen other  than nasopharyngeal swab, presence of viral  mutation(s) within the  areas targeted by this assay, and inadequate number of viral copies  (<250 copies / mL). A negative result must be combined with clinical  observations, patient history, and epidemiological information. If result is POSITIVE SARS-CoV-2 target nucleic acids are DETECTED. The SARS-CoV-2 RNA is generally detectable in upper and lower  respiratory specimens dur ing the acute phase of infection.  Positive  results are indicative of active infection with SARS-CoV-2.  Clinical  correlation with patient history and other diagnostic information is  necessary to determine patient infection status.  Positive results do  not rule out bacterial infection or co-infection with other viruses. If result is PRESUMPTIVE POSTIVE SARS-CoV-2 nucleic acids MAY BE PRESENT.   A presumptive positive result was obtained on the submitted specimen  and confirmed on repeat testing.  While 2019 novel coronavirus  (SARS-CoV-2) nucleic acids may be present in the submitted sample  additional confirmatory testing may be necessary for epidemiological  and / or clinical management purposes  to differentiate between  SARS-CoV-2 and other Sarbecovirus currently known to infect humans.  If clinically indicated additional testing with an alternate test  methodology [phone]) is advised. The SARS-CoV-2 RNA is generally  detectable in upper and lower respiratory sp ecimens during the acute  phase of infection. The expected result is Negative. Fact Sheet for Patients:  StrictlyIdeas.no Fact Sheet for Healthcare Providers: BankingDealers.co.za This test is not yet approved or cleared by the [place] FDA and has been authorized for detection and/or diagnosis of SARS-CoV-2 by FDA under an Emergency Use Authorization (  EUA).  This EUA will remain in effect (meaning this test can be used) for the duration of the COVID-19 declaration under Section 564(b)(1)  of the Act, 21 U.S.C. section 360bbb-3(b)(1), unless the authorization is terminated or revoked sooner. Performed at [facility], [address] [place], [place] [address]   MRSA PCR Screening     Status: None   Collection Time: 05/29/18  9:20 PM  Result Value Ref Range Status   MRSA by PCR NEGATIVE NEGATIVE Final    Comment:        The GeneXpert MRSA Assay (FDA approved for NASAL specimens only), is one component of a comprehensive MRSA colonization surveillance program. It is not intended to diagnose MRSA infection nor to guide or monitor treatment for MRSA infections. Performed at [facility], [address] [place], [place] [address]    Studies/Results: No results found.  Medications:  Prior to Admission:  Medications Prior to Admission  Medication Sig Dispense Refill Last Dose  . acetaminophen (TYLENOL) 500 MG tablet Take 1,000 mg by mouth at bedtime.    05/28/2018 at Unknown time  . bisacodyl (DULCOLAX) 10 MG suppository Place 1 suppository (10 mg total) rectally daily as needed for moderate constipation. 12 suppository 0 unknown  . conjugated estrogens (PREMARIN) vaginal cream Place 1 Applicatorful vaginally at bedtime.   05/28/2018 at Unknown time  . doxycycline (MONODOX) 100 MG capsule Take 100 mg by mouth 2 (two) times daily.   05/29/2018 at Unknown time  . LORazepam (ATIVAN) 0.5 MG tablet Take 1 tablet (0.5 mg total) by mouth every 4 (four) hours as needed for anxiety or sedation. 30 tablet 0 unknown  . metoprolol tartrate (LOPRESSOR) 25 MG tablet Take 1 tablet (25 mg total) by mouth 2 (two) times daily. 60 tablet 5 05/29/2018 at 0800  . mirabegron ER (MYRBETRIQ) 25 MG TB24 tablet Take 25 mg by mouth every morning.    05/29/2018 at Unknown time  . sertraline (ZOLOFT) 50 MG tablet Take 50 mg by mouth every morning.    05/29/2018 at Unknown time  . traZODone (DESYREL) 50 MG tablet Take 50 mg by mouth at bedtime.   05/28/2018 at Unknown time   Scheduled: . amLODipine  10 mg  Oral Daily  . benazepril  20 mg Oral Daily  . conjugated estrogens  1 Applicatorful Vaginal QHS  . enoxaparin (LOVENOX) injection  30 mg Subcutaneous Q24H  . metoprolol tartrate  25 mg Oral BID  . mirabegron ER  25 mg Oral q morning - 10a  . sertraline  50 mg Oral q morning - 10a   Continuous: . cefTRIAXone (ROCEPHIN)  IV Stopped (06/01/18 2137)  . metronidazole 500 mg (06/02/18 9024)   OXB:[id number] **OR** acetaminophen, labetalol, ondansetron **OR** ondansetron (ZOFRAN) IV, polyethylene glycol  Assesment: She was admitted with colitis and she is being treated for that.  She is tolerating treatment so far.  She has hypertension and her blood pressure has been up and down.  She is back on oral meds plus IV as needed  She has a pressure injury of the skin on her buttocks  She has dementia at baseline and is more confused than usual although she is confused pretty much all the time Active Problems:   Colitis   Pressure injury of skin    Plan: Continue treatments.    LOS: 3 days   [name] 06/02/2018, 9:03 AM

## 2018-06-03 MED ORDER — AMOXICILLIN-POT CLAVULANATE 875-125 MG PO TABS
1.0000 | ORAL_TABLET | Freq: Two times a day (BID) | ORAL | Status: DC
  Administered 2018-06-03 – 2018-06-04 (×3): 1 via ORAL
  Filled 2018-06-03 (×3): qty 1

## 2018-06-03 MED ORDER — METRONIDAZOLE 500 MG PO TABS
500.0000 mg | ORAL_TABLET | Freq: Three times a day (TID) | ORAL | Status: DC
  Administered 2018-06-03 – 2018-06-04 (×4): 500 mg via ORAL
  Filled 2018-06-03 (×4): qty 1

## 2018-06-03 NOTE — Progress Notes (Signed)
Subjective: She is more alert.  She is still confused.  No complaints.  She is very hard of hearing and does not see well so communication is difficult  Objective: Vital signs in last 24 hours: Temp:  [98.3 F (36.8 C)-98.7 F (37.1 C)] 98.3 F (36.8 C) (05/17 0549) Pulse Rate:  [57-69] 57 (05/17 0549) Resp:  [15-18] 18 (05/17 0549) BP: (129-160)/(52-69) 160/69 (05/17 0549) SpO2:  [95 %-97 %] 96 % (05/17 0734) Weight change:  Last BM Date: 06/01/18  Intake/Output from previous day: 05/16 0701 - 05/17 0700 In: 120 [P.O.:120] Out: 300 [Urine:300]  PHYSICAL EXAM General appearance: alert and Confused Resp: clear to auscultation bilaterally Cardio: regular rate and rhythm, S1, S2 normal, no murmur, click, rub or gallop GI: soft, non-tender; bowel sounds normal; no masses,  no organomegaly Extremities: extremities normal, atraumatic, no cyanosis or edema  Lab Results:  No results found for this or any previous visit (from the past 48 hour(s)).  ABGS No results for input(s): PHART, PO2ART, TCO2, HCO3 in the last 72 hours.  Invalid input(s): PCO2 CULTURES Recent Results (from the past 240 hour(s))  SARS Coronavirus 2 (CEPHEID - Performed in [place] hospital lab), Hosp Order     Status: None   Collection Time: 05/29/18  8:32 AM  Result Value Ref Range Status   SARS Coronavirus 2 NEGATIVE NEGATIVE Final    Comment: (NOTE) If result is NEGATIVE SARS-CoV-2 target nucleic acids are NOT DETECTED. The SARS-CoV-2 RNA is generally detectable in upper and lower  respiratory specimens during the acute phase of infection. The lowest  concentration of SARS-CoV-2 viral copies this assay can detect is 250  copies / mL. A negative result does not preclude SARS-CoV-2 infection  and should not be used as the sole basis for treatment or other  patient management decisions.  A negative result may occur with  improper specimen collection / handling, submission of specimen other  than  nasopharyngeal swab, presence of viral mutation(s) within the  areas targeted by this assay, and inadequate number of viral copies  (<250 copies / mL). A negative result must be combined with clinical  observations, patient history, and epidemiological information. If result is POSITIVE SARS-CoV-2 target nucleic acids are DETECTED. The SARS-CoV-2 RNA is generally detectable in upper and lower  respiratory specimens dur ing the acute phase of infection.  Positive  results are indicative of active infection with SARS-CoV-2.  Clinical  correlation with patient history and other diagnostic information is  necessary to determine patient infection status.  Positive results do  not rule out bacterial infection or co-infection with other viruses. If result is PRESUMPTIVE POSTIVE SARS-CoV-2 nucleic acids MAY BE PRESENT.   A presumptive positive result was obtained on the submitted specimen  and confirmed on repeat testing.  While 2019 novel coronavirus  (SARS-CoV-2) nucleic acids may be present in the submitted sample  additional confirmatory testing may be necessary for epidemiological  and / or clinical management purposes  to differentiate between  SARS-CoV-2 and other Sarbecovirus currently known to infect humans.  If clinically indicated additional testing with an alternate test  methodology [phone]) is advised. The SARS-CoV-2 RNA is generally  detectable in upper and lower respiratory sp ecimens during the acute  phase of infection. The expected result is Negative. Fact Sheet for Patients:  StrictlyIdeas.no Fact Sheet for Healthcare Providers: BankingDealers.co.za This test is not yet approved or cleared by the [place] FDA and has been authorized for detection and/or diagnosis of SARS-CoV-2  by FDA under an Emergency Use Authorization (EUA).  This EUA will remain in effect (meaning this test can be used) for the duration of  the COVID-19 declaration under Section 564(b)(1) of the Act, 21 U.S.C. section 360bbb-3(b)(1), unless the authorization is terminated or revoked sooner. Performed at [facility], [address], [place], [place] [address]   MRSA PCR Screening     Status: None   Collection Time: 05/29/18  9:20 PM  Result Value Ref Range Status   MRSA by PCR NEGATIVE NEGATIVE Final    Comment:        The GeneXpert MRSA Assay (FDA approved for NASAL specimens only), is one component of a comprehensive MRSA colonization surveillance program. It is not intended to diagnose MRSA infection nor to guide or monitor treatment for MRSA infections. Performed at [facility], [address], [place], [place] [address]    Studies/Results: No results found.  Medications:  Prior to Admission:  Medications Prior to Admission  Medication Sig Dispense Refill Last Dose  . acetaminophen (TYLENOL) 500 MG tablet Take 1,000 mg by mouth at bedtime.    05/28/2018 at Unknown time  . bisacodyl (DULCOLAX) 10 MG suppository Place 1 suppository (10 mg total) rectally daily as needed for moderate constipation. 12 suppository 0 unknown  . conjugated estrogens (PREMARIN) vaginal cream Place 1 Applicatorful vaginally at bedtime.   05/28/2018 at Unknown time  . doxycycline (MONODOX) 100 MG capsule Take 100 mg by mouth 2 (two) times daily.   05/29/2018 at Unknown time  . LORazepam (ATIVAN) 0.5 MG tablet Take 1 tablet (0.5 mg total) by mouth every 4 (four) hours as needed for anxiety or sedation. 30 tablet 0 unknown  . metoprolol tartrate (LOPRESSOR) 25 MG tablet Take 1 tablet (25 mg total) by mouth 2 (two) times daily. 60 tablet 5 05/29/2018 at 0800  . mirabegron ER (MYRBETRIQ) 25 MG TB24 tablet Take 25 mg by mouth every morning.    05/29/2018 at Unknown time  . sertraline (ZOLOFT) 50 MG tablet Take 50 mg by mouth every morning.    05/29/2018 at Unknown time  . traZODone (DESYREL) 50 MG tablet Take 50 mg by mouth at bedtime.   05/28/2018  at Unknown time   Scheduled: . amLODipine  10 mg Oral Daily  . benazepril  20 mg Oral Daily  . conjugated estrogens  1 Applicatorful Vaginal QHS  . enoxaparin (LOVENOX) injection  30 mg Subcutaneous Q24H  . metoprolol tartrate  25 mg Oral BID  . mirabegron ER  25 mg Oral q morning - 10a  . sertraline  50 mg Oral q morning - 10a   Continuous: . cefTRIAXone (ROCEPHIN)  IV 2 g (06/02/18 2201)  . metronidazole 500 mg (06/03/18 0601)   RSW:[id number] **OR** acetaminophen, labetalol, ondansetron **OR** ondansetron (ZOFRAN) IV, polyethylene glycol  Assesment: She was admitted with colitis.  She has improved.  She is tolerating diet.  No more fever.  She has pressure injury to her skin and that is better  She has hypertension and that is been a little more difficult to control  She has had trouble with anxiety and depression and she is on sertraline  She has overactive bladder and that is being treated  She has dementia which is pretty severe and progressing Active Problems:   Colitis   Pressure injury of skin    Plan: Discontinue IV antibiotics.  Switch to oral.  Check labs in the morning.  Potential return to her assisted living facility tomorrow  LOS: 4 days   [name] 06/03/2018, 8:42 AM

## 2018-06-03 NOTE — Progress Notes (Signed)
Patient having difficulty swallowing soft diet. Will notify MD.

## 2018-06-04 DIAGNOSIS — F0280 Dementia in other diseases classified elsewhere without behavioral disturbance: Secondary | ICD-10-CM | POA: Diagnosis not present

## 2018-06-04 DIAGNOSIS — F411 Generalized anxiety disorder: Secondary | ICD-10-CM | POA: Diagnosis not present

## 2018-06-04 LAB — CBC WITH DIFFERENTIAL/PLATELET
Abs Immature Granulocytes: 0.03 10*3/uL (ref 0.00–0.07)
Basophils Absolute: 0.1 10*3/uL (ref 0.0–0.1)
Basophils Relative: 1 %
Eosinophils Absolute: 0.2 10*3/uL (ref 0.0–0.5)
Eosinophils Relative: 2 %
HCT: 42.6 % (ref 36.0–46.0)
Hemoglobin: 13.9 g/dL (ref 12.0–15.0)
Immature Granulocytes: 0 %
Lymphocytes Relative: 17 %
Lymphs Abs: 1.5 10*3/uL (ref 0.7–4.0)
MCH: 29.9 pg (ref 26.0–34.0)
MCHC: 32.6 g/dL (ref 30.0–36.0)
MCV: 91.6 fL (ref 80.0–100.0)
Monocytes Absolute: 0.9 10*3/uL (ref 0.1–1.0)
Monocytes Relative: 10 %
Neutro Abs: 6 10*3/uL (ref 1.7–7.7)
Neutrophils Relative %: 70 %
Platelets: 275 10*3/uL (ref 150–400)
RBC: 4.65 MIL/uL (ref 3.87–5.11)
RDW: 13.1 % (ref 11.5–15.5)
WBC: 8.6 10*3/uL (ref 4.0–10.5)
nRBC: 0 % (ref 0.0–0.2)

## 2018-06-04 LAB — BASIC METABOLIC PANEL
Anion gap: 10 (ref 5–15)
BUN: 13 mg/dL (ref 8–23)
CO2: 27 mmol/L (ref 22–32)
Calcium: 8.5 mg/dL — ABNORMAL LOW (ref 8.9–10.3)
Chloride: 103 mmol/L (ref 98–111)
Creatinine, Ser: 0.63 mg/dL (ref 0.44–1.00)
GFR calc Af Amer: >60 mL/min (ref >60)
GFR calc non Af Amer: >60 mL/min (ref >60)
Glucose, Bld: 106 mg/dL — ABNORMAL HIGH (ref 70–99)
Potassium: 2.7 mmol/L — CL (ref 3.5–5.1)
Sodium: 140 mmol/L (ref 135–145)

## 2018-06-04 LAB — MAGNESIUM: Magnesium: 1.8 mg/dL (ref 1.7–2.4)

## 2018-06-04 LAB — PHOSPHORUS: Phosphorus: 2.6 mg/dL (ref 2.5–4.6)

## 2018-06-04 MED ORDER — AMLODIPINE BESYLATE 10 MG PO TABS: 10.0000 mg | ORAL_TABLET | Freq: Every day | ORAL | 5 refills | Status: AC

## 2018-06-04 MED ORDER — METRONIDAZOLE 500 MG PO TABS: 500.0000 mg | ORAL_TABLET | Freq: Three times a day (TID) | ORAL | 0 refills | Status: AC

## 2018-06-04 MED ORDER — AMOXICILLIN-POT CLAVULANATE 875-125 MG PO TABS: 1.0000 | ORAL_TABLET | Freq: Two times a day (BID) | ORAL | 0 refills | Status: AC

## 2018-06-04 MED ORDER — BENAZEPRIL HCL 20 MG PO TABS: 20.0000 mg | ORAL_TABLET | Freq: Every day | ORAL | 5 refills | Status: AC

## 2018-06-04 MED ORDER — POTASSIUM CHLORIDE 20 MEQ/15ML (10%) PO SOLN
40.0000 meq | Freq: Once | ORAL | Status: AC
  Administered 2018-06-04: 08:00:00 40 meq via ORAL
  Filled 2018-06-04: qty 30

## 2018-06-04 MED ORDER — MAGNESIUM SULFATE 2 GM/50ML IV SOLN
2.0000 g | Freq: Once | INTRAVENOUS | Status: AC
  Administered 2018-06-04: 2 g via INTRAVENOUS
  Filled 2018-06-04: qty 50

## 2018-06-04 NOTE — Progress Notes (Signed)
Report called to nurse Butch Penny at [facility], she stated that someone from their facility would be here within the next hour to pick the patient up to transport them to their facility.

## 2018-06-04 NOTE — NC FL2 (Addendum)
[place] MEDICAID FL2 LEVEL OF CARE SCREENING TOOL     IDENTIFICATION  Patient Name: [name] Birthdate: 07-06-1920 Sex: female Admission Date (Current Location): 05/29/2018  [facility] and [place] Number:  [employer] and Address:  [place] [address], [place]      Provider Number: [id number]  Attending Physician Name and Address:  Sinda Du, MD  Relative Name and Phone Number:  Tally Joe Daughter [phone]  [phone]     Current Level of Care: Hospital Recommended Level of Care: [place] Prior Approval Number:    Date Approved/Denied:   PASRR Number:    Discharge Plan: Other (Comment)(ALF)    Current Diagnoses: Patient Active Problem List   Diagnosis Date Noted  . Colitis 05/29/2018  . Pressure injury of skin 05/29/2018  . Interstitial pulmonary disease ([place]) 03/29/2018  . Volume overload 03/29/2018  . Hypoxia 03/28/2018  . Dementia ([place]) 03/28/2018  . Goals of care, counseling/discussion   . Encounter for hospice care discussion   . Palliative care by specialist   . Terminal care   . Agitation   . Dyspnea   . HCAP (healthcare-associated pneumonia) 02/03/2018  . AKI (acute kidney injury) ([place]) 02/03/2018  . Dehydration 02/03/2018  . Acute respiratory failure with hypoxia ([place]) 02/03/2018  . Pneumonia 12/17/2017  . Altered mental status 07/10/2016  . Urinary incontinence 07/10/2016  . History of CVA (cerebrovascular accident) - by CT scan 07/10/2016  . Compression fracture of L2 lumbar vertebra (HCC) 11/25/2015  . Compression fracture of spine, non-traumatic ([place]) 11/25/2015  . Osteoporosis 11/25/2015  . Peripheral arterial disease ([place]) 11/25/2015  . Pyelonephritis, acute 11/23/2015  . UTI (urinary tract infection) 11/23/2015  . Community acquired pneumonia 07/11/2014  . Urinary tract infection, site not specified 03/01/2012  . Acute delirium 03/01/2012  . Essential  hypertension, benign 03/01/2012  . Hypokalemia 03/01/2012    Orientation RESPIRATION BLADDER Height & Weight     Self  O2 Incontinent Weight: 40.8 kg Height:  5\' 4"  (162.6 cm)  BEHAVIORAL SYMPTOMS/MOOD NEUROLOGICAL BOWEL NUTRITION STATUS  (n/a)   Continent Diet(Soft)  AMBULATORY STATUS COMMUNICATION OF NEEDS Skin   Limited Assist Verbally Other (Comment)(blister to right buttock; red but blanchable sacrum)                       Personal Care Assistance Level of Assistance  Bathing, Feeding, Dressing Bathing Assistance: Limited assistance Feeding assistance: Limited assistance Dressing Assistance: Limited assistance     Functional Limitations Info  Sight, Hearing, Speech Sight Info: Impaired Hearing Info: Impaired(HOH) Speech Info: Adequate    SPECIAL CARE FACTORS FREQUENCY                       Contractures Contractures Info: Not present    Additional Factors Info  Code Status, Psychotropic, Allergies Code Status Info: DNR Allergies Info: sulfa antibiotics Psychotropic Info: Ativan, Zoloft         Current Medications (06/04/2018):  This is the current hospital active medication list Current Facility-Administered Medications  Medication Dose Route Frequency Provider Last Rate Last Dose  . acetaminophen (TYLENOL) tablet 650 mg  650 mg Oral Q6H PRN Emokpae, Ejiroghene E, MD       Or  . acetaminophen (TYLENOL) suppository 650 mg  650 mg Rectal Q6H PRN Emokpae, Ejiroghene E, MD      . amLODipine (NORVASC) tablet 10 mg  10 mg Oral Daily Emokpae, Leanne Chang, MD  10 mg at 06/04/18 0927  . amoxicillin-clavulanate (AUGMENTIN) 875-125 MG per tablet 1 tablet  1 tablet Oral Q12H Sinda Du, MD   1 tablet at 06/04/18 [phone]  . benazepril (LOTENSIN) tablet 20 mg  20 mg Oral Daily Emokpae, Ejiroghene E, MD   20 mg at 06/04/18 0927  . conjugated estrogens (PREMARIN) vaginal cream 1 Applicatorful  1 Applicatorful Vaginal QHS Emokpae, Ejiroghene E, MD   1  Applicatorful at 10/93/23 2156  . enoxaparin (LOVENOX) injection 30 mg  30 mg Subcutaneous Q24H Emokpae, Ejiroghene E, MD   30 mg at 06/03/18 2156  . labetalol (NORMODYNE) injection 5 mg  5 mg Intravenous Q2H PRN Sinda Du, MD   5 mg at 06/02/18 [phone]  . metoprolol tartrate (LOPRESSOR) tablet 25 mg  25 mg Oral BID Emokpae, Ejiroghene E, MD   25 mg at 06/04/18 0927  . metroNIDAZOLE (FLAGYL) tablet 500 mg  500 mg Oral Q8H Sinda Du, MD   500 mg at 06/04/18 0303  . mirabegron ER (MYRBETRIQ) tablet 25 mg  25 mg Oral q morning - 10a Emokpae, Ejiroghene E, MD   25 mg at 06/04/18 0926  . ondansetron (ZOFRAN) tablet 4 mg  4 mg Oral Q6H PRN Emokpae, Ejiroghene E, MD       Or  . ondansetron (ZOFRAN) injection 4 mg  4 mg Intravenous Q6H PRN Emokpae, Ejiroghene E, MD   4 mg at 05/29/18 2116  . polyethylene glycol (MIRALAX / GLYCOLAX) packet 17 g  17 g Oral Daily PRN Emokpae, Ejiroghene E, MD      . sertraline (ZOLOFT) tablet 50 mg  50 mg Oral q morning - 10a Emokpae, Ejiroghene E, MD   50 mg at 06/04/18 0927     Discharge Medications: Medication List    STOP taking these medications   doxycycline 100 MG capsule Commonly known as:  MONODOX     TAKE these medications   acetaminophen 500 MG tablet Commonly known as:  TYLENOL Take 1,000 mg by mouth at bedtime.   amLODipine 10 MG tablet Commonly known as:  NORVASC Take 1 tablet (10 mg total) by mouth daily.   amoxicillin-clavulanate 875-125 MG tablet Commonly known as:  AUGMENTIN Take 1 tablet by mouth every 12 (twelve) hours for 3 days.   benazepril 20 MG tablet Commonly known as:  LOTENSIN Take 1 tablet (20 mg total) by mouth daily.   bisacodyl 10 MG suppository Commonly known as:  DULCOLAX Place 1 suppository (10 mg total) rectally daily as needed for moderate constipation.   conjugated estrogens vaginal cream Commonly known as:  PREMARIN Place 1 Applicatorful vaginally at bedtime.   LORazepam 0.5 MG  tablet Commonly known as:  ATIVAN Take 1 tablet (0.5 mg total) by mouth every 4 (four) hours as needed for anxiety or sedation.   metoprolol tartrate 25 MG tablet Commonly known as:  LOPRESSOR Take 1 tablet (25 mg total) by mouth 2 (two) times daily.   metroNIDAZOLE 500 MG tablet Commonly known as:  FLAGYL Take 1 tablet (500 mg total) by mouth every 8 (eight) hours for 3 days.   Myrbetriq 25 MG Tb24 tablet Generic drug:  mirabegron ER Take 25 mg by mouth every morning.   sertraline 50 MG tablet Commonly known as:  ZOLOFT Take 50 mg by mouth every morning.   traZODone 50 MG tablet Commonly known as:  DESYREL Take 50 mg by mouth at bedtime.   3Relevant Imaging Results:  Relevant Lab Results:   Additional Information SSN  [place]  Boneta Lucks, RN

## 2018-06-04 NOTE — Care Management Important Message (Signed)
Important Message  Patient Details  Name: [name] MRN: [id number] Date of Birth: January 19, 1920   Medicare Important Message Given:  Yes    Tommy Medal 06/04/2018, 12:09 PM

## 2018-06-04 NOTE — Progress Notes (Signed)
Subjective: She was admitted with colitis and seems to be essentially over that.  She is not complaining of any abdominal pain but she is confused.  She had more trouble swallowing yesterday and I have changed her to a pured diet  Objective: Vital signs in last 24 hours: Temp:  [98.1 F (36.7 C)-98.3 F (36.8 C)] 98.3 F (36.8 C) (05/18 0620) Pulse Rate:  [60-66] 66 (05/18 0620) Resp:  [14-18] 14 (05/18 0620) BP: (151-163)/(61-69) 154/69 (05/18 0620) SpO2:  [93 %-97 %] 97 % (05/18 0810) Weight change:  Last BM Date: 06/01/18  Intake/Output from previous day: 05/17 0701 - 05/18 0700 In: 360 [P.O.:360] Out: 600 [Urine:600]  PHYSICAL EXAM General appearance: alert and Confused Resp: clear to auscultation bilaterally Cardio: regular rate and rhythm, S1, S2 normal, no murmur, click, rub or gallop GI: soft, non-tender; bowel sounds normal; no masses,  no organomegaly Extremities: extremities normal, atraumatic, no cyanosis or edema  Lab Results:  Results for orders placed or performed during the hospital encounter of 05/29/18 (from the past 48 hour(s))  CBC with Differential/Platelet     Status: None   Collection Time: 06/04/18  5:22 AM  Result Value Ref Range   WBC 8.6 4.0 - 10.5 K/uL   RBC 4.65 3.87 - 5.11 MIL/uL   Hemoglobin 13.9 12.0 - 15.0 g/dL   HCT 42.6 36.0 - 46.0 %   MCV 91.6 80.0 - 100.0 fL   MCH 29.9 26.0 - 34.0 pg   MCHC 32.6 30.0 - 36.0 g/dL   RDW 13.1 11.5 - 15.5 %   Platelets 275 150 - 400 K/uL   nRBC 0.0 0.0 - 0.2 %   Neutrophils Relative % 70 %   Neutro Abs 6.0 1.7 - 7.7 K/uL   Lymphocytes Relative 17 %   Lymphs Abs 1.5 0.7 - 4.0 K/uL   Monocytes Relative 10 %   Monocytes Absolute 0.9 0.1 - 1.0 K/uL   Eosinophils Relative 2 %   Eosinophils Absolute 0.2 0.0 - 0.5 K/uL   Basophils Relative 1 %   Basophils Absolute 0.1 0.0 - 0.1 K/uL   Immature Granulocytes 0 %   Abs Immature Granulocytes 0.03 0.00 - 0.07 K/uL    Comment: Performed at [facility], [address] [place], [place] [address]  Basic metabolic panel     Status: Abnormal   Collection Time: 06/04/18  5:22 AM  Result Value Ref Range   Sodium 140 135 - 145 mmol/L   Potassium 2.7 (LL) 3.5 - 5.1 mmol/L    Comment: CRITICAL RESULT CALLED TO, READ BACK BY AND VERIFIED WITH: THOMAS,C AT 6:15AM ON 06/04/18 BY FESTERMAN,C    Chloride 103 98 - 111 mmol/L   CO2 27 22 - 32 mmol/L   Glucose, Bld 106 (H) 70 - 99 mg/dL   BUN 13 8 - 23 mg/dL   Creatinine, Ser 0.63 0.44 - 1.00 mg/dL   Calcium 8.5 (L) 8.9 - 10.3 mg/dL   GFR calc non Af Amer >60 >60 mL/min   GFR calc Af Amer >60 >60 mL/min   Anion gap 10 5 - 15    Comment: Performed at [facility], [address], [place], [place] [address]  Magnesium     Status: None   Collection Time: 06/04/18  5:22 AM  Result Value Ref Range   Magnesium 1.8 1.7 - 2.4 mg/dL    Comment: Performed at [facility], [address] [place], [place] [address]  Phosphorus     Status:  None   Collection Time: 06/04/18  5:22 AM  Result Value Ref Range   Phosphorus 2.6 2.5 - 4.6 mg/dL    Comment: Performed at [facility], [address] [place], [place] [address]    ABGS No results for input(s): PHART, PO2ART, TCO2, HCO3 in the last 72 hours.  Invalid input(s): PCO2 CULTURES Recent Results (from the past 240 hour(s))  SARS Coronavirus 2 (CEPHEID - Performed in [place] hospital lab), Hosp Order     Status: None   Collection Time: 05/29/18  8:32 AM  Result Value Ref Range Status   SARS Coronavirus 2 NEGATIVE NEGATIVE Final    Comment: (NOTE) If result is NEGATIVE SARS-CoV-2 target nucleic acids are NOT DETECTED. The SARS-CoV-2 RNA is generally detectable in upper and lower  respiratory specimens during the acute phase of infection. The lowest  concentration of SARS-CoV-2 viral copies this assay can detect is 250  copies / mL. A negative result does not preclude SARS-CoV-2 infection  and should not be used as the sole basis for treatment or  other  patient management decisions.  A negative result may occur with  improper specimen collection / handling, submission of specimen other  than nasopharyngeal swab, presence of viral mutation(s) within the  areas targeted by this assay, and inadequate number of viral copies  (<250 copies / mL). A negative result must be combined with clinical  observations, patient history, and epidemiological information. If result is POSITIVE SARS-CoV-2 target nucleic acids are DETECTED. The SARS-CoV-2 RNA is generally detectable in upper and lower  respiratory specimens dur ing the acute phase of infection.  Positive  results are indicative of active infection with SARS-CoV-2.  Clinical  correlation with patient history and other diagnostic information is  necessary to determine patient infection status.  Positive results do  not rule out bacterial infection or co-infection with other viruses. If result is PRESUMPTIVE POSTIVE SARS-CoV-2 nucleic acids MAY BE PRESENT.   A presumptive positive result was obtained on the submitted specimen  and confirmed on repeat testing.  While 2019 novel coronavirus  (SARS-CoV-2) nucleic acids may be present in the submitted sample  additional confirmatory testing may be necessary for epidemiological  and / or clinical management purposes  to differentiate between  SARS-CoV-2 and other Sarbecovirus currently known to infect humans.  If clinically indicated additional testing with an alternate test  methodology [id number]) is advised. The SARS-CoV-2 RNA is generally  detectable in upper and lower respiratory sp ecimens during the acute  phase of infection. The expected result is Negative. Fact Sheet for Patients:  StrictlyIdeas.no Fact Sheet for Healthcare Providers: BankingDealers.co.za This test is not yet approved or cleared by the [place] FDA and has been authorized for detection and/or diagnosis of  SARS-CoV-2 by FDA under an Emergency Use Authorization (EUA).  This EUA will remain in effect (meaning this test can be used) for the duration of the COVID-19 declaration under Section 564(b)(1) of the Act, 21 U.S.C. section 360bbb-3(b)(1), unless the authorization is terminated or revoked sooner. Performed at [facility], [address] [place], [place] [address]   MRSA PCR Screening     Status: None   Collection Time: 05/29/18  9:20 PM  Result Value Ref Range Status   MRSA by PCR NEGATIVE NEGATIVE Final    Comment:        The GeneXpert MRSA Assay (FDA approved for NASAL specimens only), is one component of a comprehensive MRSA colonization surveillance program. It is not intended to  diagnose MRSA infection nor to guide or monitor treatment for MRSA infections. Performed at [facility], [address], [place], [place] [address]    Studies/Results: No results found.  Medications:  Prior to Admission:  Medications Prior to Admission  Medication Sig Dispense Refill Last Dose  . acetaminophen (TYLENOL) 500 MG tablet Take 1,000 mg by mouth at bedtime.    05/28/2018 at Unknown time  . bisacodyl (DULCOLAX) 10 MG suppository Place 1 suppository (10 mg total) rectally daily as needed for moderate constipation. 12 suppository 0 unknown  . conjugated estrogens (PREMARIN) vaginal cream Place 1 Applicatorful vaginally at bedtime.   05/28/2018 at Unknown time  . doxycycline (MONODOX) 100 MG capsule Take 100 mg by mouth 2 (two) times daily.   05/29/2018 at Unknown time  . LORazepam (ATIVAN) 0.5 MG tablet Take 1 tablet (0.5 mg total) by mouth every 4 (four) hours as needed for anxiety or sedation. 30 tablet 0 unknown  . metoprolol tartrate (LOPRESSOR) 25 MG tablet Take 1 tablet (25 mg total) by mouth 2 (two) times daily. 60 tablet 5 05/29/2018 at 0800  . mirabegron ER (MYRBETRIQ) 25 MG TB24 tablet Take 25 mg by mouth every morning.    05/29/2018 at Unknown time  . sertraline (ZOLOFT) 50 MG  tablet Take 50 mg by mouth every morning.    05/29/2018 at Unknown time  . traZODone (DESYREL) 50 MG tablet Take 50 mg by mouth at bedtime.   05/28/2018 at Unknown time   Scheduled: . amLODipine  10 mg Oral Daily  . amoxicillin-clavulanate  1 tablet Oral Q12H  . benazepril  20 mg Oral Daily  . conjugated estrogens  1 Applicatorful Vaginal QHS  . enoxaparin (LOVENOX) injection  30 mg Subcutaneous Q24H  . metoprolol tartrate  25 mg Oral BID  . metroNIDAZOLE  500 mg Oral Q8H  . mirabegron ER  25 mg Oral q morning - 10a  . sertraline  50 mg Oral q morning - 10a   Continuous: . magnesium sulfate bolus IVPB 2 g (06/04/18 0740)   [id number] **OR** acetaminophen, labetalol, ondansetron **OR** ondansetron (ZOFRAN) IV, polyethylene glycol  Assesment: She was admitted with colitis.  She is been treated with antibiotics and has improved.  She has hypertension at baseline and her blood pressures been up and down.  She has dementia which is pretty severe and she has had swallowing trouble likely related to that.  She is on pured diet now and I will see if that can be provided at her assisted living facility.  She has unstageable pressure injury of the skin to her sacrum Active Problems:   Colitis   Pressure injury of skin    Plan: Potential discharge back to assisted living facility today she will have home health services    LOS: 5 days   Alonza Bogus 06/04/2018, 8:18 AM

## 2018-06-04 NOTE — Plan of Care (Signed)
  Problem: Education: Goal: Knowledge of General Education information will improve Description Including pain rating scale, medication(s)/side effects and non-pharmacologic comfort measures Outcome: Adequate for Discharge   Problem: Health Behavior/Discharge Planning: Goal: Ability to manage health-related needs will improve Outcome: Adequate for Discharge   Problem: Clinical Measurements: Goal: Ability to maintain clinical measurements within normal limits will improve Outcome: Adequate for Discharge   Problem: Activity: Goal: Risk for activity intolerance will decrease Outcome: Adequate for Discharge   Problem: Nutrition: Goal: Adequate nutrition will be maintained Outcome: Adequate for Discharge   Problem: Coping: Goal: Level of anxiety will decrease Outcome: Adequate for Discharge   Problem: Elimination: Goal: Will not experience complications related to bowel motility Outcome: Adequate for Discharge   Problem: Pain Managment: Goal: General experience of comfort will improve Outcome: Adequate for Discharge   Problem: Safety: Goal: Ability to remain free from injury will improve Outcome: Adequate for Discharge   Problem: Skin Integrity: Goal: Risk for impaired skin integrity will decrease Outcome: Adequate for Discharge   

## 2018-06-04 NOTE — Discharge Summary (Signed)
Physician Discharge Summary  Patient ID: TEMPEST FRANKLAND MRN: [id number] DOB/AGE: [birth year] 83 y.o. Primary Care Physician:Thayne Cindric, Percell Miller, MD Admit date: 05/29/2018 Discharge date: 06/04/2018    Discharge Diagnoses:   Active Problems:   Colitis   Pressure injury of skin Hypertension Dementia Dysphagia Anxiety Depression  Allergies as of 06/04/2018      Reactions   Sulfa Antibiotics    Not listed on MAR provided by Nursing facility      Medication List    STOP taking these medications   doxycycline 100 MG capsule Commonly known as:  MONODOX     TAKE these medications   acetaminophen 500 MG tablet Commonly known as:  TYLENOL Take 1,000 mg by mouth at bedtime.   amLODipine 10 MG tablet Commonly known as:  NORVASC Take 1 tablet (10 mg total) by mouth daily.   amoxicillin-clavulanate 875-125 MG tablet Commonly known as:  AUGMENTIN Take 1 tablet by mouth every 12 (twelve) hours for 3 days.   benazepril 20 MG tablet Commonly known as:  LOTENSIN Take 1 tablet (20 mg total) by mouth daily.   bisacodyl 10 MG suppository Commonly known as:  DULCOLAX Place 1 suppository (10 mg total) rectally daily as needed for moderate constipation.   conjugated estrogens vaginal cream Commonly known as:  PREMARIN Place 1 Applicatorful vaginally at bedtime.   LORazepam 0.5 MG tablet Commonly known as:  ATIVAN Take 1 tablet (0.5 mg total) by mouth every 4 (four) hours as needed for anxiety or sedation.   metoprolol tartrate 25 MG tablet Commonly known as:  LOPRESSOR Take 1 tablet (25 mg total) by mouth 2 (two) times daily.   metroNIDAZOLE 500 MG tablet Commonly known as:  FLAGYL Take 1 tablet (500 mg total) by mouth every 8 (eight) hours for 3 days.   Myrbetriq 25 MG Tb24 tablet Generic drug:  mirabegron ER Take 25 mg by mouth every morning.   sertraline 50 MG tablet Commonly known as:  ZOLOFT Take 50 mg by mouth every morning.   traZODone 50 MG tablet Commonly known  as:  DESYREL Take 50 mg by mouth at bedtime.       Discharged Condition: Improved    Consults: None  Significant Diagnostic Studies: Ct Head Wo Contrast  Result Date: 05/29/2018 CLINICAL DATA:  Altered level of consciousness, unexplained. History of acute delirium. History of dementia, and hypertension. EXAM: CT HEAD WITHOUT CONTRAST TECHNIQUE: Contiguous axial images were obtained from the base of the skull through the vertex without intravenous contrast. COMPARISON:  03/28/2018 FINDINGS: Significant motion degradation despite best attempts of the technologist. The study is of marginal diagnostic utility. Brain: Advanced atrophy with multilobar infarcts including LEFT frontal, LEFT posterior limb internal capsule and white matter, RIGHT basal ganglia, and RIGHT occipital lobes, but no definite acute infarct, hemorrhage, mass lesion, hydrocephalus, or extra-axial fluid. Advanced chronic microvascular ischemic change is superimposed. Vascular: Calcification of the cavernous internal carotid arteries consistent with cerebrovascular atherosclerotic disease. No signs of intracranial large vessel occlusion. Skull: Calvarium grossly intact. Sinuses/Orbits: No acute finding. Other: None IMPRESSION: Significantly motion degraded scan demonstrates no definite acute finding. One can be fairly confident that no hemorrhage is present. Compared with priors, age advanced atrophy, chronic multilobar infarcts, and small vessel disease appear grossly stable. Electronically Signed   By: Staci Righter M.D.   On: 05/29/2018 09:30   Ct Abdomen Pelvis W Contrast  Result Date: 05/29/2018 CLINICAL DATA:  86 year old female with nausea vomiting and abdominal pain onset today. Hypertensive. EXAM: CT  ABDOMEN AND PELVIS WITH CONTRAST TECHNIQUE: Multidetector CT imaging of the abdomen and pelvis was performed using the standard protocol following bolus administration of intravenous contrast. CONTRAST:  54mL OMNIPAQUE IOHEXOL  300 MG/ML  SOLN COMPARISON:  Noncontrast CT Abdomen and Pelvis 11/23/2015. FINDINGS: Lower chest: Trace bilateral pleural effusions. Lower lung volumes with possible mild septal thickening at the lung bases. Cardiomegaly has increased since 2017. No pericardial effusion. Calcified coronary artery and Calcified aortic atherosclerosis. Hepatobiliary: Surgically absent gallbladder as before. Stable and negative liver. Pancreas: Atrophied, otherwise negative. Spleen: Negative. Adrenals/Urinary Tract: Normal adrenal glands. Bulky chronic 15 millimeter left renal calculus is located in the lower pole today with no hydronephrosis. Bilateral renal enhancement and contrast excretion is symmetric and within normal limits. There are occasional small benign appearing renal cysts. No hydroureter. Streak artifact in the pelvis due to hip arthroplasty, no abnormality of the urinary bladder identified. Stomach/Bowel: 7-8 centimeter stool ball in the rectum. No definite rectal inflammation. Severe chronic diverticulosis of the sigmoid colon, no active inflammation identified. Mild diverticulosis of the descending colon, where there is circumferential colonic wall thickening and hyper enhancement on series 2, image 42. This continues into the transverse colon which is largely decompressed and difficult to delineate from small bowel. The ascending colon also demonstrates some wall thickening although the cecum appears relatively spared. Negative terminal ileum. No dilated small bowel. Gastric ptosis, but otherwise negative stomach. No free air or free fluid identified. Vascular/Lymphatic: Extensive Aortoiliac calcified atherosclerosis. The major arterial structures appear patent. Portal venous system appears to be patent. No lymphadenopathy. Reproductive: Stable calcified uterine fibroids. Other: No pelvic free fluid. Musculoskeletal: Osteopenia. Chronic L2 and L5 compression fractures, the latter has mildly progressed since 2017.  Previous mid sacral fracture suspected and stable. Chronic right hip arthroplasty. No acute osseous abnormality identified. IMPRESSION: 1. Circumferential wall thickening from the ascending to the descending colon suspicious for mild acute colitis. No obstruction, free fluid, or complicating features. 2. Severe diverticulosis of the sigmoid colon but no active inflammation identified. Stool ball in the rectum suggesting fecal impaction. 3. Trace layering pleural effusions, questionable pulmonary interstitial edema. Progressed cardiomegaly since 2017. No pericardial effusion. 4.  Aortic Atherosclerosis (ICD10-I70.0). Electronically Signed   By: Genevie Ann M.D.   On: 05/29/2018 11:41   Dg Abd Acute W/chest  Result Date: 05/29/2018 CLINICAL DATA:  Nausea and vomiting. Combative patient. EXAM: DG ABDOMEN ACUTE W/ 1V CHEST COMPARISON:  03/28/2018. FINDINGS: The heart is enlarged. There is mild vascular congestion. Skeletal osteopenia is noted. There is calcified thoracic and abdominal aorta. There is a moderate stool burden but no obstruction or free air. Cholecystectomy clips. Calcified fibroids. Previous RIGHT hip replacement. Chronic radiodense LEFT mid abdominal calcification, stable. IMPRESSION: 1. Cardiomegaly with mild vascular congestion. 2. Moderate stool burden without obstruction or free air. Electronically Signed   By: Staci Righter M.D.   On: 05/29/2018 09:40    Lab Results: Basic Metabolic Panel: Recent Labs    06/04/18 0522  NA 140  K 2.7*  CL 103  CO2 27  GLUCOSE 106*  BUN 13  CREATININE 0.63  CALCIUM 8.5*  MG 1.8  PHOS 2.6   Liver Function Tests: No results for input(s): AST, ALT, ALKPHOS, BILITOT, PROT, ALBUMIN in the last 72 hours.   CBC: Recent Labs    06/04/18 0522  WBC 8.6  NEUTROABS 6.0  HGB 13.9  HCT 42.6  MCV 91.6  PLT 275    Recent Results (from the past 240 hour(s))  SARS Coronavirus 2 (CEPHEID - Performed in [place] hospital lab), Hosp Order     Status:  None   Collection Time: 05/29/18  8:32 AM  Result Value Ref Range Status   SARS Coronavirus 2 NEGATIVE NEGATIVE Final    Comment: (NOTE) If result is NEGATIVE SARS-CoV-2 target nucleic acids are NOT DETECTED. The SARS-CoV-2 RNA is generally detectable in upper and lower  respiratory specimens during the acute phase of infection. The lowest  concentration of SARS-CoV-2 viral copies this assay can detect is 250  copies / mL. A negative result does not preclude SARS-CoV-2 infection  and should not be used as the sole basis for treatment or other  patient management decisions.  A negative result may occur with  improper specimen collection / handling, submission of specimen other  than nasopharyngeal swab, presence of viral mutation(s) within the  areas targeted by this assay, and inadequate number of viral copies  (<250 copies / mL). A negative result must be combined with clinical  observations, patient history, and epidemiological information. If result is POSITIVE SARS-CoV-2 target nucleic acids are DETECTED. The SARS-CoV-2 RNA is generally detectable in upper and lower  respiratory specimens dur ing the acute phase of infection.  Positive  results are indicative of active infection with SARS-CoV-2.  Clinical  correlation with patient history and other diagnostic information is  necessary to determine patient infection status.  Positive results do  not rule out bacterial infection or co-infection with other viruses. If result is PRESUMPTIVE POSTIVE SARS-CoV-2 nucleic acids MAY BE PRESENT.   A presumptive positive result was obtained on the submitted specimen  and confirmed on repeat testing.  While 2019 novel coronavirus  (SARS-CoV-2) nucleic acids may be present in the submitted sample  additional confirmatory testing may be necessary for epidemiological  and / or clinical management purposes  to differentiate between  SARS-CoV-2 and other Sarbecovirus currently known to infect  humans.  If clinically indicated additional testing with an alternate test  methodology [id number]) is advised. The SARS-CoV-2 RNA is generally  detectable in upper and lower respiratory sp ecimens during the acute  phase of infection. The expected result is Negative. Fact Sheet for Patients:  StrictlyIdeas.no Fact Sheet for Healthcare Providers: BankingDealers.co.za This test is not yet approved or cleared by the [place] FDA and has been authorized for detection and/or diagnosis of SARS-CoV-2 by FDA under an Emergency Use Authorization (EUA).  This EUA will remain in effect (meaning this test can be used) for the duration of the COVID-19 declaration under Section 564(b)(1) of the Act, 21 U.S.C. section 360bbb-3(b)(1), unless the authorization is terminated or revoked sooner. Performed at [facility], [address], [place], [place] [address]   MRSA PCR Screening     Status: None   Collection Time: 05/29/18  9:20 PM  Result Value Ref Range Status   MRSA by PCR NEGATIVE NEGATIVE Final    Comment:        The GeneXpert MRSA Assay (FDA approved for NASAL specimens only), is one component of a comprehensive MRSA colonization surveillance program. It is not intended to diagnose MRSA infection nor to guide or monitor treatment for MRSA infections. Performed at [facility], [address], [place], [place] [address]      Hospital Course: This is a 85 year old who came to the emergency department because of vomiting.  She was given Zofran which did not help and then she came to the emergency department.  She was found to be hypertensive  and had no changes on CT that appeared to be different from baseline.  She had probable colitis on CT abdomen and pelvis.  She was started on antibiotics and IV fluids.  She remained confused.  She eventually improved was back almost to her baseline mental status.  She had trouble swallowing which is been  an long-term issue.  Her blood pressure was better controlled by the time of discharge.  Discharge Exam: Blood pressure (!) 154/69, pulse 66, temperature 98.3 F (36.8 C), temperature source Oral, resp. rate 14, height 5\' 4"  (1.626 m), weight 40.8 kg, SpO2 97 %. She is awake and alert and confused.  Her abdomen is soft.  Chest is clear.  Heart is regular.  Disposition: Back to her assisted living facility.  She will have 3 days of Augmentin and Flagyl.  Home health services.  Continue other treatments.  Pured diet if available if not she will be on a soft diet with help with meals and aspiration precautions      Signed: Alonza Bogus   06/04/2018, 8:25 AM

## 2018-06-04 NOTE — TOC Transition Note (Addendum)
Transition of Care [facility]) - CM/SW Discharge Note   Patient Details  Name: [name] MRN: [id number] Date of Birth: 06/13/1920  Transition of Care [facility]) CM/SW Contact:  Boneta Lucks, RN Phone Number: 06/04/2018, 11:40 AM   Clinical Narrative:    Admitted with colitis. Called Butch Penny of [place], she confirms [facility] delivered [facility] to the facility. Patient is on room air at present and Per RN she has maintained and will be fine for transport. MD ordered Home Health RN, [facility]ch [facility] ask for [facility], but they would not accept patient insurance. [facility]ch Penny 2nd choice was [facility].  Called Vaughan Basta of [facility] to give the referral and make aware of  Discharge this afternoon.  Pt is high risk of readmission, reviewed chart and follow up appointment made with Dr Luan Pulling.  Called HEr daughter [name] to make her aware patient is going back to [place] this afternoon.    Final next level of care: Assisted Living Barriers to Discharge: Barriers Resolved(O2 dielivered )   Patient Goals and CMS Choice Patient states their goals for this hospitalization and ongoing recovery are:: To return back to [facility].gov Compare Post Acute Care list provided to:: Patient Represenative (must comment)([facility]) Choice offered to / list presented to : NA  Discharge Placement                    Patient and family notified of of transfer: 06/04/18  Discharge Plan and Services   Discharge Planning Services: CM Consult                      HH Arranged: PT [facility] Agency: [place] ([place]) Date [place]: 06/04/18 Time [place]: [place] Representative spoke with at [place]: [place] ([place]) Interventions     Readmission Risk Interventions Readmission Risk Prevention Plan 05/31/2018  Transportation Screening Complete  Palliative Care Screening Not Applicable  Medication Review (RN Care Manager) Complete   Some recent data might be hidden

## 2018-06-05 DIAGNOSIS — F411 Generalized anxiety disorder: Secondary | ICD-10-CM | POA: Diagnosis not present

## 2018-06-05 DIAGNOSIS — I69391 Dysphagia following cerebral infarction: Secondary | ICD-10-CM | POA: Diagnosis not present

## 2018-06-05 DIAGNOSIS — F0390 Unspecified dementia without behavioral disturbance: Secondary | ICD-10-CM | POA: Diagnosis not present

## 2018-06-05 DIAGNOSIS — Z9981 Dependence on supplemental oxygen: Secondary | ICD-10-CM | POA: Diagnosis not present

## 2018-06-05 DIAGNOSIS — I10 Essential (primary) hypertension: Secondary | ICD-10-CM | POA: Diagnosis not present

## 2018-06-05 DIAGNOSIS — J8410 Pulmonary fibrosis, unspecified: Secondary | ICD-10-CM | POA: Diagnosis not present

## 2018-06-05 DIAGNOSIS — M810 Age-related osteoporosis without current pathological fracture: Secondary | ICD-10-CM | POA: Diagnosis not present

## 2018-06-05 DIAGNOSIS — K529 Noninfective gastroenteritis and colitis, unspecified: Secondary | ICD-10-CM | POA: Diagnosis not present

## 2018-06-14 DIAGNOSIS — F0390 Unspecified dementia without behavioral disturbance: Secondary | ICD-10-CM | POA: Diagnosis not present

## 2018-06-14 DIAGNOSIS — A09 Infectious gastroenteritis and colitis, unspecified: Secondary | ICD-10-CM | POA: Diagnosis not present

## 2018-06-14 DIAGNOSIS — I10 Essential (primary) hypertension: Secondary | ICD-10-CM | POA: Diagnosis not present

## 2018-06-29 DIAGNOSIS — J449 Chronic obstructive pulmonary disease, unspecified: Secondary | ICD-10-CM | POA: Diagnosis not present

## 2018-07-29 DIAGNOSIS — J449 Chronic obstructive pulmonary disease, unspecified: Secondary | ICD-10-CM | POA: Diagnosis not present

## 2018-08-03 DIAGNOSIS — H353131 Nonexudative age-related macular degeneration, bilateral, early dry stage: Secondary | ICD-10-CM | POA: Diagnosis not present

## 2018-08-15 ENCOUNTER — Other Ambulatory Visit: Payer: Self-pay

## 2018-08-29 DIAGNOSIS — J449 Chronic obstructive pulmonary disease, unspecified: Secondary | ICD-10-CM | POA: Diagnosis not present

## 2018-09-11 ENCOUNTER — Emergency Department (HOSPITAL_COMMUNITY): Payer: PPO

## 2018-09-11 ENCOUNTER — Other Ambulatory Visit: Payer: Self-pay

## 2018-09-11 ENCOUNTER — Inpatient Hospital Stay (HOSPITAL_COMMUNITY)
Admission: EM | Admit: 2018-09-11 | Discharge: 2018-09-21 | DRG: 536 | Disposition: A | Discharge status: Skilled Nursing Facility | Payer: PPO | Attending: Pulmonary Disease | Admitting: Pulmonary Disease

## 2018-09-11 ENCOUNTER — Encounter (HOSPITAL_COMMUNITY): Payer: Self-pay | Admitting: Emergency Medicine

## 2018-09-11 DIAGNOSIS — Z881 Allergy status to other antibiotic agents status: Secondary | ICD-10-CM

## 2018-09-11 DIAGNOSIS — W19XXXA Unspecified fall, initial encounter: Secondary | ICD-10-CM | POA: Diagnosis present

## 2018-09-11 DIAGNOSIS — Z20828 Contact with and (suspected) exposure to other viral communicable diseases: Secondary | ICD-10-CM | POA: Diagnosis present

## 2018-09-11 DIAGNOSIS — R410 Disorientation, unspecified: Secondary | ICD-10-CM | POA: Diagnosis not present

## 2018-09-11 DIAGNOSIS — Z79899 Other long term (current) drug therapy: Secondary | ICD-10-CM | POA: Diagnosis not present

## 2018-09-11 DIAGNOSIS — J849 Interstitial pulmonary disease, unspecified: Secondary | ICD-10-CM | POA: Diagnosis not present

## 2018-09-11 DIAGNOSIS — R451 Restlessness and agitation: Secondary | ICD-10-CM | POA: Diagnosis not present

## 2018-09-11 DIAGNOSIS — Z96641 Presence of right artificial hip joint: Secondary | ICD-10-CM | POA: Diagnosis not present

## 2018-09-11 DIAGNOSIS — F419 Anxiety disorder, unspecified: Secondary | ICD-10-CM | POA: Diagnosis not present

## 2018-09-11 DIAGNOSIS — Z03818 Encounter for observation for suspected exposure to other biological agents ruled out: Secondary | ICD-10-CM | POA: Diagnosis not present

## 2018-09-11 DIAGNOSIS — S32501A Unspecified fracture of right pubis, initial encounter for closed fracture: Secondary | ICD-10-CM | POA: Diagnosis not present

## 2018-09-11 DIAGNOSIS — Z79890 Hormone replacement therapy (postmenopausal): Secondary | ICD-10-CM | POA: Diagnosis not present

## 2018-09-11 DIAGNOSIS — I739 Peripheral vascular disease, unspecified: Secondary | ICD-10-CM | POA: Diagnosis present

## 2018-09-11 DIAGNOSIS — H547 Unspecified visual loss: Secondary | ICD-10-CM | POA: Diagnosis present

## 2018-09-11 DIAGNOSIS — N3281 Overactive bladder: Secondary | ICD-10-CM | POA: Diagnosis present

## 2018-09-11 DIAGNOSIS — Z79891 Long term (current) use of opiate analgesic: Secondary | ICD-10-CM | POA: Diagnosis not present

## 2018-09-11 DIAGNOSIS — Z66 Do not resuscitate: Secondary | ICD-10-CM | POA: Diagnosis present

## 2018-09-11 DIAGNOSIS — I499 Cardiac arrhythmia, unspecified: Secondary | ICD-10-CM | POA: Diagnosis not present

## 2018-09-11 DIAGNOSIS — F0390 Unspecified dementia without behavioral disturbance: Secondary | ICD-10-CM | POA: Diagnosis present

## 2018-09-11 DIAGNOSIS — H9190 Unspecified hearing loss, unspecified ear: Secondary | ICD-10-CM | POA: Diagnosis not present

## 2018-09-11 DIAGNOSIS — S32591A Other specified fracture of right pubis, initial encounter for closed fracture: Secondary | ICD-10-CM | POA: Diagnosis not present

## 2018-09-11 DIAGNOSIS — I959 Hypotension, unspecified: Secondary | ICD-10-CM | POA: Diagnosis not present

## 2018-09-11 DIAGNOSIS — Z8673 Personal history of transient ischemic attack (TIA), and cerebral infarction without residual deficits: Secondary | ICD-10-CM | POA: Diagnosis not present

## 2018-09-11 DIAGNOSIS — M810 Age-related osteoporosis without current pathological fracture: Secondary | ICD-10-CM | POA: Diagnosis not present

## 2018-09-11 DIAGNOSIS — M84454A Pathological fracture, pelvis, initial encounter for fracture: Secondary | ICD-10-CM | POA: Diagnosis not present

## 2018-09-11 DIAGNOSIS — S329XXA Fracture of unspecified parts of lumbosacral spine and pelvis, initial encounter for closed fracture: Secondary | ICD-10-CM | POA: Diagnosis present

## 2018-09-11 DIAGNOSIS — I10 Essential (primary) hypertension: Secondary | ICD-10-CM | POA: Diagnosis not present

## 2018-09-11 DIAGNOSIS — F329 Major depressive disorder, single episode, unspecified: Secondary | ICD-10-CM | POA: Diagnosis present

## 2018-09-11 DIAGNOSIS — S79929A Unspecified injury of unspecified thigh, initial encounter: Secondary | ICD-10-CM | POA: Diagnosis not present

## 2018-09-11 DIAGNOSIS — R4182 Altered mental status, unspecified: Secondary | ICD-10-CM | POA: Diagnosis present

## 2018-09-11 DIAGNOSIS — R220 Localized swelling, mass and lump, head: Secondary | ICD-10-CM | POA: Diagnosis not present

## 2018-09-11 DIAGNOSIS — R0689 Other abnormalities of breathing: Secondary | ICD-10-CM | POA: Diagnosis not present

## 2018-09-11 DIAGNOSIS — S32436A Nondisplaced fracture of anterior column [iliopubic] of unspecified acetabulum, initial encounter for closed fracture: Secondary | ICD-10-CM | POA: Diagnosis not present

## 2018-09-11 DIAGNOSIS — S0990XA Unspecified injury of head, initial encounter: Secondary | ICD-10-CM | POA: Diagnosis not present

## 2018-09-11 MED ORDER — ACETAMINOPHEN 325 MG PO TABS
650.0000 mg | ORAL_TABLET | Freq: Once | ORAL | Status: AC
  Administered 2018-09-11: 23:00:00 650 mg via ORAL
  Filled 2018-09-11: qty 2

## 2018-09-11 NOTE — ED Triage Notes (Signed)
Pt had unwitnessed fall at nursing home and c/o right hip pain.

## 2018-09-11 NOTE — ED Provider Notes (Signed)
[facility] EMERGENCY DEPARTMENT Provider Note   CSN: [id number] Arrival date & time: 09/11/18  [id number]     History   Chief Complaint Chief Complaint  Patient presents with   Fall    HPI [name] is a 85 y.o. female presenting from a local nursing home facility and had an unwitnessed fall earlier today.  Since the event she has had increasing complaints of pain in her right hip and thigh but apparently has been ambulatory at the facility.  She does endorse hitting her head, it is unclear if she may have had LOC.  Patient has dementia and is a poor history giver.  Level 5 caveat given dementia.     The history is provided by the patient.    Past Medical History:  Diagnosis Date   Anxiety    Dementia ([place])    Hypertension    Overactive bladder    Pneumonia    Stroke [facility])     Patient Active Problem List   Diagnosis Date Noted   Colitis 05/29/2018   Pressure injury of skin 05/29/2018   Interstitial pulmonary disease ([place]) 03/29/2018   Volume overload 03/29/2018   Hypoxia 03/28/2018   Dementia ([place]) 03/28/2018   Goals of care, counseling/discussion    Encounter for hospice care discussion    Palliative care by specialist    Terminal care    Agitation    Dyspnea    HCAP (healthcare-associated pneumonia) 02/03/2018   AKI (acute kidney injury) ([place]) 02/03/2018   Dehydration 02/03/2018   Acute respiratory failure with hypoxia ([place]) 02/03/2018   Pneumonia 12/17/2017   Altered mental status 07/10/2016   Urinary incontinence 07/10/2016   History of CVA (cerebrovascular accident) - by CT scan 07/10/2016   Compression fracture of L2 lumbar vertebra ([place]) 11/25/2015   Compression fracture of spine, non-traumatic ([place]) 11/25/2015   Osteoporosis 11/25/2015   Peripheral arterial disease ([place]) 11/25/2015   Pyelonephritis, acute 11/23/2015   UTI (urinary tract infection) 11/23/2015   Community acquired pneumonia 07/11/2014   Urinary  tract infection, site not specified 03/01/2012   Acute delirium 03/01/2012   Essential hypertension, benign 03/01/2012   Hypokalemia 03/01/2012    Past Surgical History:  Procedure Laterality Date   HIP ADDUCTOR TENOTOMY     JOINT REPLACEMENT       OB History   No obstetric history on file.      Home Medications    Prior to Admission medications   Medication Sig Start Date End Date Taking? Authorizing Provider  acetaminophen (TYLENOL) 500 MG tablet Take 1,000 mg by mouth at bedtime.    Yes [provider]  amLODipine (NORVASC) 10 MG tablet Take 1 tablet (10 mg total) by mouth daily. 06/04/18  Yes Sinda Du, MD  benazepril (LOTENSIN) 20 MG tablet Take 1 tablet (20 mg total) by mouth daily. 06/04/18  Yes Sinda Du, MD  bisacodyl (DULCOLAX) 10 MG suppository Place 1 suppository (10 mg total) rectally daily as needed for moderate constipation. 02/12/18  Yes Sinda Du, MD  conjugated estrogens (PREMARIN) vaginal cream Place 1 Applicatorful vaginally at bedtime.   Yes [provider]  LORazepam (ATIVAN) 0.5 MG tablet Take 1 tablet (0.5 mg total) by mouth every 4 (four) hours as needed for anxiety or sedation. 02/12/18  Yes Sinda Du, MD  metoprolol tartrate (LOPRESSOR) 25 MG tablet Take 1 tablet (25 mg total) by mouth 2 (two) times daily. 02/12/18  Yes Sinda Du, MD  mirabegron ER (MYRBETRIQ) 25 MG TB24 tablet  Take 25 mg by mouth every morning.    Yes [provider]  sertraline (ZOLOFT) 50 MG tablet Take 50 mg by mouth every morning.  06/24/16  Yes [provider]  traZODone (DESYREL) 50 MG tablet Take 50 mg by mouth at bedtime.   Yes [provider]    Family History No family history on file.  Social History Social History   Tobacco Use   Smoking status: Never Smoker   Smokeless tobacco: Never Used  Substance Use Topics   Alcohol use: No   Drug use: No     Allergies   Sulfa  antibiotics   Review of Systems Review of Systems  Unable to perform ROS: Dementia  Musculoskeletal: Positive for arthralgias.     Physical Exam Updated Vital Signs BP (!) 150/62    Pulse 81    Temp 98.6 F (37 C)    Resp 18    SpO2 100%   Physical Exam Vitals signs and nursing note reviewed.  Constitutional:      Appearance: She is well-developed.  HENT:     Head: Normocephalic and atraumatic.     Comments: No appreciable scalp hematoma or trauma.    Right Ear: Tympanic membrane normal.     Left Ear: Tympanic membrane normal.  Eyes:     Conjunctiva/sclera: Conjunctivae normal.  Neck:     Musculoskeletal: Normal range of motion. No muscular tenderness.  Cardiovascular:     Rate and Rhythm: Normal rate and regular rhythm.     Pulses:          Dorsalis pedis pulses are 2+ on the right side and 2+ on the left side.     Heart sounds: Normal heart sounds.  Pulmonary:     Effort: Pulmonary effort is normal.     Breath sounds: Normal breath sounds. No wheezing.  Chest:     Chest wall: No tenderness.  Abdominal:     General: Bowel sounds are normal.     Palpations: Abdomen is soft.     Tenderness: There is no abdominal tenderness. There is no guarding.  Musculoskeletal: Normal range of motion.        General: Tenderness present.     Right lower leg: No edema.     Left lower leg: No edema.     Comments: Patient has tenderness palpation along the right anterior thigh.  There is no visible deformity, no edema, induration.  She is holding her right leg in external rotation.  Skin:    General: Skin is warm and dry.  Neurological:     Mental Status: She is alert.     Comments: Hard of hearing      ED Treatments / Results  Labs (all labs ordered are listed, but only abnormal results are displayed) Labs Reviewed  NOVEL CORONAVIRUS, NAA (HOSP ORDER, SEND-OUT TO REF LAB; TAT 18-24 HRS)    EKG None  Radiology Ct Head Wo Contrast  Result Date: 09/11/2018 CLINICAL  DATA:  Head trauma EXAM: CT HEAD WITHOUT CONTRAST CT CERVICAL SPINE WITHOUT CONTRAST TECHNIQUE: Multidetector CT imaging of the head and cervical spine was performed following the standard protocol without intravenous contrast. Multiplanar CT image reconstructions of the cervical spine were also generated. COMPARISON:  CT head May 29, 2018, CT head and cervical spine March 28, 2018 cyst FINDINGS: CT HEAD FINDINGS Brain: Stable regions of gliosis in the inferior left frontal lobe, right cerebellar hemisphere and bilateral occipital lobes. No evidence of acute infarction, hemorrhage,  hydrocephalus, extra-axial collection or mass lesion/mass effect. Symmetric prominence of the ventricles, cisterns and sulci compatible with parenchymal volume loss. Patchy areas of white matter hypoattenuation are most compatible with chronic microvascular angiopathy. Vascular: Atherosclerotic calcification of the carotid siphons and intradural vertebral arteries. Skull: Right parietal scalp swelling and infiltration without subjacent calvarial fracture or other acute osseous abnormality. No suspicious osseous lesions. Hyperostosis frontalis interna, a benign incidental finding. Sinuses/Orbits: Minimal pansinus mural disease. No air-fluid levels. Likely senescent scleral plaques. Prior lens extractions. Orbital structures are otherwise unremarkable. Other: None. CT CERVICAL SPINE FINDINGS Alignment: Degenerative exaggeration of the normal cervical lordosis without traumatic listhesis. No abnormal facet widening. Normal alignment of the craniocervical and atlantoaxial articulations. Skull base and vertebrae: No acute fracture. No primary bone lesion or focal pathologic process. Soft tissues and spinal canal: No pre or paravertebral fluid or swelling. No visible canal hematoma. Disc levels: Multilevel intervertebral disc height loss with spondylitic endplate changes. Multilevel posterior disc osteophyte complexes efface the ventral thecal  sac without significant spinal canal stenosis. Uncinate spurring and facet hypertrophic changes result in at most moderate right foraminal narrowing at C3-4 and C4-5. No other significant canal or foraminal stenosis. Upper chest: Heterogeneous multinodular thyroid. Largest nodule in the posterior left gland measures up to 2.2 cm. Atheromatous plaque within the cervical carotids and great vessels. Other: None. IMPRESSION: 1. No acute intracranial abnormality. 2. Right parietal scalp swelling and infiltration without subjacent calvarial fracture. 3. Areas of gliosis in the right cerebellum, bilateral occipital lobes and inferior left frontal lobe compatible remote infarct. Background of volume loss and chronic microvascular ischemic changes with intracranial atherosclerosis. 4. No acute cervical spine fracture. 5. Multilevel cervical spondylosis detailed above. Maximal changes C3-4 C4-5. 6. Multinodular thyroid gland. Largest nodule measuring 2.2 cm in left lobe. Could consider follow-up thyroid ultrasound following discussion with the patient on the clinical utility of this exam. This follows ACR consensus guidelines: Managing Incidental Thyroid Nodules Detected on Imaging: White Paper of the ACR Incidental Thyroid Findings Committee. J Am Coll Radiol 2015; 12:143-150. Electronically Signed   By: Lovena Le M.D.   On: 09/11/2018 20:36   Ct Cervical Spine Wo Contrast  Result Date: 09/11/2018 CLINICAL DATA:  Head trauma EXAM: CT HEAD WITHOUT CONTRAST CT CERVICAL SPINE WITHOUT CONTRAST TECHNIQUE: Multidetector CT imaging of the head and cervical spine was performed following the standard protocol without intravenous contrast. Multiplanar CT image reconstructions of the cervical spine were also generated. COMPARISON:  CT head May 29, 2018, CT head and cervical spine March 28, 2018 cyst FINDINGS: CT HEAD FINDINGS Brain: Stable regions of gliosis in the inferior left frontal lobe, right cerebellar hemisphere and  bilateral occipital lobes. No evidence of acute infarction, hemorrhage, hydrocephalus, extra-axial collection or mass lesion/mass effect. Symmetric prominence of the ventricles, cisterns and sulci compatible with parenchymal volume loss. Patchy areas of white matter hypoattenuation are most compatible with chronic microvascular angiopathy. Vascular: Atherosclerotic calcification of the carotid siphons and intradural vertebral arteries. Skull: Right parietal scalp swelling and infiltration without subjacent calvarial fracture or other acute osseous abnormality. No suspicious osseous lesions. Hyperostosis frontalis interna, a benign incidental finding. Sinuses/Orbits: Minimal pansinus mural disease. No air-fluid levels. Likely senescent scleral plaques. Prior lens extractions. Orbital structures are otherwise unremarkable. Other: None. CT CERVICAL SPINE FINDINGS Alignment: Degenerative exaggeration of the normal cervical lordosis without traumatic listhesis. No abnormal facet widening. Normal alignment of the craniocervical and atlantoaxial articulations. Skull base and vertebrae: No acute fracture. No primary bone lesion or focal pathologic  process. Soft tissues and spinal canal: No pre or paravertebral fluid or swelling. No visible canal hematoma. Disc levels: Multilevel intervertebral disc height loss with spondylitic endplate changes. Multilevel posterior disc osteophyte complexes efface the ventral thecal sac without significant spinal canal stenosis. Uncinate spurring and facet hypertrophic changes result in at most moderate right foraminal narrowing at C3-4 and C4-5. No other significant canal or foraminal stenosis. Upper chest: Heterogeneous multinodular thyroid. Largest nodule in the posterior left gland measures up to 2.2 cm. Atheromatous plaque within the cervical carotids and great vessels. Other: None. IMPRESSION: 1. No acute intracranial abnormality. 2. Right parietal scalp swelling and infiltration  without subjacent calvarial fracture. 3. Areas of gliosis in the right cerebellum, bilateral occipital lobes and inferior left frontal lobe compatible remote infarct. Background of volume loss and chronic microvascular ischemic changes with intracranial atherosclerosis. 4. No acute cervical spine fracture. 5. Multilevel cervical spondylosis detailed above. Maximal changes C3-4 C4-5. 6. Multinodular thyroid gland. Largest nodule measuring 2.2 cm in left lobe. Could consider follow-up thyroid ultrasound following discussion with the patient on the clinical utility of this exam. This follows ACR consensus guidelines: Managing Incidental Thyroid Nodules Detected on Imaging: White Paper of the ACR Incidental Thyroid Findings Committee. J Am Coll Radiol 2015; 12:143-150. Electronically Signed   By: Lovena Le M.D.   On: 09/11/2018 20:36   Dg Femur Min 2 Views Right  Result Date: 09/11/2018 CLINICAL DATA:  Right hip pain after fall. EXAM: RIGHT FEMUR 2 VIEWS COMPARISON:  Radiograph of March 28, 2018. FINDINGS: Status post right total hip arthroplasty with surgical internal fixation of the distal right femur. No definite evidence of fracture is seen involving the femur. Nondisplaced fracture is seen involving the right inferior pubic ramus. IMPRESSION: Postsurgical changes as described above. No acute abnormality seen involving the right femur. Nondisplaced right inferior pubic ramus fracture is noted. Electronically Signed   By: Marijo Conception M.D.   On: 09/11/2018 20:43   Dg Hips Bilat W Or Wo Pelvis 3-4 Views  Result Date: 09/11/2018 CLINICAL DATA:  Right hip pain after fall. EXAM: DG HIP (WITH OR WITHOUT PELVIS) 3-4V BILAT COMPARISON:  None. FINDINGS: Status post right total hip arthroplasty. Left hip appears normal. Nondisplaced fracture is seen involving right inferior pubic ramus. No other bony abnormality is noted. Calcified degenerating fibroids are noted in the pelvis. Diffuse osteopenia is noted.  IMPRESSION: Nondisplaced fracture is seen involving right inferior pubic ramus. Status post right total hip arthroplasty. Electronically Signed   By: Marijo Conception M.D.   On: 09/11/2018 20:46    Procedures Procedures (including critical care time)  Medications Ordered in ED Medications  acetaminophen (TYLENOL) tablet 650 mg (650 mg Oral Given 09/11/18 2323)     Initial Impression / Assessment and Plan / ED Course  I have reviewed the triage vital signs and the nursing notes.  Pertinent labs & imaging results that were available during my care of the patient were reviewed by me and considered in my medical decision making (see chart for details).        Pt is a resident at [facility], a local assisted living facility.  Per staff, pt is normally ambulatory using a walker, if unable to weight bear she may not be able to return to their facility. Pt appears comfortable at rest, but any attempt to flex her right hip or sit up on the bed caused pain. Tylenol given. Pt will require overnight observation and consideration for placement in  a skilled care setting if [facility] is unable to accommodate pt.  Discussed with Dr. Darrick Meigs who will see and admit pt.   Final Clinical Impressions(s) / ED Diagnoses   Final diagnoses:  Pubic ramus fracture, right, closed, initial encounter [facility])    ED Discharge Orders    None       Landis Martins 09/11/18 2325    Nat Christen, MD 09/12/18 [phone]

## 2018-09-12 ENCOUNTER — Other Ambulatory Visit: Payer: Self-pay

## 2018-09-12 DIAGNOSIS — S329XXA Fracture of unspecified parts of lumbosacral spine and pelvis, initial encounter for closed fracture: Secondary | ICD-10-CM | POA: Diagnosis present

## 2018-09-12 DIAGNOSIS — M84454A Pathological fracture, pelvis, initial encounter for fracture: Secondary | ICD-10-CM | POA: Diagnosis not present

## 2018-09-12 DIAGNOSIS — S32591A Other specified fracture of right pubis, initial encounter for closed fracture: Secondary | ICD-10-CM | POA: Diagnosis not present

## 2018-09-12 LAB — CBC
HCT: 38.8 % (ref 36.0–46.0)
Hemoglobin: 12.9 g/dL (ref 12.0–15.0)
MCH: 30.8 pg (ref 26.0–34.0)
MCHC: 33.2 g/dL (ref 30.0–36.0)
MCV: 92.6 fL (ref 80.0–100.0)
Platelets: 214 10*3/uL (ref 150–400)
RBC: 4.19 MIL/uL (ref 3.87–5.11)
RDW: 13.3 % (ref 11.5–15.5)
WBC: 8.6 10*3/uL (ref 4.0–10.5)
nRBC: 0 % (ref 0.0–0.2)

## 2018-09-12 LAB — COMPREHENSIVE METABOLIC PANEL
ALT: 17 U/L (ref 0–44)
AST: 21 U/L (ref 15–41)
Albumin: 4.2 g/dL (ref 3.5–5.0)
Alkaline Phosphatase: 92 U/L (ref 38–126)
Anion gap: 12 (ref 5–15)
BUN: 18 mg/dL (ref 8–23)
CO2: 25 mmol/L (ref 22–32)
Calcium: 8.9 mg/dL (ref 8.9–10.3)
Chloride: 103 mmol/L (ref 98–111)
Creatinine, Ser: 0.57 mg/dL (ref 0.44–1.00)
GFR calc Af Amer: >60 mL/min (ref >60)
GFR calc non Af Amer: >60 mL/min (ref >60)
Glucose, Bld: 113 mg/dL — ABNORMAL HIGH (ref 70–99)
Potassium: 3.8 mmol/L (ref 3.5–5.1)
Sodium: 140 mmol/L (ref 135–145)
Total Bilirubin: 1.5 mg/dL — ABNORMAL HIGH (ref 0.3–1.2)
Total Protein: 7.3 g/dL (ref 6.5–8.1)

## 2018-09-12 LAB — SARS CORONAVIRUS 2 (TAT 6-24 HRS): SARS Coronavirus 2: NEGATIVE

## 2018-09-12 MED ORDER — LORAZEPAM 0.5 MG PO TABS
0.5000 mg | ORAL_TABLET | ORAL | Status: DC | PRN
  Filled 2018-09-12 (×2): qty 1

## 2018-09-12 MED ORDER — ACETAMINOPHEN 325 MG PO TABS
650.0000 mg | ORAL_TABLET | Freq: Four times a day (QID) | ORAL | Status: DC | PRN
  Administered 2018-09-14: 02:00:00 650 mg via ORAL
  Filled 2018-09-12: qty 2

## 2018-09-12 MED ORDER — SODIUM CHLORIDE 0.9 % IV SOLN: INTRAVENOUS | Status: DC

## 2018-09-12 MED ORDER — SERTRALINE HCL 50 MG PO TABS
50.0000 mg | ORAL_TABLET | Freq: Every morning | ORAL | Status: DC
  Administered 2018-09-13 – 2018-09-21 (×9): 50 mg via ORAL
  Filled 2018-09-12 (×10): qty 1

## 2018-09-12 MED ORDER — METOPROLOL TARTRATE 25 MG PO TABS
25.0000 mg | ORAL_TABLET | Freq: Two times a day (BID) | ORAL | Status: DC
  Administered 2018-09-13 – 2018-09-21 (×17): 25 mg via ORAL
  Filled 2018-09-12 (×20): qty 1

## 2018-09-12 MED ORDER — HYDRALAZINE HCL 20 MG/ML IJ SOLN
5.0000 mg | Freq: Once | INTRAMUSCULAR | Status: AC
  Administered 2018-09-12: 22:00:00 5 mg via INTRAVENOUS
  Filled 2018-09-12: qty 1

## 2018-09-12 MED ORDER — AMLODIPINE BESYLATE 5 MG PO TABS
10.0000 mg | ORAL_TABLET | Freq: Every day | ORAL | Status: DC
  Administered 2018-09-13 – 2018-09-21 (×9): 10 mg via ORAL
  Filled 2018-09-12 (×10): qty 2

## 2018-09-12 MED ORDER — LORAZEPAM 2 MG/ML IJ SOLN
0.5000 mg | Freq: Four times a day (QID) | INTRAMUSCULAR | Status: DC | PRN
  Administered 2018-09-12 – 2018-09-13 (×3): 0.5 mg via INTRAVENOUS
  Filled 2018-09-12 (×4): qty 1

## 2018-09-12 MED ORDER — BISACODYL 10 MG RE SUPP
10.0000 mg | Freq: Every day | RECTAL | Status: DC | PRN
  Administered 2018-09-14: 10:00:00 10 mg via RECTAL
  Filled 2018-09-12: qty 1

## 2018-09-12 MED ORDER — BENAZEPRIL HCL 10 MG PO TABS
20.0000 mg | ORAL_TABLET | Freq: Every day | ORAL | Status: DC
  Administered 2018-09-13 – 2018-09-21 (×9): 20 mg via ORAL
  Filled 2018-09-12 (×2): qty 2
  Filled 2018-09-12: qty 1
  Filled 2018-09-12: qty 2
  Filled 2018-09-12: qty 1
  Filled 2018-09-12 (×2): qty 2
  Filled 2018-09-12: qty 1
  Filled 2018-09-12 (×5): qty 2

## 2018-09-12 MED ORDER — MIRABEGRON ER 25 MG PO TB24
25.0000 mg | ORAL_TABLET | Freq: Every morning | ORAL | Status: DC
  Administered 2018-09-15 – 2018-09-21 (×7): 25 mg via ORAL
  Filled 2018-09-12 (×12): qty 1

## 2018-09-12 MED ORDER — ENOXAPARIN SODIUM 30 MG/0.3ML ~~LOC~~ SOLN
30.0000 mg | SUBCUTANEOUS | Status: DC
  Administered 2018-09-13 – 2018-09-21 (×8): 30 mg via SUBCUTANEOUS
  Filled 2018-09-12 (×9): qty 0.3

## 2018-09-12 MED ORDER — ACETAMINOPHEN 650 MG RE SUPP: 650.0000 mg | Freq: Four times a day (QID) | RECTAL | Status: DC | PRN

## 2018-09-12 MED ORDER — MORPHINE SULFATE (PF) 4 MG/ML IV SOLN
4.0000 mg | INTRAVENOUS | Status: DC | PRN
  Administered 2018-09-12: 4 mg via INTRAVENOUS
  Filled 2018-09-12 (×2): qty 1

## 2018-09-12 MED ORDER — TRAZODONE HCL 50 MG PO TABS
50.0000 mg | ORAL_TABLET | Freq: Every day | ORAL | Status: DC
  Administered 2018-09-13 – 2018-09-20 (×8): 50 mg via ORAL
  Filled 2018-09-12 (×9): qty 1

## 2018-09-12 MED ORDER — OXYCODONE HCL 5 MG PO TABS
5.0000 mg | ORAL_TABLET | Freq: Four times a day (QID) | ORAL | Status: DC | PRN
  Administered 2018-09-14 – 2018-09-15 (×2): 5 mg via ORAL
  Filled 2018-09-12 (×2): qty 1

## 2018-09-12 MED ORDER — ONDANSETRON HCL 4 MG/2ML IJ SOLN
4.0000 mg | Freq: Four times a day (QID) | INTRAMUSCULAR | Status: DC | PRN
  Administered 2018-09-12: 4 mg via INTRAVENOUS
  Filled 2018-09-12: qty 2

## 2018-09-12 MED ORDER — ONDANSETRON HCL 4 MG PO TABS: 4.0000 mg | ORAL_TABLET | Freq: Four times a day (QID) | ORAL | Status: DC | PRN

## 2018-09-12 NOTE — Progress Notes (Signed)
Patient refusing all assistance. Willing to have face washed, however, confusion remains, refusing fluids, food, and medication. MD notified. Elodia Florence RN 09/12/2018 @0900 

## 2018-09-12 NOTE — Care Management Obs Status (Signed)
MEDICARE OBSERVATION STATUS NOTIFICATION   Patient Details  Name: [name] MRN: [id number] Date of Birth: Jan 01, 1921   Medicare Observation Status Notification Given:  Yes(daughter was contacted due to patient being on contact precautions. A copy was placed in the patinet's room.)    Tommy Medal 09/12/2018, 4:09 PM

## 2018-09-12 NOTE — Plan of Care (Signed)
  Problem: Education: Goal: Knowledge of General Education information will improve Description: Including pain rating scale, medication(s)/side effects and non-pharmacologic comfort measures Outcome: Not Progressing   Problem: Coping: Goal: Level of anxiety will decrease Outcome: Not Progressing   Problem: Nutrition: Goal: Adequate nutrition will be maintained Outcome: Not Progressing

## 2018-09-12 NOTE — H&P (Signed)
TRH H&P    Patient Demographics:    [name], is a 85 y.o. female  MRN: [id number]  DOB - [birth year]  Admit Date - 09/11/2018  Referring MD/NP/PA: Evalee Jefferson  Outpatient Primary MD for the patient is Sinda Du, MD  Patient coming from: Assisted living facility  Chief complaint-fall   HPI:    [name]  is a 85 y.o. female, with history of dementia, hearing loss, hypertension, CVA was brought to ED from assisted-living facility.  Patient had an unwitnessed fall at the facility.  She complained of increasing pain in the right hip and thigh but apparently has been ambulatory at the facility.  Patient has dementia and hearing impaired, so history is limited.  CT head and cervical spine showed no acute injury.   Review of systems:    Unable to obtain due to patient's underlying dementia.    Past History of the following :    Past Medical History:  Diagnosis Date   Anxiety    Dementia ([place])    Hypertension    Overactive bladder    Pneumonia    Stroke [facility])       Past Surgical History:  Procedure Laterality Date   HIP ADDUCTOR TENOTOMY     JOINT REPLACEMENT        Social History:      Social History   Tobacco Use   Smoking status: Never Smoker   Smokeless tobacco: Never Used  Substance Use Topics   Alcohol use: No       Family History :   Unable to obtain due to patient's underlying dementia   Home Medications:   Prior to Admission medications   Medication Sig Start Date End Date Taking? Authorizing Provider  acetaminophen (TYLENOL) 500 MG tablet Take 1,000 mg by mouth at bedtime.    Yes [provider]  amLODipine (NORVASC) 10 MG tablet Take 1 tablet (10 mg total) by mouth daily. 06/04/18  Yes Sinda Du, MD  benazepril (LOTENSIN) 20 MG tablet Take 1 tablet (20 mg total) by mouth daily. 06/04/18  Yes Sinda Du, MD  bisacodyl  (DULCOLAX) 10 MG suppository Place 1 suppository (10 mg total) rectally daily as needed for moderate constipation. 02/12/18  Yes Sinda Du, MD  conjugated estrogens (PREMARIN) vaginal cream Place 1 Applicatorful vaginally at bedtime.   Yes [provider]  LORazepam (ATIVAN) 0.5 MG tablet Take 1 tablet (0.5 mg total) by mouth every 4 (four) hours as needed for anxiety or sedation. 02/12/18  Yes Sinda Du, MD  metoprolol tartrate (LOPRESSOR) 25 MG tablet Take 1 tablet (25 mg total) by mouth 2 (two) times daily. 02/12/18  Yes Sinda Du, MD  mirabegron ER (MYRBETRIQ) 25 MG TB24 tablet Take 25 mg by mouth every morning.    Yes [provider]  sertraline (ZOLOFT) 50 MG tablet Take 50 mg by mouth every morning.  06/24/16  Yes [provider]  traZODone (DESYREL) 50 MG tablet Take 50 mg by mouth at bedtime.   Yes [provider]  Allergies:     Allergies  Allergen Reactions   Sulfa Antibiotics     Not listed on MAR provided by Nursing facility     Physical Exam:   Vitals  Blood pressure (!) 150/62, pulse 81, temperature 98.6 F (37 C), resp. rate 18, SpO2 100 %.  1.  General: Appears in no acute distress  2. Psychiatric: Alert, oriented to self  3. Neurologic: Moving all extremities, no focal deficit noted  4. HEENMT:  Atraumatic normocephalic  5. Respiratory : Clear to auscultation bilaterally  6. Cardiovascular : S1-S2, regular, no murmur auscultated  7. Gastrointestinal:  Abdomen is soft, nontender, no organomegaly      Data Review:    CBC No results for input(s): WBC, HGB, HCT, PLT, MCV, MCH, MCHC, RDW, LYMPHSABS, MONOABS, EOSABS, BASOSABS, BANDABS in the last 168 hours.  Invalid input(s): NEUTRABS, BANDSABD ------------------------------------------------------------------------------------------------------------------  No results found for this or any previous visit (from the past 33  hour(s)).  Chemistries  No results for input(s): NA, K, CL, CO2, GLUCOSE, BUN, CREATININE, CALCIUM, MG, AST, ALT, ALKPHOS, BILITOT in the last 168 hours.  Invalid input(s): GFRCGP ------------------------------------------------------------------------------------------------------------------   --------------------------------------------------------------------------------------------------------------- Urine analysis:    Component Value Date/Time   COLORURINE STRAW (A) 05/29/2018 0816   APPEARANCEUR HAZY (A) 05/29/2018 0816   LABSPEC 1.009 05/29/2018 0816   PHURINE 8.0 05/29/2018 0816   GLUCOSEU 50 (A) 05/29/2018 0816   HGBUR SMALL (A) 05/29/2018 0816   BILIRUBINUR NEGATIVE 05/29/2018 0816   KETONESUR NEGATIVE 05/29/2018 0816   PROTEINUR 100 (A) 05/29/2018 0816   UROBILINOGEN 0.2 02/28/2012 1830   NITRITE NEGATIVE 05/29/2018 0816   LEUKOCYTESUR NEGATIVE 05/29/2018 0816      Imaging Results:    Ct Head Wo Contrast  Result Date: 09/11/2018 CLINICAL DATA:  Head trauma EXAM: CT HEAD WITHOUT CONTRAST CT CERVICAL SPINE WITHOUT CONTRAST TECHNIQUE: Multidetector CT imaging of the head and cervical spine was performed following the standard protocol without intravenous contrast. Multiplanar CT image reconstructions of the cervical spine were also generated. COMPARISON:  CT head May 29, 2018, CT head and cervical spine March 28, 2018 cyst FINDINGS: CT HEAD FINDINGS Brain: Stable regions of gliosis in the inferior left frontal lobe, right cerebellar hemisphere and bilateral occipital lobes. No evidence of acute infarction, hemorrhage, hydrocephalus, extra-axial collection or mass lesion/mass effect. Symmetric prominence of the ventricles, cisterns and sulci compatible with parenchymal volume loss. Patchy areas of white matter hypoattenuation are most compatible with chronic microvascular angiopathy. Vascular: Atherosclerotic calcification of the carotid siphons and intradural vertebral  arteries. Skull: Right parietal scalp swelling and infiltration without subjacent calvarial fracture or other acute osseous abnormality. No suspicious osseous lesions. Hyperostosis frontalis interna, a benign incidental finding. Sinuses/Orbits: Minimal pansinus mural disease. No air-fluid levels. Likely senescent scleral plaques. Prior lens extractions. Orbital structures are otherwise unremarkable. Other: None. CT CERVICAL SPINE FINDINGS Alignment: Degenerative exaggeration of the normal cervical lordosis without traumatic listhesis. No abnormal facet widening. Normal alignment of the craniocervical and atlantoaxial articulations. Skull base and vertebrae: No acute fracture. No primary bone lesion or focal pathologic process. Soft tissues and spinal canal: No pre or paravertebral fluid or swelling. No visible canal hematoma. Disc levels: Multilevel intervertebral disc height loss with spondylitic endplate changes. Multilevel posterior disc osteophyte complexes efface the ventral thecal sac without significant spinal canal stenosis. Uncinate spurring and facet hypertrophic changes result in at most moderate right foraminal narrowing at C3-4 and C4-5. No other significant canal or foraminal stenosis. Upper chest: Heterogeneous multinodular thyroid. Largest  nodule in the posterior left gland measures up to 2.2 cm. Atheromatous plaque within the cervical carotids and great vessels. Other: None. IMPRESSION: 1. No acute intracranial abnormality. 2. Right parietal scalp swelling and infiltration without subjacent calvarial fracture. 3. Areas of gliosis in the right cerebellum, bilateral occipital lobes and inferior left frontal lobe compatible remote infarct. Background of volume loss and chronic microvascular ischemic changes with intracranial atherosclerosis. 4. No acute cervical spine fracture. 5. Multilevel cervical spondylosis detailed above. Maximal changes C3-4 C4-5. 6. Multinodular thyroid gland. Largest nodule  measuring 2.2 cm in left lobe. Could consider follow-up thyroid ultrasound following discussion with the patient on the clinical utility of this exam. This follows ACR consensus guidelines: Managing Incidental Thyroid Nodules Detected on Imaging: White Paper of the ACR Incidental Thyroid Findings Committee. J Am Coll Radiol 2015; 12:143-150. Electronically Signed   By: Lovena Le M.D.   On: 09/11/2018 20:36   Ct Cervical Spine Wo Contrast  Result Date: 09/11/2018 CLINICAL DATA:  Head trauma EXAM: CT HEAD WITHOUT CONTRAST CT CERVICAL SPINE WITHOUT CONTRAST TECHNIQUE: Multidetector CT imaging of the head and cervical spine was performed following the standard protocol without intravenous contrast. Multiplanar CT image reconstructions of the cervical spine were also generated. COMPARISON:  CT head May 29, 2018, CT head and cervical spine March 28, 2018 cyst FINDINGS: CT HEAD FINDINGS Brain: Stable regions of gliosis in the inferior left frontal lobe, right cerebellar hemisphere and bilateral occipital lobes. No evidence of acute infarction, hemorrhage, hydrocephalus, extra-axial collection or mass lesion/mass effect. Symmetric prominence of the ventricles, cisterns and sulci compatible with parenchymal volume loss. Patchy areas of white matter hypoattenuation are most compatible with chronic microvascular angiopathy. Vascular: Atherosclerotic calcification of the carotid siphons and intradural vertebral arteries. Skull: Right parietal scalp swelling and infiltration without subjacent calvarial fracture or other acute osseous abnormality. No suspicious osseous lesions. Hyperostosis frontalis interna, a benign incidental finding. Sinuses/Orbits: Minimal pansinus mural disease. No air-fluid levels. Likely senescent scleral plaques. Prior lens extractions. Orbital structures are otherwise unremarkable. Other: None. CT CERVICAL SPINE FINDINGS Alignment: Degenerative exaggeration of the normal cervical lordosis without  traumatic listhesis. No abnormal facet widening. Normal alignment of the craniocervical and atlantoaxial articulations. Skull base and vertebrae: No acute fracture. No primary bone lesion or focal pathologic process. Soft tissues and spinal canal: No pre or paravertebral fluid or swelling. No visible canal hematoma. Disc levels: Multilevel intervertebral disc height loss with spondylitic endplate changes. Multilevel posterior disc osteophyte complexes efface the ventral thecal sac without significant spinal canal stenosis. Uncinate spurring and facet hypertrophic changes result in at most moderate right foraminal narrowing at C3-4 and C4-5. No other significant canal or foraminal stenosis. Upper chest: Heterogeneous multinodular thyroid. Largest nodule in the posterior left gland measures up to 2.2 cm. Atheromatous plaque within the cervical carotids and great vessels. Other: None. IMPRESSION: 1. No acute intracranial abnormality. 2. Right parietal scalp swelling and infiltration without subjacent calvarial fracture. 3. Areas of gliosis in the right cerebellum, bilateral occipital lobes and inferior left frontal lobe compatible remote infarct. Background of volume loss and chronic microvascular ischemic changes with intracranial atherosclerosis. 4. No acute cervical spine fracture. 5. Multilevel cervical spondylosis detailed above. Maximal changes C3-4 C4-5. 6. Multinodular thyroid gland. Largest nodule measuring 2.2 cm in left lobe. Could consider follow-up thyroid ultrasound following discussion with the patient on the clinical utility of this exam. This follows ACR consensus guidelines: Managing Incidental Thyroid Nodules Detected on Imaging: White Paper of the ACR Incidental Thyroid  Findings Committee. J Am Coll Radiol 2015; 12:143-150. Electronically Signed   By: Lovena Le M.D.   On: 09/11/2018 20:36   Dg Femur Min 2 Views Right  Result Date: 09/11/2018 CLINICAL DATA:  Right hip pain after fall. EXAM:  RIGHT FEMUR 2 VIEWS COMPARISON:  Radiograph of March 28, 2018. FINDINGS: Status post right total hip arthroplasty with surgical internal fixation of the distal right femur. No definite evidence of fracture is seen involving the femur. Nondisplaced fracture is seen involving the right inferior pubic ramus. IMPRESSION: Postsurgical changes as described above. No acute abnormality seen involving the right femur. Nondisplaced right inferior pubic ramus fracture is noted. Electronically Signed   By: Marijo Conception M.D.   On: 09/11/2018 20:43   Dg Hips Bilat W Or Wo Pelvis 3-4 Views  Result Date: 09/11/2018 CLINICAL DATA:  Right hip pain after fall. EXAM: DG HIP (WITH OR WITHOUT PELVIS) 3-4V BILAT COMPARISON:  None. FINDINGS: Status post right total hip arthroplasty. Left hip appears normal. Nondisplaced fracture is seen involving right inferior pubic ramus. No other bony abnormality is noted. Calcified degenerating fibroids are noted in the pelvis. Diffuse osteopenia is noted. IMPRESSION: Nondisplaced fracture is seen involving right inferior pubic ramus. Status post right total hip arthroplasty. Electronically Signed   By: Marijo Conception M.D.   On: 09/11/2018 20:46      Assessment & Plan:    Active Problems:   Pelvic fracture (HCC)   1. Pelvic fracture-x-ray of the pelvis shows nondisplaced fracture involving right inferior pubic ramus.  Pain is controlled with Tylenol.  Will obtain PT evaluation in a.m.  Likely will be able to discharge to facility in next 24 hours.  Also start oxycodone 5 mg every 6 hours PRN for severe pain.  2. Hypertension-continue Norvasc, Lotensin, metoprolol   3. Anxiety/depression-continue sertraline, Myrbetriq, Ativan as needed     DVT Prophylaxis-   Lovenox   AM Labs Ordered, also please review Full Orders  Family Communication: No family at bedside  Code Status: DNR  Admission status: Observation: Based on patients clinical presentation and evaluation of above  clinical data, I have made determination that patient will need less than 2 midnight stay in the hospital.  Time spent in minutes : 60 minutes   Oswald Hillock M.D on 09/12/2018 at 12:34 AM

## 2018-09-12 NOTE — Evaluation (Signed)
Physical Therapy Evaluation Patient Details Name: [name] MRN: [id number] DOB: 12/13/1920 Today's Date: 09/12/2018   History of Present Illness  [name]  is a 85 y.o. female, with history of dementia, hearing loss, hypertension, CVA was brought to ED from assisted-living facility.  Patient had an unwitnessed fall at the facility.  She complained of increasing pain in the right hip and thigh but apparently has been ambulatory at the facility.  Patient has dementia and hearing impaired, so history is limited.    Clinical Impression  Patient agreeable to therapy but confused and not oriented to time/place/location. Able to perform bed mobilities and sit to stand transfers with min/mod assist with frequent verbal and tactile cues. Unable to take any steps at bedside today. Patient started dry heaving with transition to stand. Returned patient to bed and dry heaving subsided, notified nursing.  Patient will continue to benefit from skilled physical therapy in hospital and recommended venue below to increase strength, balance, endurance for safe ADLs and gait.    Follow Up Recommendations SNF;Supervision/Assistance - 24 hour    Equipment Recommendations       Recommendations for Other Services       Precautions / Restrictions Precautions Precautions: Fall Restrictions Weight Bearing Restrictions: Yes RUE Weight Bearing: Weight bearing as tolerated      Mobility  Bed Mobility Overal bed mobility: Needs Assistance Bed Mobility: Rolling;Sidelying to Sit;Supine to Sit Rolling: Min assist Sidelying to sit: Min assist Supine to sit: Mod assist;Min assist     General bed mobility comments: verbal and tactile cues  Transfers Overall transfer level: Needs assistance Equipment used: Rolling walker (2 wheeled) Transfers: Sit to/from [facility] Sit to Stand: Min assist Stand pivot transfers: Min assist       General transfer comment: verbal and tactile  cues  Ambulation/Gait         Gait velocity: unable      Stairs            Wheelchair Mobility    Modified Rankin (Stroke Patients Only)       Balance Overall balance assessment: Needs assistance Sitting-balance support: Feet supported Sitting balance-Leahy Scale: Fair       Standing balance-Leahy Scale: Poor                               Pertinent Vitals/Pain Pain Assessment: No/denies pain    Home Living Family/patient expects to be discharged to:: Assisted living                      Prior Function Level of Independence: Needs assistance         Comments: unable to determine- poor historian     Hand Dominance        Extremity/Trunk Assessment   Upper Extremity Assessment Upper Extremity Assessment: Generalized weakness    Lower Extremity Assessment Lower Extremity Assessment: Generalized weakness    Cervical / Trunk Assessment Cervical / Trunk Assessment: Kyphotic  Communication   Communication: HOH;Receptive difficulties;Expressive difficulties  Cognition Arousal/Alertness: Lethargic Behavior During Therapy: WFL for tasks assessed/performed Overall Cognitive Status: Difficult to assess                                        General Comments      Exercises     Assessment/Plan  PT Assessment Patient needs continued PT services  PT Problem List Decreased strength;Decreased mobility;Decreased range of motion;Decreased activity tolerance;Decreased balance;Decreased safety awareness       PT Treatment Interventions Gait training;Functional mobility training;Therapeutic activities;Therapeutic exercise;Balance training    PT Goals (Current goals can be found in the Care Plan section)  Acute Rehab PT Goals Patient Stated Goal: unable to communicate PT Goal Formulation: With patient Time For Goal Achievement: 09/25/18    Frequency Min 3X/week   Barriers to discharge   came from ALF -  poor historian    Co-evaluation               AM-PAC PT "6 Clicks" Mobility  Outcome Measure Help needed turning from your back to your side while in a flat bed without using bedrails?: A Little Help needed moving from lying on your back to sitting on the side of a flat bed without using bedrails?: A Little Help needed moving to and from a bed to a chair (including a wheelchair)?: A Lot Help needed standing up from a chair using your arms (e.g., wheelchair or bedside chair)?: A Little Help needed to walk in hospital room?: A Lot Help needed climbing 3-5 steps with a railing? : Total 6 Click Score: 14    End of Session   Activity Tolerance: Other (comment)(dry heaving with transition to stand) Patient left: in bed;with call bell/phone within reach;with bed alarm set Nurse Communication: Mobility status;Precautions PT Visit Diagnosis: Unsteadiness on feet (R26.81);Other abnormalities of gait and mobility (R26.89);Muscle weakness (generalized) (M62.81);Difficulty in walking, not elsewhere classified (R26.2)    Time: [id number] PT Time Calculation (min) (ACUTE ONLY): 20 min   Charges:   PT Evaluation $PT Eval Moderate Complexity: 1 Mod PT Treatments $Therapeutic Activity: 8-22 mins       3:48 PM, 09/12/18 Jerene Pitch, DPT Physical Therapy with [facility]  [phone] office

## 2018-09-12 NOTE — Progress Notes (Addendum)
Subjective: Confused.  Not answering questions no new problems noted.  COVID screen pending  Objective: Vital signs in last 24 hours: Temp:  [97.1 F (36.2 C)-98.6 F (37 C)] 97.1 F (36.2 C) (08/26 0600) Pulse Rate:  [73-81] 78 (08/26 0600) Resp:  [18-20] 20 (08/26 0600) BP: (132-168)/(62-99) 137/63 (08/26 0600) SpO2:  [96 %-100 %] 96 % (08/26 0300) Weight:  [43.6 kg] 43.6 kg (08/26 0212) Weight change:  Last BM Date: (Unknown)  Intake/Output from previous day: 08/25 0701 - 08/26 0700 In: -  Out: 750 [Urine:750]  PHYSICAL EXAM General appearance: Confused.  Arousable.  Sleepy.  She is very hard of hearing Resp: clear to auscultation bilaterally Cardio: regular rate and rhythm, S1, S2 normal, no murmur, click, rub or gallop GI: soft, non-tender; bowel sounds normal; no masses,  no organomegaly Extremities: extremities normal, atraumatic, no cyanosis or edema  Lab Results:  No results found for this or any previous visit (from the past 48 hour(s)).  ABGS No results for input(s): PHART, PO2ART, TCO2, HCO3 in the last 72 hours.  Invalid input(s): PCO2 CULTURES No results found for this or any previous visit (from the past 240 hour(s)). Studies/Results: Ct Head Wo Contrast  Result Date: 09/11/2018 CLINICAL DATA:  Head trauma EXAM: CT HEAD WITHOUT CONTRAST CT CERVICAL SPINE WITHOUT CONTRAST TECHNIQUE: Multidetector CT imaging of the head and cervical spine was performed following the standard protocol without intravenous contrast. Multiplanar CT image reconstructions of the cervical spine were also generated. COMPARISON:  CT head May 29, 2018, CT head and cervical spine March 28, 2018 cyst FINDINGS: CT HEAD FINDINGS Brain: Stable regions of gliosis in the inferior left frontal lobe, right cerebellar hemisphere and bilateral occipital lobes. No evidence of acute infarction, hemorrhage, hydrocephalus, extra-axial collection or mass lesion/mass effect. Symmetric prominence of the  ventricles, cisterns and sulci compatible with parenchymal volume loss. Patchy areas of white matter hypoattenuation are most compatible with chronic microvascular angiopathy. Vascular: Atherosclerotic calcification of the carotid siphons and intradural vertebral arteries. Skull: Right parietal scalp swelling and infiltration without subjacent calvarial fracture or other acute osseous abnormality. No suspicious osseous lesions. Hyperostosis frontalis interna, a benign incidental finding. Sinuses/Orbits: Minimal pansinus mural disease. No air-fluid levels. Likely senescent scleral plaques. Prior lens extractions. Orbital structures are otherwise unremarkable. Other: None. CT CERVICAL SPINE FINDINGS Alignment: Degenerative exaggeration of the normal cervical lordosis without traumatic listhesis. No abnormal facet widening. Normal alignment of the craniocervical and atlantoaxial articulations. Skull base and vertebrae: No acute fracture. No primary bone lesion or focal pathologic process. Soft tissues and spinal canal: No pre or paravertebral fluid or swelling. No visible canal hematoma. Disc levels: Multilevel intervertebral disc height loss with spondylitic endplate changes. Multilevel posterior disc osteophyte complexes efface the ventral thecal sac without significant spinal canal stenosis. Uncinate spurring and facet hypertrophic changes result in at most moderate right foraminal narrowing at C3-4 and C4-5. No other significant canal or foraminal stenosis. Upper chest: Heterogeneous multinodular thyroid. Largest nodule in the posterior left gland measures up to 2.2 cm. Atheromatous plaque within the cervical carotids and great vessels. Other: None. IMPRESSION: 1. No acute intracranial abnormality. 2. Right parietal scalp swelling and infiltration without subjacent calvarial fracture. 3. Areas of gliosis in the right cerebellum, bilateral occipital lobes and inferior left frontal lobe compatible remote infarct.  Background of volume loss and chronic microvascular ischemic changes with intracranial atherosclerosis. 4. No acute cervical spine fracture. 5. Multilevel cervical spondylosis detailed above. Maximal changes C3-4 C4-5. 6. Multinodular thyroid gland.  Largest nodule measuring 2.2 cm in left lobe. Could consider follow-up thyroid ultrasound following discussion with the patient on the clinical utility of this exam. This follows ACR consensus guidelines: Managing Incidental Thyroid Nodules Detected on Imaging: White Paper of the ACR Incidental Thyroid Findings Committee. J Am Coll Radiol 2015; 12:143-150. Electronically Signed   By: Lovena Le M.D.   On: 09/11/2018 20:36   Ct Cervical Spine Wo Contrast  Result Date: 09/11/2018 CLINICAL DATA:  Head trauma EXAM: CT HEAD WITHOUT CONTRAST CT CERVICAL SPINE WITHOUT CONTRAST TECHNIQUE: Multidetector CT imaging of the head and cervical spine was performed following the standard protocol without intravenous contrast. Multiplanar CT image reconstructions of the cervical spine were also generated. COMPARISON:  CT head May 29, 2018, CT head and cervical spine March 28, 2018 cyst FINDINGS: CT HEAD FINDINGS Brain: Stable regions of gliosis in the inferior left frontal lobe, right cerebellar hemisphere and bilateral occipital lobes. No evidence of acute infarction, hemorrhage, hydrocephalus, extra-axial collection or mass lesion/mass effect. Symmetric prominence of the ventricles, cisterns and sulci compatible with parenchymal volume loss. Patchy areas of white matter hypoattenuation are most compatible with chronic microvascular angiopathy. Vascular: Atherosclerotic calcification of the carotid siphons and intradural vertebral arteries. Skull: Right parietal scalp swelling and infiltration without subjacent calvarial fracture or other acute osseous abnormality. No suspicious osseous lesions. Hyperostosis frontalis interna, a benign incidental finding. Sinuses/Orbits: Minimal  pansinus mural disease. No air-fluid levels. Likely senescent scleral plaques. Prior lens extractions. Orbital structures are otherwise unremarkable. Other: None. CT CERVICAL SPINE FINDINGS Alignment: Degenerative exaggeration of the normal cervical lordosis without traumatic listhesis. No abnormal facet widening. Normal alignment of the craniocervical and atlantoaxial articulations. Skull base and vertebrae: No acute fracture. No primary bone lesion or focal pathologic process. Soft tissues and spinal canal: No pre or paravertebral fluid or swelling. No visible canal hematoma. Disc levels: Multilevel intervertebral disc height loss with spondylitic endplate changes. Multilevel posterior disc osteophyte complexes efface the ventral thecal sac without significant spinal canal stenosis. Uncinate spurring and facet hypertrophic changes result in at most moderate right foraminal narrowing at C3-4 and C4-5. No other significant canal or foraminal stenosis. Upper chest: Heterogeneous multinodular thyroid. Largest nodule in the posterior left gland measures up to 2.2 cm. Atheromatous plaque within the cervical carotids and great vessels. Other: None. IMPRESSION: 1. No acute intracranial abnormality. 2. Right parietal scalp swelling and infiltration without subjacent calvarial fracture. 3. Areas of gliosis in the right cerebellum, bilateral occipital lobes and inferior left frontal lobe compatible remote infarct. Background of volume loss and chronic microvascular ischemic changes with intracranial atherosclerosis. 4. No acute cervical spine fracture. 5. Multilevel cervical spondylosis detailed above. Maximal changes C3-4 C4-5. 6. Multinodular thyroid gland. Largest nodule measuring 2.2 cm in left lobe. Could consider follow-up thyroid ultrasound following discussion with the patient on the clinical utility of this exam. This follows ACR consensus guidelines: Managing Incidental Thyroid Nodules Detected on Imaging: White  Paper of the ACR Incidental Thyroid Findings Committee. J Am Coll Radiol 2015; 12:143-150. Electronically Signed   By: Lovena Le M.D.   On: 09/11/2018 20:36   Dg Femur Min 2 Views Right  Result Date: 09/11/2018 CLINICAL DATA:  Right hip pain after fall. EXAM: RIGHT FEMUR 2 VIEWS COMPARISON:  Radiograph of March 28, 2018. FINDINGS: Status post right total hip arthroplasty with surgical internal fixation of the distal right femur. No definite evidence of fracture is seen involving the femur. Nondisplaced fracture is seen involving the right inferior pubic ramus.  IMPRESSION: Postsurgical changes as described above. No acute abnormality seen involving the right femur. Nondisplaced right inferior pubic ramus fracture is noted. Electronically Signed   By: Marijo Conception M.D.   On: 09/11/2018 20:43   Dg Hips Bilat W Or Wo Pelvis 3-4 Views  Result Date: 09/11/2018 CLINICAL DATA:  Right hip pain after fall. EXAM: DG HIP (WITH OR WITHOUT PELVIS) 3-4V BILAT COMPARISON:  None. FINDINGS: Status post right total hip arthroplasty. Left hip appears normal. Nondisplaced fracture is seen involving right inferior pubic ramus. No other bony abnormality is noted. Calcified degenerating fibroids are noted in the pelvis. Diffuse osteopenia is noted. IMPRESSION: Nondisplaced fracture is seen involving right inferior pubic ramus. Status post right total hip arthroplasty. Electronically Signed   By: Marijo Conception M.D.   On: 09/11/2018 20:46    Medications:  Prior to Admission:  Medications Prior to Admission  Medication Sig Dispense Refill Last Dose  . acetaminophen (TYLENOL) 500 MG tablet Take 1,000 mg by mouth at bedtime.    09/10/2018 at Unknown time  . amLODipine (NORVASC) 10 MG tablet Take 1 tablet (10 mg total) by mouth daily. 30 tablet 5 09/11/2018 at Unknown time  . benazepril (LOTENSIN) 20 MG tablet Take 1 tablet (20 mg total) by mouth daily. 30 tablet 5 09/11/2018 at Unknown time  . bisacodyl (DULCOLAX) 10 MG  suppository Place 1 suppository (10 mg total) rectally daily as needed for moderate constipation. 12 suppository 0 unknown  . conjugated estrogens (PREMARIN) vaginal cream Place 1 Applicatorful vaginally at bedtime.   09/10/2018 at Unknown time  . LORazepam (ATIVAN) 0.5 MG tablet Take 1 tablet (0.5 mg total) by mouth every 4 (four) hours as needed for anxiety or sedation. 30 tablet 0 unknown  . metoprolol tartrate (LOPRESSOR) 25 MG tablet Take 1 tablet (25 mg total) by mouth 2 (two) times daily. 60 tablet 5 09/11/2018 at 800  . mirabegron ER (MYRBETRIQ) 25 MG TB24 tablet Take 25 mg by mouth every morning.    09/11/2018 at Unknown time  . sertraline (ZOLOFT) 50 MG tablet Take 50 mg by mouth every morning.    09/11/2018 at Unknown time  . traZODone (DESYREL) 50 MG tablet Take 50 mg by mouth at bedtime.   09/10/2018 at Unknown time   Scheduled: . amLODipine  10 mg Oral Daily  . benazepril  20 mg Oral Daily  . enoxaparin (LOVENOX) injection  40 mg Subcutaneous Q24H  . metoprolol tartrate  25 mg Oral BID  . mirabegron ER  25 mg Oral q morning - 10a  . sertraline  50 mg Oral q morning - 10a  . traZODone  50 mg Oral QHS   Continuous: . sodium chloride     [id number] **OR** acetaminophen, bisacodyl, LORazepam, ondansetron **OR** ondansetron (ZOFRAN) IV, oxyCODONE  Assesment: She was admitted with pelvic fracture after a fall.  Orthopedic consult has been requested  She has hypertension and that is doing okay  She has history of anxiety and depression and she will continue her current medications  She has dementia which complicates her situation Active Problems:   Pelvic fracture ([place])    Plan: Continue treatments.  Orthopedic consultation.  PT consultation.  discussed with daughter by phone    LOS: 0 days   Alonza Bogus 09/12/2018, 7:59 AM

## 2018-09-12 NOTE — Progress Notes (Signed)
Patient uncooperative and refusing to answer any questions. When I ask name, she responds "You should know my name".  I explain why I need to know and she refuses to speak to me.  Will attempt to answer Admissions questions later.

## 2018-09-12 NOTE — ED Notes (Signed)
ED TO INPATIENT HANDOFF REPORT  ED Nurse Name and Phone #: [phone]  S Name/Age/Gender [name] 85 y.o. female Room/Bed: APA08/APA08  Code Status   Code Status: DNR  Home/SNF/Other Nursing Home Patient oriented to: self Is this baseline? Yes   Triage Complete: Triage complete  Chief Complaint Fall  Triage Note Pt had unwitnessed fall at nursing home and c/o right hip pain.    Allergies Allergies  Allergen Reactions  . Sulfa Antibiotics     Not listed on MAR provided by Nursing facility    Level of Care/Admitting Diagnosis ED Disposition    ED Disposition Condition [place]: [facility] [id number]  Level of Care: Med-Surg [16]  Covid Evaluation: Asymptomatic Screening Protocol (No Symptoms)  Diagnosis: Pelvic fracture [facility]) [id number]  Admitting Physician: Loree Fee  Attending Physician: Oswald Hillock [[id number]  PT Class (Do Not Modify): Observation [104]  PT Acc Code (Do Not Modify): Observation [10022]       B Medical/Surgery History Past Medical History:  Diagnosis Date  . Anxiety   . Dementia ([place])   . Hypertension   . Overactive bladder   . Pneumonia   . Stroke [facility])    Past Surgical History:  Procedure Laterality Date  . HIP ADDUCTOR TENOTOMY    . JOINT REPLACEMENT       A IV Location/Drains/Wounds Patient Lines/Drains/Airways Status   Active Line/Drains/Airways    Name:   Placement date:   Placement time:   Site:   Days:   Peripheral IV 09/11/18 Right Forearm   09/11/18    2217    Forearm   1   Pressure Injury 05/29/18 Stage I -  Intact skin with non-blanchable redness of a localized area usually over a bony prominence.   05/29/18    1836     106          Intake/Output Last 24 hours No intake or output data in the 24 hours ending 09/12/18 0158  Labs/Imaging No results found for this or any previous visit (from the past 49 hour(s)). Ct Head Wo Contrast  Result Date:  09/11/2018 CLINICAL DATA:  Head trauma EXAM: CT HEAD WITHOUT CONTRAST CT CERVICAL SPINE WITHOUT CONTRAST TECHNIQUE: Multidetector CT imaging of the head and cervical spine was performed following the standard protocol without intravenous contrast. Multiplanar CT image reconstructions of the cervical spine were also generated. COMPARISON:  CT head May 29, 2018, CT head and cervical spine March 28, 2018 cyst FINDINGS: CT HEAD FINDINGS Brain: Stable regions of gliosis in the inferior left frontal lobe, right cerebellar hemisphere and bilateral occipital lobes. No evidence of acute infarction, hemorrhage, hydrocephalus, extra-axial collection or mass lesion/mass effect. Symmetric prominence of the ventricles, cisterns and sulci compatible with parenchymal volume loss. Patchy areas of white matter hypoattenuation are most compatible with chronic microvascular angiopathy. Vascular: Atherosclerotic calcification of the carotid siphons and intradural vertebral arteries. Skull: Right parietal scalp swelling and infiltration without subjacent calvarial fracture or other acute osseous abnormality. No suspicious osseous lesions. Hyperostosis frontalis interna, a benign incidental finding. Sinuses/Orbits: Minimal pansinus mural disease. No air-fluid levels. Likely senescent scleral plaques. Prior lens extractions. Orbital structures are otherwise unremarkable. Other: None. CT CERVICAL SPINE FINDINGS Alignment: Degenerative exaggeration of the normal cervical lordosis without traumatic listhesis. No abnormal facet widening. Normal alignment of the craniocervical and atlantoaxial articulations. Skull base and vertebrae: No acute fracture. No primary bone lesion or focal pathologic process. Soft tissues and spinal  canal: No pre or paravertebral fluid or swelling. No visible canal hematoma. Disc levels: Multilevel intervertebral disc height loss with spondylitic endplate changes. Multilevel posterior disc osteophyte complexes efface  the ventral thecal sac without significant spinal canal stenosis. Uncinate spurring and facet hypertrophic changes result in at most moderate right foraminal narrowing at C3-4 and C4-5. No other significant canal or foraminal stenosis. Upper chest: Heterogeneous multinodular thyroid. Largest nodule in the posterior left gland measures up to 2.2 cm. Atheromatous plaque within the cervical carotids and great vessels. Other: None. IMPRESSION: 1. No acute intracranial abnormality. 2. Right parietal scalp swelling and infiltration without subjacent calvarial fracture. 3. Areas of gliosis in the right cerebellum, bilateral occipital lobes and inferior left frontal lobe compatible remote infarct. Background of volume loss and chronic microvascular ischemic changes with intracranial atherosclerosis. 4. No acute cervical spine fracture. 5. Multilevel cervical spondylosis detailed above. Maximal changes C3-4 C4-5. 6. Multinodular thyroid gland. Largest nodule measuring 2.2 cm in left lobe. Could consider follow-up thyroid ultrasound following discussion with the patient on the clinical utility of this exam. This follows ACR consensus guidelines: Managing Incidental Thyroid Nodules Detected on Imaging: White Paper of the ACR Incidental Thyroid Findings Committee. J Am Coll Radiol 2015; 12:143-150. Electronically Signed   By: Lovena Le M.D.   On: 09/11/2018 20:36   Ct Cervical Spine Wo Contrast  Result Date: 09/11/2018 CLINICAL DATA:  Head trauma EXAM: CT HEAD WITHOUT CONTRAST CT CERVICAL SPINE WITHOUT CONTRAST TECHNIQUE: Multidetector CT imaging of the head and cervical spine was performed following the standard protocol without intravenous contrast. Multiplanar CT image reconstructions of the cervical spine were also generated. COMPARISON:  CT head May 29, 2018, CT head and cervical spine March 28, 2018 cyst FINDINGS: CT HEAD FINDINGS Brain: Stable regions of gliosis in the inferior left frontal lobe, right cerebellar  hemisphere and bilateral occipital lobes. No evidence of acute infarction, hemorrhage, hydrocephalus, extra-axial collection or mass lesion/mass effect. Symmetric prominence of the ventricles, cisterns and sulci compatible with parenchymal volume loss. Patchy areas of white matter hypoattenuation are most compatible with chronic microvascular angiopathy. Vascular: Atherosclerotic calcification of the carotid siphons and intradural vertebral arteries. Skull: Right parietal scalp swelling and infiltration without subjacent calvarial fracture or other acute osseous abnormality. No suspicious osseous lesions. Hyperostosis frontalis interna, a benign incidental finding. Sinuses/Orbits: Minimal pansinus mural disease. No air-fluid levels. Likely senescent scleral plaques. Prior lens extractions. Orbital structures are otherwise unremarkable. Other: None. CT CERVICAL SPINE FINDINGS Alignment: Degenerative exaggeration of the normal cervical lordosis without traumatic listhesis. No abnormal facet widening. Normal alignment of the craniocervical and atlantoaxial articulations. Skull base and vertebrae: No acute fracture. No primary bone lesion or focal pathologic process. Soft tissues and spinal canal: No pre or paravertebral fluid or swelling. No visible canal hematoma. Disc levels: Multilevel intervertebral disc height loss with spondylitic endplate changes. Multilevel posterior disc osteophyte complexes efface the ventral thecal sac without significant spinal canal stenosis. Uncinate spurring and facet hypertrophic changes result in at most moderate right foraminal narrowing at C3-4 and C4-5. No other significant canal or foraminal stenosis. Upper chest: Heterogeneous multinodular thyroid. Largest nodule in the posterior left gland measures up to 2.2 cm. Atheromatous plaque within the cervical carotids and great vessels. Other: None. IMPRESSION: 1. No acute intracranial abnormality. 2. Right parietal scalp swelling and  infiltration without subjacent calvarial fracture. 3. Areas of gliosis in the right cerebellum, bilateral occipital lobes and inferior left frontal lobe compatible remote infarct. Background of volume loss and chronic  microvascular ischemic changes with intracranial atherosclerosis. 4. No acute cervical spine fracture. 5. Multilevel cervical spondylosis detailed above. Maximal changes C3-4 C4-5. 6. Multinodular thyroid gland. Largest nodule measuring 2.2 cm in left lobe. Could consider follow-up thyroid ultrasound following discussion with the patient on the clinical utility of this exam. This follows ACR consensus guidelines: Managing Incidental Thyroid Nodules Detected on Imaging: White Paper of the ACR Incidental Thyroid Findings Committee. J Am Coll Radiol 2015; 12:143-150. Electronically Signed   By: Lovena Le M.D.   On: 09/11/2018 20:36   Dg Femur Min 2 Views Right  Result Date: 09/11/2018 CLINICAL DATA:  Right hip pain after fall. EXAM: RIGHT FEMUR 2 VIEWS COMPARISON:  Radiograph of March 28, 2018. FINDINGS: Status post right total hip arthroplasty with surgical internal fixation of the distal right femur. No definite evidence of fracture is seen involving the femur. Nondisplaced fracture is seen involving the right inferior pubic ramus. IMPRESSION: Postsurgical changes as described above. No acute abnormality seen involving the right femur. Nondisplaced right inferior pubic ramus fracture is noted. Electronically Signed   By: Marijo Conception M.D.   On: 09/11/2018 20:43   Dg Hips Bilat W Or Wo Pelvis 3-4 Views  Result Date: 09/11/2018 CLINICAL DATA:  Right hip pain after fall. EXAM: DG HIP (WITH OR WITHOUT PELVIS) 3-4V BILAT COMPARISON:  None. FINDINGS: Status post right total hip arthroplasty. Left hip appears normal. Nondisplaced fracture is seen involving right inferior pubic ramus. No other bony abnormality is noted. Calcified degenerating fibroids are noted in the pelvis. Diffuse osteopenia is  noted. IMPRESSION: Nondisplaced fracture is seen involving right inferior pubic ramus. Status post right total hip arthroplasty. Electronically Signed   By: Marijo Conception M.D.   On: 09/11/2018 20:46    Pending Labs Unresulted Labs (From admission, onward)    Start     Ordered   09/19/18 0500  Creatinine, serum  (enoxaparin (LOVENOX)    CrCl >/= 30 ml/min)  Weekly,   R    Comments: while on enoxaparin therapy    09/12/18 0134   09/12/18 0500  CBC  Tomorrow morning,   R     09/12/18 0134   09/12/18 0135  CBC  (enoxaparin (LOVENOX)    CrCl >/= 30 ml/min)  Once,   STAT    Comments: Baseline for enoxaparin therapy IF NOT ALREADY DRAWN.  Notify MD if PLT < 100 K.    09/12/18 0134   09/12/18 0135  Creatinine, serum  (enoxaparin (LOVENOX)    CrCl >/= 30 ml/min)  Once,   STAT    Comments: Baseline for enoxaparin therapy IF NOT ALREADY DRAWN.    09/12/18 0134   09/11/18 2325  SARS CORONAVIRUS 2 (TAT 6-12 HRS)  Once,   R     09/11/18 2325          Vitals/Pain Today's Vitals   09/11/18 1916 09/11/18 2030 09/12/18 0030  BP: (!) 168/72 (!) 150/62 (!) 132/99  Pulse: 81  73  Resp: 18    Temp: 98.6 F (37 C)    SpO2: 100%  100%    Isolation Precautions Airborne and Contact precautions  Medications Medications  amLODipine (NORVASC) tablet 10 mg (has no administration in time range)  benazepril (LOTENSIN) tablet 20 mg (has no administration in time range)  metoprolol tartrate (LOPRESSOR) tablet 25 mg (has no administration in time range)  LORazepam (ATIVAN) tablet 0.5 mg (has no administration in time range)  sertraline (ZOLOFT) tablet 50 mg (has  no administration in time range)  traZODone (DESYREL) tablet 50 mg (has no administration in time range)  bisacodyl (DULCOLAX) suppository 10 mg (has no administration in time range)  mirabegron ER (MYRBETRIQ) tablet 25 mg (has no administration in time range)  enoxaparin (LOVENOX) injection 40 mg (has no administration in time range)  0.9  %  sodium chloride infusion (has no administration in time range)  acetaminophen (TYLENOL) tablet 650 mg (has no administration in time range)    Or  acetaminophen (TYLENOL) suppository 650 mg (has no administration in time range)  ondansetron (ZOFRAN) tablet 4 mg (has no administration in time range)    Or  ondansetron (ZOFRAN) injection 4 mg (has no administration in time range)  oxyCODONE (Oxy IR/ROXICODONE) immediate release tablet 5 mg (has no administration in time range)  acetaminophen (TYLENOL) tablet 650 mg (650 mg Oral Given 09/11/18 2323)    Mobility walks with device High fall risk   Focused Assessments    R Recommendations: See Admitting Provider Note  Report given to:   Additional Notes: Pt has dementia and will slap and try to grab you.

## 2018-09-12 NOTE — Progress Notes (Signed)
Patient ID: [name], female   DOB: 11/23/1920, 85 y.o.   MRN: [id number]  Dr. [place] Crane asked me to look at [facility] for nondisplaced inferior pubic ramus fracture on the right she has a right total hip in place.  She has not completed her COVID-19 testing so I am not going into the room at this point  The x-ray shows that the fracture does not need surgery  Recommend weight-bear as tolerated  Physical therapy  X-ray in 6 weeks  DVT prophylaxis if patient has not been to be mobile over the next 30 days.

## 2018-09-12 NOTE — Plan of Care (Signed)
  Problem: Acute Rehab PT Goals(only PT should resolve) Goal: Pt will Roll Supine to Side Flowsheets (Taken 09/12/2018 1552) Pt will Roll Supine to Side:  with cues (comment type and amount)  with min assist Note: With verbal and tactile cues Goal: Pt Will Go Supine/Side To Sit Flowsheets (Taken 09/12/2018 1552) Pt will go Supine/Side to Sit:  with minimal assist  with cues (comment type and amount) Note: With verbal and tactile cues Goal: Patient Will Transfer Sit To/From Stand Flowsheets (Taken 09/12/2018 1552) Patient will transfer sit to/from stand:  with minimal assist  with cues (comment type and amount) Note: With verbal and tactile cues and RW Goal: Pt Will Transfer Bed To Chair/Chair To Bed Flowsheets (Taken 09/12/2018 1552) Pt will Transfer Bed to Chair/Chair to Bed:  with mod assist  with cues (comment type and amount) Note: With verbal and tactile cues and RW  3:53 PM, 09/12/18 Jerene Pitch, DPT Physical Therapy with [facility]  [phone] office

## 2018-09-12 NOTE — Progress Notes (Signed)
Patient more cooperative toward staff, however, she continues to refuse any PO medication and IV fluids. Drinking H20, juice and eating small amounts of meals. MD updated.  Elodia Florence RN 09/12/2018 @ [phone]

## 2018-09-13 DIAGNOSIS — Z20828 Contact with and (suspected) exposure to other viral communicable diseases: Secondary | ICD-10-CM | POA: Diagnosis present

## 2018-09-13 DIAGNOSIS — Z79891 Long term (current) use of opiate analgesic: Secondary | ICD-10-CM | POA: Diagnosis not present

## 2018-09-13 DIAGNOSIS — I10 Essential (primary) hypertension: Secondary | ICD-10-CM | POA: Diagnosis present

## 2018-09-13 DIAGNOSIS — S32591A Other specified fracture of right pubis, initial encounter for closed fracture: Principal | ICD-10-CM

## 2018-09-13 DIAGNOSIS — H547 Unspecified visual loss: Secondary | ICD-10-CM | POA: Diagnosis present

## 2018-09-13 DIAGNOSIS — F329 Major depressive disorder, single episode, unspecified: Secondary | ICD-10-CM | POA: Diagnosis present

## 2018-09-13 DIAGNOSIS — R451 Restlessness and agitation: Secondary | ICD-10-CM | POA: Diagnosis not present

## 2018-09-13 DIAGNOSIS — Z96641 Presence of right artificial hip joint: Secondary | ICD-10-CM | POA: Diagnosis present

## 2018-09-13 DIAGNOSIS — I739 Peripheral vascular disease, unspecified: Secondary | ICD-10-CM | POA: Diagnosis present

## 2018-09-13 DIAGNOSIS — Z881 Allergy status to other antibiotic agents status: Secondary | ICD-10-CM | POA: Diagnosis not present

## 2018-09-13 DIAGNOSIS — W19XXXA Unspecified fall, initial encounter: Secondary | ICD-10-CM | POA: Diagnosis present

## 2018-09-13 DIAGNOSIS — Z79890 Hormone replacement therapy (postmenopausal): Secondary | ICD-10-CM | POA: Diagnosis not present

## 2018-09-13 DIAGNOSIS — F419 Anxiety disorder, unspecified: Secondary | ICD-10-CM | POA: Diagnosis present

## 2018-09-13 DIAGNOSIS — F0390 Unspecified dementia without behavioral disturbance: Secondary | ICD-10-CM | POA: Diagnosis present

## 2018-09-13 DIAGNOSIS — M84454A Pathological fracture, pelvis, initial encounter for fracture: Secondary | ICD-10-CM | POA: Diagnosis not present

## 2018-09-13 DIAGNOSIS — Z79899 Other long term (current) drug therapy: Secondary | ICD-10-CM | POA: Diagnosis not present

## 2018-09-13 DIAGNOSIS — N3281 Overactive bladder: Secondary | ICD-10-CM | POA: Diagnosis present

## 2018-09-13 DIAGNOSIS — Z66 Do not resuscitate: Secondary | ICD-10-CM | POA: Diagnosis present

## 2018-09-13 DIAGNOSIS — H9190 Unspecified hearing loss, unspecified ear: Secondary | ICD-10-CM | POA: Diagnosis present

## 2018-09-13 DIAGNOSIS — J849 Interstitial pulmonary disease, unspecified: Secondary | ICD-10-CM | POA: Diagnosis present

## 2018-09-13 DIAGNOSIS — Z8673 Personal history of transient ischemic attack (TIA), and cerebral infarction without residual deficits: Secondary | ICD-10-CM | POA: Diagnosis not present

## 2018-09-13 DIAGNOSIS — M810 Age-related osteoporosis without current pathological fracture: Secondary | ICD-10-CM | POA: Diagnosis present

## 2018-09-13 MED ORDER — ENSURE ENLIVE PO LIQD
237.0000 mL | Freq: Two times a day (BID) | ORAL | Status: DC
  Administered 2018-09-13 – 2018-09-20 (×12): 237 mL via ORAL

## 2018-09-13 NOTE — Progress Notes (Signed)
Physical Therapy Treatment Patient Details Name: [name] MRN: [id number] DOB: January 30, 1920 Today's Date: 09/13/2018    History of Present Illness [name]  is a 85 y.o. female, with history of dementia, hearing loss, hypertension, CVA was brought to ED from assisted-living facility.  Patient had an unwitnessed fall at the facility.  She complained of increasing pain in the right hip and thigh but apparently has been ambulatory at the facility.  Patient has dementia and hearing impaired, so history is limited.    PT Comments    Patient cooperative with therapy after much encouragement, demonstrates slow labored movement with increased right groin pain during bed mobility, limited to a few unsteady slow side steps at bedside due to poor standing balance, increasing pain and c/o fatigue.  Patient tolerated sitting up in chair after therapy - nursing staff notified.  Patient will benefit from continued physical therapy in hospital and recommended venue below to increase strength, balance, endurance for safe ADLs and gait.   Follow Up Recommendations  SNF;Supervision/Assistance - 24 hour;Supervision for mobility/OOB     Equipment Recommendations  None recommended by PT    Recommendations for Other Services       Precautions / Restrictions Precautions Precautions: Fall Precaution Comments: right inferior pubic ramus fracture Restrictions Weight Bearing Restrictions: Yes RUE Weight Bearing: Weight bearing as tolerated    Mobility  Bed Mobility Overal bed mobility: Needs Assistance Bed Mobility: Supine to Sit;Rolling Rolling: Min assist   Supine to sit: Mod assist     General bed mobility comments: required constant verbal/tactile cueing  Transfers Overall transfer level: Needs assistance Equipment used: Rolling walker (2 wheeled) Transfers: Sit to/from [facility] Sit to Stand: Min assist;Mod assist Stand pivot transfers: Min assist;Mod assist       General transfer comment: slow labored movement, constant verbal/tactile cueing  Ambulation/Gait Ambulation/Gait assistance: Mod assist;Max assist Gait Distance (Feet): 6 Feet Assistive device: Rolling walker (2 wheeled) Gait Pattern/deviations: Decreased step length - right;Decreased step length - left;Decreased stride length;Decreased stance time - right;Antalgic Gait velocity: slow   General Gait Details: limited to 6-7 slow unsteady labored side steps at bedside, limited mostly due to c/o increased RLE pain and fatigue   Stairs             Wheelchair Mobility    Modified Rankin (Stroke Patients Only)       Balance Overall balance assessment: Needs assistance Sitting-balance support: Feet supported;Bilateral upper extremity supported Sitting balance-Leahy Scale: Fair Sitting balance - Comments: fair/good seated at bedside   Standing balance support: Bilateral upper extremity supported;During functional activity Standing balance-Leahy Scale: Poor Standing balance comment: fair/poor using RW                            Cognition Arousal/Alertness: Awake/alert Behavior During Therapy: Anxious;Agitated;WFL for tasks assessed/performed Overall Cognitive Status: History of cognitive impairments - at baseline                                 General Comments: cooperative with much encouragement      Exercises General Exercises - Lower Extremity Toe Raises: Seated;AROM;Strengthening;Both;5 reps Heel Raises: Seated;AROM;Strengthening;Both;5 reps    General Comments        Pertinent Vitals/Pain Pain Assessment: Faces Faces Pain Scale: Hurts even more Pain Location: right hip/groin area Pain Descriptors / Indicators: Grimacing;Sore;Guarding Pain Intervention(s): Limited activity within patient's tolerance;Monitored  during session    Home Living                      Prior Function            PT Goals (current goals can now  be found in the care plan section) Acute Rehab PT Goals Patient Stated Goal: not stated PT Goal Formulation: With patient Time For Goal Achievement: 09/25/18 Progress towards PT goals: Progressing toward goals    Frequency    Min 3X/week      PT Plan      Co-evaluation              AM-PAC PT "6 Clicks" Mobility   Outcome Measure  Help needed turning from your back to your side while in a flat bed without using bedrails?: A Lot Help needed moving from lying on your back to sitting on the side of a flat bed without using bedrails?: A Lot Help needed moving to and from a bed to a chair (including a wheelchair)?: A Lot Help needed standing up from a chair using your arms (e.g., wheelchair or bedside chair)?: A Lot Help needed to walk in hospital room?: A Lot Help needed climbing 3-5 steps with a railing? : Total 6 Click Score: 11    End of Session   Activity Tolerance: Patient tolerated treatment well;Patient limited by fatigue;Patient limited by pain Patient left: in chair;with call bell/phone within reach;with chair alarm set Nurse Communication: Mobility status PT Visit Diagnosis: Unsteadiness on feet (R26.81);Other abnormalities of gait and mobility (R26.89);Muscle weakness (generalized) (M62.81);Difficulty in walking, not elsewhere classified (R26.2)     Time: 0950-1020 PT Time Calculation (min) (ACUTE ONLY): 30 min  Charges:  $Therapeutic Activity: 23-37 mins                     3:03 PM, 09/13/18 Lonell Grandchild, MPT Physical Therapist with [facility] [phone] office [phone] mobile phone

## 2018-09-13 NOTE — [place] FL2 (Signed)
[place] MEDICAID FL2 LEVEL OF CARE SCREENING TOOL     IDENTIFICATION  Patient Name: [name] Birthdate: April 13, 1920 Sex: female Admission Date (Current Location): 09/11/2018  [facility] and [place] Number:  [employer] and Address:  [facility] [address] [place]      Provider Number: [id number]  Attending Physician Name and Address:  Sinda Du, MD  Relative Name and Phone Number:       Current Level of Care: Hospital Recommended Level of Care: [place] Prior Approval Number:    Date Approved/Denied:   PASRR Number: [id number]  Discharge Plan: SNF    Current Diagnoses: Patient Active Problem List   Diagnosis Date Noted  . Pelvic fracture ([place]) 09/12/2018  . Colitis 05/29/2018  . Pressure injury of skin 05/29/2018  . Interstitial pulmonary disease ([place]) 03/29/2018  . Volume overload 03/29/2018  . Hypoxia 03/28/2018  . Dementia ([place]) 03/28/2018  . Goals of care, counseling/discussion   . Encounter for hospice care discussion   . Palliative care by specialist   . Terminal care   . Agitation   . Dyspnea   . HCAP (healthcare-associated pneumonia) 02/03/2018  . AKI (acute kidney injury) ([place]) 02/03/2018  . Dehydration 02/03/2018  . Acute respiratory failure with hypoxia ([place]) 02/03/2018  . Pneumonia 12/17/2017  . Altered mental status 07/10/2016  . Urinary incontinence 07/10/2016  . History of CVA (cerebrovascular accident) - by CT scan 07/10/2016  . Compression fracture of L2 lumbar vertebra (HCC) 11/25/2015  . Compression fracture of spine, non-traumatic ([place]) 11/25/2015  . Osteoporosis 11/25/2015  . Peripheral arterial disease ([place]) 11/25/2015  . Pyelonephritis, acute 11/23/2015  . UTI (urinary tract infection) 11/23/2015  . Community acquired pneumonia 07/11/2014  . Urinary tract infection, site not specified 03/01/2012  . Acute delirium 03/01/2012  . Essential hypertension, benign  03/01/2012  . Hypokalemia 03/01/2012    Orientation RESPIRATION BLADDER Height & Weight     Self  Normal Incontinent Weight: 43.6 kg Height:  5' (152.4 cm)  BEHAVIORAL SYMPTOMS/MOOD NEUROLOGICAL BOWEL NUTRITION STATUS  (none) (none) Incontinent Diet  AMBULATORY STATUS COMMUNICATION OF NEEDS Skin   Extensive Assist Verbally (Head wound)                       Personal Care Assistance Level of Assistance  Bathing, Feeding, Dressing Bathing Assistance: Maximum assistance Feeding assistance: Limited assistance Dressing Assistance: Maximum assistance     Functional Limitations Info  Sight, Hearing, Speech Sight Info: Impaired Hearing Info: Impaired Speech Info: Adequate    SPECIAL CARE FACTORS FREQUENCY  PT (By licensed PT)     PT Frequency: 5x/w              Contractures      Additional Factors Info  Code Status, Allergies Code Status Info: DNR Allergies Info: Sulfa Antibiotics           Current Medications (09/13/2018):  This is the current hospital active medication list Current Facility-Administered Medications  Medication Dose Route Frequency Provider Last Rate Last Dose  . 0.9 %  sodium chloride infusion   Intravenous Continuous Darrick Meigs, Marge Duncans, MD      . acetaminophen (TYLENOL) tablet 650 mg  650 mg Oral Q6H PRN Oswald Hillock, MD       Or  . acetaminophen (TYLENOL) suppository 650 mg  650 mg Rectal Q6H PRN Oswald Hillock, MD      . amLODipine (NORVASC) tablet 10 mg  10 mg Oral Daily Oswald Hillock, MD   10 mg at 09/13/18 0831  . benazepril (LOTENSIN) tablet 20 mg  20 mg Oral Daily Oswald Hillock, MD   20 mg at 09/13/18 0830  . bisacodyl (DULCOLAX) suppository 10 mg  10 mg Rectal Daily PRN Oswald Hillock, MD      . enoxaparin (LOVENOX) injection 30 mg  30 mg Subcutaneous Q24H Oswald Hillock, MD   30 mg at 09/13/18 0145  . feeding supplement (ENSURE ENLIVE) (ENSURE ENLIVE) liquid 237 mL  237 mL Oral BID BM Sinda Du, MD      . LORazepam (ATIVAN)  injection 0.5 mg  0.5 mg Intravenous Q6H PRN Sinda Du, MD   0.5 mg at 09/13/18 0520  . LORazepam (ATIVAN) tablet 0.5 mg  0.5 mg Oral Q4H PRN Oswald Hillock, MD      . metoprolol tartrate (LOPRESSOR) tablet 25 mg  25 mg Oral BID Oswald Hillock, MD   25 mg at 09/13/18 0831  . mirabegron ER (MYRBETRIQ) tablet 25 mg  25 mg Oral q morning - 10a Lama, Marge Duncans, MD      . morphine 4 MG/ML injection 4 mg  4 mg Intravenous Q4H PRN Sinda Du, MD   4 mg at 09/12/18 1124  . ondansetron (ZOFRAN) tablet 4 mg  4 mg Oral Q6H PRN Oswald Hillock, MD       Or  . ondansetron [facility]) injection 4 mg  4 mg Intravenous Q6H PRN Oswald Hillock, MD   4 mg at 09/12/18 1627  . oxyCODONE (Oxy IR/ROXICODONE) immediate release tablet 5 mg  5 mg Oral Q6H PRN Oswald Hillock, MD      . sertraline (ZOLOFT) tablet 50 mg  50 mg Oral q morning - 10a Oswald Hillock, MD   50 mg at 09/13/18 0831  . traZODone (DESYREL) tablet 50 mg  50 mg Oral QHS Oswald Hillock, MD         Discharge Medications: Please see discharge summary for a list of discharge medications.  Relevant Imaging Results:  Relevant Lab Results:   Additional [place], LCSW

## 2018-09-13 NOTE — TOC Initial Note (Addendum)
Transition of Care [facility]) - Initial/Assessment Note    Patient Details  Name: [name] MRN: [id number] Date of Birth: August 07, 1920  Transition of Care [facility]) CM/SW Contact:    Trish Mage, LCSW Phone Number: 09/13/2018, 1:36 PM  Clinical Narrative:   Ms Leaver has been a resident of [facility] for 2 years.  Prior to that she was living independently, but required placement after a previous fall.  She has had multiple hospitalizations in the past 2 years, but has always returned to [facility] from here.  She is generally confused in the hospital, better when at [facility].  She has not been ambulating since her admission to the hospital in January of this year, so is requiring more care than previously.  When I went to see her today, she opened her eyes, but did not respond to my questions, and showed no recognition that I was attempting to communicate with her.  I called Tammy at [facility] and gave her PT report update, to which Tammy replied it might be best she go to SNF for rehab.  Called daughter, who is concerned about level of confusion, and would prefer slhe return to [place] as she feels being back in familiar surroundings will help reduce confusion.  Dr Luan Pulling feels she would be better served back at [facility] as well. I left a message for Tammy to call me back.      If patient is to be referred to SNF, daughter stated they have had good experiences with both [facility] and [facility], and would like her mother referred to those facilities.    2:15  Received call back from Tammy at [facility].  She is concerned about pain management as they do not have a nurse of Dr at [facility], and is also concerned that patient needs the amount of help that she does with transfers as she does not have the staffing to be able to do 2 person assists on an on-going basis.  Will refer to SNF.     2:30  Pt offered bed at [facility].  I will call for insurance authroization.   Expected Discharge Plan: Assisted  Living Barriers to Discharge: Other (comment)(Need clarification as to plan-SNF v return to ALF)   Patient Goals and CMS Choice        Expected Discharge Plan and Services Expected Discharge Plan: Assisted Living   Discharge Planning Services: CM Consult   Living arrangements for the past 2 months: [place] Expected Discharge Date: 09/13/18                                    Prior Living Arrangements/Services Living arrangements for the past 2 months: [place] Lives with:: Facility Resident Patient language and need for interpreter reviewed:: Yes Do you feel safe going back to the place where you live?: Yes      Need for Family Participation in Patient Care: Yes (Comment) Care giver support system in place?: Yes (comment) Current home services: DME Criminal Activity/Legal Involvement Pertinent to Current Situation/Hospitalization: No - Comment as needed  Activities of Daily Living Home Assistive Devices/Equipment: None ADL Screening (condition at time of admission) Patient's cognitive ability adequate to safely complete daily activities?: No Is the patient deaf or have difficulty hearing?: No Does the patient have difficulty seeing, even when wearing glasses/contacts?: No Does the patient have difficulty concentrating, remembering,  or making decisions?: Yes Patient able to express need for assistance with ADLs?: Yes Does the patient have difficulty dressing or bathing?: Yes Independently performs ADLs?: No Communication: Independent Dressing (OT): Needs assistance Grooming: Needs assistance Feeding: Independent Bathing: Needs assistance Toileting: Needs assistance In/Out Bed: Needs assistance Walks in Home: Needs assistance Does the patient have difficulty walking or climbing stairs?: No Weakness of Legs: None Weakness of Arms/Hands: None  Permission Sought/Granted Permission sought to share information with : Family  Supports          Permission granted to share info w Relationship: daughter     Emotional Assessment Appearance:: Appears stated age Attitude/Demeanor/Rapport: Unable to Assess Affect (typically observed): Unable to Assess   Alcohol / Substance Use: Not Applicable Psych Involvement: No (comment)  Admission diagnosis:  Pubic ramus fracture, right, closed, initial encounter [facility]) [S32.591A] Patient Active Problem List   Diagnosis Date Noted  . Pelvic fracture ([place]) 09/12/2018  . Colitis 05/29/2018  . Pressure injury of skin 05/29/2018  . Interstitial pulmonary disease ([place]) 03/29/2018  . Volume overload 03/29/2018  . Hypoxia 03/28/2018  . Dementia ([place]) 03/28/2018  . Goals of care, counseling/discussion   . Encounter for hospice care discussion   . Palliative care by specialist   . Terminal care   . Agitation   . Dyspnea   . HCAP (healthcare-associated pneumonia) 02/03/2018  . AKI (acute kidney injury) ([place]) 02/03/2018  . Dehydration 02/03/2018  . Acute respiratory failure with hypoxia ([place]) 02/03/2018  . Pneumonia 12/17/2017  . Altered mental status 07/10/2016  . Urinary incontinence 07/10/2016  . History of CVA (cerebrovascular accident) - by CT scan 07/10/2016  . Compression fracture of L2 lumbar vertebra (HCC) 11/25/2015  . Compression fracture of spine, non-traumatic ([place]) 11/25/2015  . Osteoporosis 11/25/2015  . Peripheral arterial disease ([place]) 11/25/2015  . Pyelonephritis, acute 11/23/2015  . UTI (urinary tract infection) 11/23/2015  . Community acquired pneumonia 07/11/2014  . Urinary tract infection, site not specified 03/01/2012  . Acute delirium 03/01/2012  . Essential hypertension, benign 03/01/2012  . Hypokalemia 03/01/2012   PCP:  Sinda Du, MD Pharmacy:   [facility] [address] [address] Phone: [phone] Fax: [phone]     Social Determinants of Health (SDOH)  Interventions    Readmission Risk Interventions Readmission Risk Prevention Plan 05/31/2018  Transportation Screening Complete  Palliative Care Screening Not Applicable  Medication Review (RN Care Manager) Complete  Some recent data might be hidden

## 2018-09-13 NOTE — Consult Note (Signed)
Patient ID: [name], female   DOB: September 08, 1920, 85 y.o.   MRN: [id number] Consult in hospital [facility] Requested by Dr. Luan Pulling Reason for consultation pelvic fracture right  I recommend nonoperative treatment weight-bear as tolerated x-ray in 6 weeks based on the history and physical below  Chief Complaint  Patient presents with  . Fall    HPI [name] is a 85 y.o. female.  Presented to the hospital after falling with a right inferior pubic ramus fracture she has pain over the right pelvis with injury date 09/11/2018 pain severe enough to keep her from walking quality difficult to assess due to dementia increased with range of motion and attempted weightbearing   Review of Systems (at least 2) ROS  1.  Could not assess review of systems based on dementia    Past Medical History:  Diagnosis Date  . Anxiety   . Dementia ([place])   . Hypertension   . Overactive bladder   . Pneumonia   . Stroke [facility])      Past Surgical History:  Procedure Laterality Date  . HIP ADDUCTOR TENOTOMY    . JOINT REPLACEMENT       Social History  Social History   Tobacco Use  . Smoking status: Never Smoker  . Smokeless tobacco: Never Used  Substance Use Topics  . Alcohol use: No  . Drug use: No    Allergies  Allergen Reactions  . Sulfa Antibiotics     Not listed on MAR provided by Nursing facility    Current Facility-Administered Medications  Medication Dose Route Frequency Provider Last Rate Last Dose  . 0.9 %  sodium chloride infusion   Intravenous Continuous Darrick Meigs, Marge Duncans, MD      . acetaminophen (TYLENOL) tablet 650 mg  650 mg Oral Q6H PRN Oswald Hillock, MD       Or  . acetaminophen (TYLENOL) suppository 650 mg  650 mg Rectal Q6H PRN Oswald Hillock, MD      . amLODipine (NORVASC) tablet 10 mg  10 mg Oral Daily Darrick Meigs, Gagan S, MD      . benazepril (LOTENSIN) tablet 20 mg  20 mg Oral Daily Oswald Hillock, MD      . bisacodyl (DULCOLAX) suppository 10 mg  10  mg Rectal Daily PRN Oswald Hillock, MD      . enoxaparin (LOVENOX) injection 30 mg  30 mg Subcutaneous Q24H Oswald Hillock, MD   30 mg at 09/13/18 0145  . LORazepam (ATIVAN) injection 0.5 mg  0.5 mg Intravenous Q6H PRN Sinda Du, MD   0.5 mg at 09/13/18 0520  . LORazepam (ATIVAN) tablet 0.5 mg  0.5 mg Oral Q4H PRN Oswald Hillock, MD      . metoprolol tartrate (LOPRESSOR) tablet 25 mg  25 mg Oral BID Oswald Hillock, MD      . mirabegron ER [facility]) tablet 25 mg  25 mg Oral q morning - 10a Darrick Meigs, Marge Duncans, MD      . morphine 4 MG/ML injection 4 mg  4 mg Intravenous Q4H PRN Sinda Du, MD   4 mg at 09/12/18 1124  . ondansetron (ZOFRAN) tablet 4 mg  4 mg Oral Q6H PRN Oswald Hillock, MD       Or  . ondansetron [facility]) injection 4 mg  4 mg Intravenous Q6H PRN Oswald Hillock, MD   4 mg at 09/12/18 1627  . oxyCODONE (Oxy IR/ROXICODONE) immediate release tablet 5 mg  5 mg Oral Q6H PRN Oswald Hillock, MD      . sertraline (ZOLOFT) tablet 50 mg  50 mg Oral q morning - 10a Lama, Marge Duncans, MD      . traZODone (DESYREL) tablet 50 mg  50 mg Oral QHS Oswald Hillock, MD          Physical Exam-Detailed Physical Exam  Blood pressure (!) 157/82, pulse 98, temperature 98.2 F (36.8 C), temperature source Oral, resp. rate 20, height 5' (1.524 m), weight 43.6 kg, SpO2 94 %. Body mass index is 18.77 kg/m.  Gen. appearance small thin frail Cardiovascular exam the pulses are 2+ with no peripheral edema  The ambulatory status is unable to ambulate secondary to pelvic fracture Extremity examined left lower Inspection nontender Range of motion assessment full range of motion as recorded Stability assessment stability test reveal no instability or laxity Muscle strength and muscle tone are normal with no atrophy or tremors Skin there are no scars rashes lesions or lacerations  Sensation to touch is normal The patient is not oriented to person place and time The patient's mood and affect show  agitation  Right lower nontender painful range of motion no instability muscle tone normal skin intact sensation intact  Mild tenderness to compression and palpation of the right pelvis  MEDICAL DECISION MAKING (minimum/low)  Data Reviewed  I have personally reviewed the imaging studies and the report and my interpretation is:  INFERIOR PUBIC RAMUS FRACTURE  RIGHT HEMI PELVIS   Assessment and Plan  NON DISPLACED PELVIC FRACTURE RIGHT INFERIOR PUBIC RAMUS   XR IN 6 WEEKS   [place] 09/13/2018, 8:08 AM   Carole Civil MD

## 2018-09-13 NOTE — Progress Notes (Signed)
Initial Nutrition Assessment  DOCUMENTATION CODES:   Not applicable  INTERVENTION:  -Ensure Enlive po BID, each supplement provides 350 kcal and 20 grams of protein -Magic cup TID with meals, each supplement provides 290 kcal and 9 grams of protein   NUTRITION DIAGNOSIS:   Inadequate oral intake related to lethargy/confusion, wound healing(dementia; right pubic ramus fx) as evidenced by meal completion < 50%, per patient/family report(per nursing report).   GOAL:   Patient will meet greater than or equal to 90% of their needs   MONITOR:   PO intake, Supplement acceptance, Labs, Weight trends  REASON FOR ASSESSMENT:   Low [place]    ASSESSMENT:  85 year old female presenting from nursing facility s/p unwitnessed fall. Past medical history significant of dementia, hearing loss, HTN, CVA, anxiety,  and colitis. Patient admitted with right pubic ramus fx; no surgical intervention recommended.  Unable to obtain nutrition history at this time; resting at time of visit Patient diet modified to DYS 1; no recorded intake at this time. Per chart review, RN reports pt continues to refuse any PO medication and IV fluids; drinking water, juice and eating small amounts of meals. RD to send Ensure BID and magic cup with meals to assist with calorie and protein needs  Current wt 43.6 kg (95.9 lb) Medications and labs reviewed  NUTRITION - FOCUSED PHYSICAL EXAM: Deferred; pt resting at time of visit   Diet Order:   Diet Order            DIET - DYS 1 Room service appropriate? Yes; Fluid consistency: Thin  Diet effective now              EDUCATION NEEDS:   No education needs have been identified at this time  Skin:  Skin Assessment: Reviewed RN Assessment(incision; closed; head, blister; rt heel)  Last BM:  PTA  Height:   Ht Readings from Last 1 Encounters:  09/12/18 5' (1.524 m)    Weight:   Wt Readings from Last 1 Encounters:  09/12/18 43.6 kg    Ideal Body  Weight:  45.5 kg  BMI:  Body mass index is 18.77 kg/m.  Estimated Nutritional Needs:   Kcal:  1200-1310  Protein:  60-65  Fluid:  >1.2L   Lajuan Lines, RD, LDN Office [phone] After Hours/Weekend Pager: [phone]

## 2018-09-13 NOTE — Progress Notes (Signed)
Subjective: She is still confused.  She was more combative yesterday but is better now.  Still very confused.  Objective: Vital signs in last 24 hours: Temp:  [98.2 F (36.8 C)] 98.2 F (36.8 C) (08/27 0540) Pulse Rate:  [75-111] 98 (08/27 0540) Resp:  [16-24] 20 (08/27 0540) BP: (130-179)/(50-85) 157/82 (08/27 0540) SpO2:  [93 %-97 %] 94 % (08/27 0540) Weight change:  Last BM Date: (Unknown)  Intake/Output from previous day: No intake/output data recorded.  PHYSICAL EXAM General appearance: alert and Confused combative earlier but not now Resp: clear to auscultation bilaterally Cardio: regular rate and rhythm, S1, S2 normal, no murmur, click, rub or gallop GI: soft, non-tender; bowel sounds normal; no masses,  no organomegaly Extremities: She has some tenderness in the right hip area  Lab Results:  Results for orders placed or performed during the hospital encounter of 09/11/18 (from the past 48 hour(s))  SARS CORONAVIRUS 2 (TAT 6-12 HRS)     Status: None   Collection Time: 09/11/18 11:25 PM  Result Value Ref Range   SARS Coronavirus 2 NEGATIVE NEGATIVE    Comment: (NOTE) SARS-CoV-2 target nucleic acids are NOT DETECTED. The SARS-CoV-2 RNA is generally detectable in upper and lower respiratory specimens during the acute phase of infection. Negative results do not preclude SARS-CoV-2 infection, do not rule out co-infections with other pathogens, and should not be used as the sole basis for treatment or other patient management decisions. Negative results must be combined with clinical observations, patient history, and epidemiological information. The expected result is Negative. Fact Sheet for Patients: SugarRoll.be Fact Sheet for Healthcare Providers: https://www.woods-mathews.com/ This test is not yet approved or cleared by the [place] FDA and  has been authorized for detection and/or diagnosis of SARS-CoV-2 by FDA under  an Emergency Use Authorization (EUA). This EUA will remain  in effect (meaning this test can be used) for the duration of the COVID-19 declaration under Section 56 4(b)(1) of the Act, 21 U.S.C. section 360bbb-3(b)(1), unless the authorization is terminated or revoked sooner. Performed at [facility], [place] [address] [place] [address]   CBC     Status: None   Collection Time: 09/12/18 12:53 PM  Result Value Ref Range   WBC 8.6 4.0 - 10.5 K/uL   RBC 4.19 3.87 - 5.11 MIL/uL   Hemoglobin 12.9 12.0 - 15.0 g/dL   HCT 38.8 36.0 - 46.0 %   MCV 92.6 80.0 - 100.0 fL   MCH 30.8 26.0 - 34.0 pg   MCHC 33.2 30.0 - 36.0 g/dL   RDW 13.3 11.5 - 15.5 %   Platelets 214 150 - 400 K/uL   nRBC 0.0 0.0 - 0.2 %    Comment: Performed at [facility], [address], [place], [place] [address]  Comprehensive metabolic panel     Status: Abnormal   Collection Time: 09/12/18 12:53 PM  Result Value Ref Range   Sodium 140 135 - 145 mmol/L   Potassium 3.8 3.5 - 5.1 mmol/L   Chloride 103 98 - 111 mmol/L   CO2 25 22 - 32 mmol/L   Glucose, Bld 113 (H) 70 - 99 mg/dL   BUN 18 8 - 23 mg/dL   Creatinine, Ser 0.57 0.44 - 1.00 mg/dL   Calcium 8.9 8.9 - 10.3 mg/dL   Total Protein 7.3 6.5 - 8.1 g/dL   Albumin 4.2 3.5 - 5.0 g/dL   AST 21 15 - 41 U/L   ALT 17 0 - 44  U/L   Alkaline Phosphatase 92 38 - 126 U/L   Total Bilirubin 1.5 (H) 0.3 - 1.2 mg/dL   GFR calc non Af Amer >60 >60 mL/min   GFR calc Af Amer >60 >60 mL/min   Anion gap 12 5 - 15    Comment: Performed at [facility], [address], [place], [place] [address]    ABGS No results for input(s): PHART, PO2ART, TCO2, HCO3 in the last 72 hours.  Invalid input(s): PCO2 CULTURES Recent Results (from the past 240 hour(s))  SARS CORONAVIRUS 2 (TAT 6-12 HRS)     Status: None   Collection Time: 09/11/18 11:25 PM  Result Value Ref Range Status   SARS Coronavirus 2 NEGATIVE NEGATIVE Final    Comment: (NOTE) SARS-CoV-2 target nucleic acids  are NOT DETECTED. The SARS-CoV-2 RNA is generally detectable in upper and lower respiratory specimens during the acute phase of infection. Negative results do not preclude SARS-CoV-2 infection, do not rule out co-infections with other pathogens, and should not be used as the sole basis for treatment or other patient management decisions. Negative results must be combined with clinical observations, patient history, and epidemiological information. The expected result is Negative. Fact Sheet for Patients: SugarRoll.be Fact Sheet for Healthcare Providers: https://www.woods-mathews.com/ This test is not yet approved or cleared by the [place] FDA and  has been authorized for detection and/or diagnosis of SARS-CoV-2 by FDA under an Emergency Use Authorization (EUA). This EUA will remain  in effect (meaning this test can be used) for the duration of the COVID-19 declaration under Section 56 4(b)(1) of the Act, 21 U.S.C. section 360bbb-3(b)(1), unless the authorization is terminated or revoked sooner. Performed at [place] [facility], [place] [address] [place], [place] [address]    Studies/Results: Ct Head Wo Contrast  Result Date: 09/11/2018 CLINICAL DATA:  Head trauma EXAM: CT HEAD WITHOUT CONTRAST CT CERVICAL SPINE WITHOUT CONTRAST TECHNIQUE: Multidetector CT imaging of the head and cervical spine was performed following the standard protocol without intravenous contrast. Multiplanar CT image reconstructions of the cervical spine were also generated. COMPARISON:  CT head May 29, 2018, CT head and cervical spine March 28, 2018 cyst FINDINGS: CT HEAD FINDINGS Brain: Stable regions of gliosis in the inferior left frontal lobe, right cerebellar hemisphere and bilateral occipital lobes. No evidence of acute infarction, hemorrhage, hydrocephalus, extra-axial collection or mass lesion/mass effect. Symmetric prominence of the ventricles, cisterns and sulci  compatible with parenchymal volume loss. Patchy areas of white matter hypoattenuation are most compatible with chronic microvascular angiopathy. Vascular: Atherosclerotic calcification of the carotid siphons and intradural vertebral arteries. Skull: Right parietal scalp swelling and infiltration without subjacent calvarial fracture or other acute osseous abnormality. No suspicious osseous lesions. Hyperostosis frontalis interna, a benign incidental finding. Sinuses/Orbits: Minimal pansinus mural disease. No air-fluid levels. Likely senescent scleral plaques. Prior lens extractions. Orbital structures are otherwise unremarkable. Other: None. CT CERVICAL SPINE FINDINGS Alignment: Degenerative exaggeration of the normal cervical lordosis without traumatic listhesis. No abnormal facet widening. Normal alignment of the craniocervical and atlantoaxial articulations. Skull base and vertebrae: No acute fracture. No primary bone lesion or focal pathologic process. Soft tissues and spinal canal: No pre or paravertebral fluid or swelling. No visible canal hematoma. Disc levels: Multilevel intervertebral disc height loss with spondylitic endplate changes. Multilevel posterior disc osteophyte complexes efface the ventral thecal sac without significant spinal canal stenosis. Uncinate spurring and facet hypertrophic changes result in at most moderate right foraminal narrowing at C3-4 and C4-5. No other significant  canal or foraminal stenosis. Upper chest: Heterogeneous multinodular thyroid. Largest nodule in the posterior left gland measures up to 2.2 cm. Atheromatous plaque within the cervical carotids and great vessels. Other: None. IMPRESSION: 1. No acute intracranial abnormality. 2. Right parietal scalp swelling and infiltration without subjacent calvarial fracture. 3. Areas of gliosis in the right cerebellum, bilateral occipital lobes and inferior left frontal lobe compatible remote infarct. Background of volume loss and  chronic microvascular ischemic changes with intracranial atherosclerosis. 4. No acute cervical spine fracture. 5. Multilevel cervical spondylosis detailed above. Maximal changes C3-4 C4-5. 6. Multinodular thyroid gland. Largest nodule measuring 2.2 cm in left lobe. Could consider follow-up thyroid ultrasound following discussion with the patient on the clinical utility of this exam. This follows ACR consensus guidelines: Managing Incidental Thyroid Nodules Detected on Imaging: White Paper of the ACR Incidental Thyroid Findings Committee. J Am Coll Radiol 2015; 12:143-150. Electronically Signed   By: Lovena Le M.D.   On: 09/11/2018 20:36   Ct Cervical Spine Wo Contrast  Result Date: 09/11/2018 CLINICAL DATA:  Head trauma EXAM: CT HEAD WITHOUT CONTRAST CT CERVICAL SPINE WITHOUT CONTRAST TECHNIQUE: Multidetector CT imaging of the head and cervical spine was performed following the standard protocol without intravenous contrast. Multiplanar CT image reconstructions of the cervical spine were also generated. COMPARISON:  CT head May 29, 2018, CT head and cervical spine March 28, 2018 cyst FINDINGS: CT HEAD FINDINGS Brain: Stable regions of gliosis in the inferior left frontal lobe, right cerebellar hemisphere and bilateral occipital lobes. No evidence of acute infarction, hemorrhage, hydrocephalus, extra-axial collection or mass lesion/mass effect. Symmetric prominence of the ventricles, cisterns and sulci compatible with parenchymal volume loss. Patchy areas of white matter hypoattenuation are most compatible with chronic microvascular angiopathy. Vascular: Atherosclerotic calcification of the carotid siphons and intradural vertebral arteries. Skull: Right parietal scalp swelling and infiltration without subjacent calvarial fracture or other acute osseous abnormality. No suspicious osseous lesions. Hyperostosis frontalis interna, a benign incidental finding. Sinuses/Orbits: Minimal pansinus mural disease. No  air-fluid levels. Likely senescent scleral plaques. Prior lens extractions. Orbital structures are otherwise unremarkable. Other: None. CT CERVICAL SPINE FINDINGS Alignment: Degenerative exaggeration of the normal cervical lordosis without traumatic listhesis. No abnormal facet widening. Normal alignment of the craniocervical and atlantoaxial articulations. Skull base and vertebrae: No acute fracture. No primary bone lesion or focal pathologic process. Soft tissues and spinal canal: No pre or paravertebral fluid or swelling. No visible canal hematoma. Disc levels: Multilevel intervertebral disc height loss with spondylitic endplate changes. Multilevel posterior disc osteophyte complexes efface the ventral thecal sac without significant spinal canal stenosis. Uncinate spurring and facet hypertrophic changes result in at most moderate right foraminal narrowing at C3-4 and C4-5. No other significant canal or foraminal stenosis. Upper chest: Heterogeneous multinodular thyroid. Largest nodule in the posterior left gland measures up to 2.2 cm. Atheromatous plaque within the cervical carotids and great vessels. Other: None. IMPRESSION: 1. No acute intracranial abnormality. 2. Right parietal scalp swelling and infiltration without subjacent calvarial fracture. 3. Areas of gliosis in the right cerebellum, bilateral occipital lobes and inferior left frontal lobe compatible remote infarct. Background of volume loss and chronic microvascular ischemic changes with intracranial atherosclerosis. 4. No acute cervical spine fracture. 5. Multilevel cervical spondylosis detailed above. Maximal changes C3-4 C4-5. 6. Multinodular thyroid gland. Largest nodule measuring 2.2 cm in left lobe. Could consider follow-up thyroid ultrasound following discussion with the patient on the clinical utility of this exam. This follows ACR consensus guidelines: Managing Incidental Thyroid Nodules  Detected on Imaging: White Paper of the ACR Incidental  Thyroid Findings Committee. J Am Coll Radiol 2015; 12:143-150. Electronically Signed   By: Lovena Le M.D.   On: 09/11/2018 20:36   Dg Femur Min 2 Views Right  Result Date: 09/11/2018 CLINICAL DATA:  Right hip pain after fall. EXAM: RIGHT FEMUR 2 VIEWS COMPARISON:  Radiograph of March 28, 2018. FINDINGS: Status post right total hip arthroplasty with surgical internal fixation of the distal right femur. No definite evidence of fracture is seen involving the femur. Nondisplaced fracture is seen involving the right inferior pubic ramus. IMPRESSION: Postsurgical changes as described above. No acute abnormality seen involving the right femur. Nondisplaced right inferior pubic ramus fracture is noted. Electronically Signed   By: Marijo Conception M.D.   On: 09/11/2018 20:43   Dg Hips Bilat W Or Wo Pelvis 3-4 Views  Result Date: 09/11/2018 CLINICAL DATA:  Right hip pain after fall. EXAM: DG HIP (WITH OR WITHOUT PELVIS) 3-4V BILAT COMPARISON:  None. FINDINGS: Status post right total hip arthroplasty. Left hip appears normal. Nondisplaced fracture is seen involving right inferior pubic ramus. No other bony abnormality is noted. Calcified degenerating fibroids are noted in the pelvis. Diffuse osteopenia is noted. IMPRESSION: Nondisplaced fracture is seen involving right inferior pubic ramus. Status post right total hip arthroplasty. Electronically Signed   By: Marijo Conception M.D.   On: 09/11/2018 20:46    Medications:  Prior to Admission:  Medications Prior to Admission  Medication Sig Dispense Refill Last Dose  . acetaminophen (TYLENOL) 500 MG tablet Take 1,000 mg by mouth at bedtime.    09/10/2018 at Unknown time  . amLODipine (NORVASC) 10 MG tablet Take 1 tablet (10 mg total) by mouth daily. 30 tablet 5 09/11/2018 at Unknown time  . benazepril (LOTENSIN) 20 MG tablet Take 1 tablet (20 mg total) by mouth daily. 30 tablet 5 09/11/2018 at Unknown time  . bisacodyl (DULCOLAX) 10 MG suppository Place 1  suppository (10 mg total) rectally daily as needed for moderate constipation. 12 suppository 0 unknown  . conjugated estrogens (PREMARIN) vaginal cream Place 1 Applicatorful vaginally at bedtime.   09/10/2018 at Unknown time  . LORazepam (ATIVAN) 0.5 MG tablet Take 1 tablet (0.5 mg total) by mouth every 4 (four) hours as needed for anxiety or sedation. 30 tablet 0 unknown  . metoprolol tartrate (LOPRESSOR) 25 MG tablet Take 1 tablet (25 mg total) by mouth 2 (two) times daily. 60 tablet 5 09/11/2018 at 800  . mirabegron ER (MYRBETRIQ) 25 MG TB24 tablet Take 25 mg by mouth every morning.    09/11/2018 at Unknown time  . sertraline (ZOLOFT) 50 MG tablet Take 50 mg by mouth every morning.    09/11/2018 at Unknown time  . traZODone (DESYREL) 50 MG tablet Take 50 mg by mouth at bedtime.   09/10/2018 at Unknown time   Scheduled: . amLODipine  10 mg Oral Daily  . benazepril  20 mg Oral Daily  . enoxaparin (LOVENOX) injection  30 mg Subcutaneous Q24H  . metoprolol tartrate  25 mg Oral BID  . mirabegron ER  25 mg Oral q morning - 10a  . sertraline  50 mg Oral q morning - 10a  . traZODone  50 mg Oral QHS   Continuous: . sodium chloride     [id number] **OR** acetaminophen, bisacodyl, LORazepam, LORazepam, morphine injection, ondansetron **OR** ondansetron (ZOFRAN) IV, oxyCODONE  Assesment: She has a fracture of the inferior ramus of the right pelvis.  This  is following a fall.  She has been seen by orthopedics Dr. Aline Brochure and nonoperative treatment recommended  At baseline she has dementia and she is worse in the hospital  She has had trouble with anxiety and depression and I cannot really assess that at this point  Her mental status is altered from baseline and I think it is related to her acute injury.  She has hypertension at baseline and that is pretty well controlled Active Problems:   Pelvic fracture ([place])    Plan: No change in treatments. I think her mental status will probably  improve when she gets back to her normal environment but I think she is too agitated and confused to transfer right now   LOS: 0 days   [name] 09/13/2018, 8:13 AM

## 2018-09-14 NOTE — Progress Notes (Signed)
Subjective: She is less confused this morning.  Communication is still difficult because she is so hard of hearing.  It has been recommended that she go to skilled care facility but apparently that has been denied by her insurance company.  Her assisted living facility is concerned about trying to take her back because I do not have staffing to do 2 person assist on a routine basis and that is what she is requiring.  She is cooperating with therapy despite her dementia  Objective: Vital signs in last 24 hours: Temp:  [97.6 F (36.4 C)-98.1 F (36.7 C)] 97.9 F (36.6 C) (08/28 0457) Pulse Rate:  [77-98] 88 (08/28 0457) Resp:  [17-18] 17 (08/28 0457) BP: (110-123)/(53-100) 110/56 (08/28 0457) SpO2:  [95 %-100 %] 97 % (08/28 0457) Weight change:  Last BM Date: (unkown)  Intake/Output from previous day: 08/27 0701 - 08/28 0700 In: 300 [P.O.:300] Out: 200 [Urine:200]  PHYSICAL EXAM General appearance: Alert confused hard of hearing Resp: clear to auscultation bilaterally Cardio: regular rate and rhythm, S1, S2 normal, no murmur, click, rub or gallop GI: soft, non-tender; bowel sounds normal; no masses,  no organomegaly Extremities: No definite changes in her leg  Lab Results:  Results for orders placed or performed during the hospital encounter of 09/11/18 (from the past 48 hour(s))  CBC     Status: None   Collection Time: 09/12/18 12:53 PM  Result Value Ref Range   WBC 8.6 4.0 - 10.5 K/uL   RBC 4.19 3.87 - 5.11 MIL/uL   Hemoglobin 12.9 12.0 - 15.0 g/dL   HCT 38.8 36.0 - 46.0 %   MCV 92.6 80.0 - 100.0 fL   MCH 30.8 26.0 - 34.0 pg   MCHC 33.2 30.0 - 36.0 g/dL   RDW 13.3 11.5 - 15.5 %   Platelets 214 150 - 400 K/uL   nRBC 0.0 0.0 - 0.2 %    Comment: Performed at [facility], [address] [place], [place] [address]  Comprehensive metabolic panel     Status: Abnormal   Collection Time: 09/12/18 12:53 PM  Result Value Ref Range   Sodium 140 135 - 145 mmol/L   Potassium 3.8  3.5 - 5.1 mmol/L   Chloride 103 98 - 111 mmol/L   CO2 25 22 - 32 mmol/L   Glucose, Bld 113 (H) 70 - 99 mg/dL   BUN 18 8 - 23 mg/dL   Creatinine, Ser 0.57 0.44 - 1.00 mg/dL   Calcium 8.9 8.9 - 10.3 mg/dL   Total Protein 7.3 6.5 - 8.1 g/dL   Albumin 4.2 3.5 - 5.0 g/dL   AST 21 15 - 41 U/L   ALT 17 0 - 44 U/L   Alkaline Phosphatase 92 38 - 126 U/L   Total Bilirubin 1.5 (H) 0.3 - 1.2 mg/dL   GFR calc non Af Amer >60 >60 mL/min   GFR calc Af Amer >60 >60 mL/min   Anion gap 12 5 - 15    Comment: Performed at [facility], [address], [place], [place] [address]    ABGS No results for input(s): PHART, PO2ART, TCO2, HCO3 in the last 72 hours.  Invalid input(s): PCO2 CULTURES Recent Results (from the past 240 hour(s))  SARS CORONAVIRUS 2 (TAT 6-12 HRS)     Status: None   Collection Time: 09/11/18 11:25 PM  Result Value Ref Range Status   SARS Coronavirus 2 NEGATIVE NEGATIVE Final    Comment: (NOTE) SARS-CoV-2 target nucleic acids are NOT DETECTED.  The SARS-CoV-2 RNA is generally detectable in upper and lower respiratory specimens during the acute phase of infection. Negative results do not preclude SARS-CoV-2 infection, do not rule out co-infections with other pathogens, and should not be used as the sole basis for treatment or other patient management decisions. Negative results must be combined with clinical observations, patient history, and epidemiological information. The expected result is Negative. Fact Sheet for Patients: SugarRoll.be Fact Sheet for Healthcare Providers: https://www.woods-mathews.com/ This test is not yet approved or cleared by the [place] FDA and  has been authorized for detection and/or diagnosis of SARS-CoV-2 by FDA under an Emergency Use Authorization (EUA). This EUA will remain  in effect (meaning this test can be used) for the duration of the COVID-19 declaration under Section 56 4(b)(1) of the Act,  21 U.S.C. section 360bbb-3(b)(1), unless the authorization is terminated or revoked sooner. Performed at [facility], [place] [address] [place], [place] [address]    Studies/Results: No results found.  Medications:  Prior to Admission:  Medications Prior to Admission  Medication Sig Dispense Refill Last Dose  . acetaminophen (TYLENOL) 500 MG tablet Take 1,000 mg by mouth at bedtime.    09/10/2018 at Unknown time  . amLODipine (NORVASC) 10 MG tablet Take 1 tablet (10 mg total) by mouth daily. 30 tablet 5 09/11/2018 at Unknown time  . benazepril (LOTENSIN) 20 MG tablet Take 1 tablet (20 mg total) by mouth daily. 30 tablet 5 09/11/2018 at Unknown time  . bisacodyl (DULCOLAX) 10 MG suppository Place 1 suppository (10 mg total) rectally daily as needed for moderate constipation. 12 suppository 0 unknown  . conjugated estrogens (PREMARIN) vaginal cream Place 1 Applicatorful vaginally at bedtime.   09/10/2018 at Unknown time  . LORazepam (ATIVAN) 0.5 MG tablet Take 1 tablet (0.5 mg total) by mouth every 4 (four) hours as needed for anxiety or sedation. 30 tablet 0 unknown  . metoprolol tartrate (LOPRESSOR) 25 MG tablet Take 1 tablet (25 mg total) by mouth 2 (two) times daily. 60 tablet 5 09/11/2018 at 800  . mirabegron ER (MYRBETRIQ) 25 MG TB24 tablet Take 25 mg by mouth every morning.    09/11/2018 at Unknown time  . sertraline (ZOLOFT) 50 MG tablet Take 50 mg by mouth every morning.    09/11/2018 at Unknown time  . traZODone (DESYREL) 50 MG tablet Take 50 mg by mouth at bedtime.   09/10/2018 at Unknown time   Scheduled: . amLODipine  10 mg Oral Daily  . benazepril  20 mg Oral Daily  . enoxaparin (LOVENOX) injection  30 mg Subcutaneous Q24H  . feeding supplement (ENSURE ENLIVE)  237 mL Oral BID BM  . metoprolol tartrate  25 mg Oral BID  . mirabegron ER  25 mg Oral q morning - 10a  . sertraline  50 mg Oral q morning - 10a  . traZODone  50 mg Oral QHS   Continuous: . sodium chloride      [id number] **OR** acetaminophen, bisacodyl, LORazepam, LORazepam, morphine injection, ondansetron **OR** ondansetron (ZOFRAN) IV, oxyCODONE  Assesment: She was admitted with a pelvic fracture.  She still has pain.  She is receiving physical therapy and has been recommended that she go to skilled care facility.  Her assisted living facility does not feel like they can manage her at this time.  She has dementia and she was confused and combative on admission which frequently happens when she comes to the hospital but she is much better.  She is back to  her baseline confusion and is cooperating with therapy  She has hypertension which is well controlled  She is very hard of hearing which complicates her situation Active Problems:   Essential hypertension, benign   Altered mental status   History of CVA (cerebrovascular accident) - by CT scan   Agitation   Dementia (H[facility])   Pelvic fracture ([place])    Plan: Continue treatments.  It appears that our best plan is going to be for her to go to skilled care facility for rehabilitation with plans to transfer back to her assisted living facility after she is better.    LOS: 1 day   Alonza Bogus 09/14/2018, 8:40 AM

## 2018-09-14 NOTE — Care Management Important Message (Signed)
Important Message  Patient Details  Name: [name] MRN: [id number] Date of Birth: [birth year]   Medicare Important Message Given:  Yes     Tommy Medal 09/14/2018, 4:07 PM

## 2018-09-14 NOTE — Progress Notes (Addendum)
Pt agitated this am. Physically pounding and  pulling on equipment. Able to given Po Ativan. C/O constipation. Removed impaction with relief. States "you are not doing anything for me." Emotional/physicall support given. Bed alarm and siderails up x 4 for safety. Floor mat present.

## 2018-09-14 NOTE — Progress Notes (Signed)
Pt resting quietly with eyes closed resp even and unlabored, RR 16.

## 2018-09-14 NOTE — TOC Progression Note (Signed)
Transition of Care [facility]) - Progression Note    Patient Details  Name: [name] MRN: [id number] Date of Birth: 1920/08/26  Transition of Care [facility]) CM/SW [place], [place] Phone Number: 09/14/2018, 2:13 PM  Clinical Narrative:  Insurance denied rehab.  Upheld on appeal by Dr Westley Hummer at [facility] continues to be reluctant to take patient back without getting rehab at [facility].  Spoke with daughter who is planning on visiting patient this weekend, calling Tammi to see if she might be able to add financial incentive for additional staffing in order for mother to return.  Tammi open to visiting patient on Monday to see progress, or lack thereof, first hand.    Expected Discharge Plan: Assisted Living Barriers to Discharge: Other (comment)(Need clarification as to plan-SNF v return to ALF)  Expected Discharge Plan and Services Expected Discharge Plan: Assisted Living   Discharge Planning Services: CM Consult   Living arrangements for the past 2 months: [place] Expected Discharge Date: 09/13/18                                     Social Determinants of Health (SDOH) Interventions    Readmission Risk Interventions Readmission Risk Prevention Plan 05/31/2018  Transportation Screening Complete  Palliative Care Screening Not Applicable  Medication Review (RN Care Manager) Complete  Some recent data might be hidden

## 2018-09-14 NOTE — Progress Notes (Signed)
PT Cancellation Note  Patient Details Name: [name] MRN: [id number] DOB: February 27, 1920   Cancelled Treatment:    Reason Eval/Treat Not Completed: Patient declined, no reason specified. Patient confused and did not want to move from bed. Refused care at this time.    Eliezer Champagne 09/14/2018, 3:46 PM

## 2018-09-15 NOTE — Progress Notes (Signed)
Subjective: She was admitted with a fall.  She had a pelvis fracture.  Objective: Vital signs in last 24 hours: Temp:  [97 F (36.1 C)-98.2 F (36.8 C)] 98.2 F (36.8 C) (08/29 0540) Pulse Rate:  [84-107] 86 (08/29 0540) Resp:  [16-18] 18 (08/29 0540) BP: (116-151)/(59-94) 116/59 (08/29 0540) SpO2:  [95 %-96 %] 96 % (08/29 0540) Weight change:  Last BM Date: 09/14/18  Intake/Output from previous day: 08/28 0701 - 08/29 0700 In: 180 [P.O.:180] Out: -   PHYSICAL EXAM General appearance: alert and Still confused Resp: clear to auscultation bilaterally Cardio: regular rate and rhythm, S1, S2 normal, no murmur, click, rub or gallop GI: soft, non-tender; bowel sounds normal; no masses,  no organomegaly Extremities: extremities normal, atraumatic, no cyanosis or edema  Lab Results:  No results found for this or any previous visit (from the past 48 hour(s)).  ABGS No results for input(s): PHART, PO2ART, TCO2, HCO3 in the last 72 hours.  Invalid input(s): PCO2 CULTURES Recent Results (from the past 240 hour(s))  SARS CORONAVIRUS 2 (TAT 6-12 HRS)     Status: None   Collection Time: 09/11/18 11:25 PM  Result Value Ref Range Status   SARS Coronavirus 2 NEGATIVE NEGATIVE Final    Comment: (NOTE) SARS-CoV-2 target nucleic acids are NOT DETECTED. The SARS-CoV-2 RNA is generally detectable in upper and lower respiratory specimens during the acute phase of infection. Negative results do not preclude SARS-CoV-2 infection, do not rule out co-infections with other pathogens, and should not be used as the sole basis for treatment or other patient management decisions. Negative results must be combined with clinical observations, patient history, and epidemiological information. The expected result is Negative. Fact Sheet for Patients: SugarRoll.be Fact Sheet for Healthcare Providers: https://www.woods-mathews.com/ This test is not yet  approved or cleared by the [place] FDA and  has been authorized for detection and/or diagnosis of SARS-CoV-2 by FDA under an Emergency Use Authorization (EUA). This EUA will remain  in effect (meaning this test can be used) for the duration of the COVID-19 declaration under Section 56 4(b)(1) of the Act, 21 U.S.C. section 360bbb-3(b)(1), unless the authorization is terminated or revoked sooner. Performed at [facility], [place] [address] [place], [place] [address]    Studies/Results: No results found.  Medications:  Prior to Admission:  Medications Prior to Admission  Medication Sig Dispense Refill Last Dose  . acetaminophen (TYLENOL) 500 MG tablet Take 1,000 mg by mouth at bedtime.    09/10/2018 at Unknown time  . amLODipine (NORVASC) 10 MG tablet Take 1 tablet (10 mg total) by mouth daily. 30 tablet 5 09/11/2018 at Unknown time  . benazepril (LOTENSIN) 20 MG tablet Take 1 tablet (20 mg total) by mouth daily. 30 tablet 5 09/11/2018 at Unknown time  . bisacodyl (DULCOLAX) 10 MG suppository Place 1 suppository (10 mg total) rectally daily as needed for moderate constipation. 12 suppository 0 unknown  . conjugated estrogens (PREMARIN) vaginal cream Place 1 Applicatorful vaginally at bedtime.   09/10/2018 at Unknown time  . LORazepam (ATIVAN) 0.5 MG tablet Take 1 tablet (0.5 mg total) by mouth every 4 (four) hours as needed for anxiety or sedation. 30 tablet 0 unknown  . metoprolol tartrate (LOPRESSOR) 25 MG tablet Take 1 tablet (25 mg total) by mouth 2 (two) times daily. 60 tablet 5 09/11/2018 at 800  . mirabegron ER (MYRBETRIQ) 25 MG TB24 tablet Take 25 mg by mouth every morning.    09/11/2018 at Unknown time  .  sertraline (ZOLOFT) 50 MG tablet Take 50 mg by mouth every morning.    09/11/2018 at Unknown time  . traZODone (DESYREL) 50 MG tablet Take 50 mg by mouth at bedtime.   09/10/2018 at Unknown time   Scheduled: . amLODipine  10 mg Oral Daily  . benazepril  20 mg Oral Daily  .  enoxaparin (LOVENOX) injection  30 mg Subcutaneous Q24H  . feeding supplement (ENSURE ENLIVE)  237 mL Oral BID BM  . metoprolol tartrate  25 mg Oral BID  . mirabegron ER  25 mg Oral q morning - 10a  . sertraline  50 mg Oral q morning - 10a  . traZODone  50 mg Oral QHS   Continuous: . sodium chloride     [id number] **OR** acetaminophen, bisacodyl, LORazepam, LORazepam, morphine injection, ondansetron **OR** ondansetron (ZOFRAN) IV, oxyCODONE  Assesment: She is admitted with a fall and pelvic fracture.  She has dementia and she has been confused  Where having difficulty with disposition.  She was not approved for skilled care facility by her insurance company and may not be able to go back to her assisted living facility Active Problems:   Essential hypertension, benign   Altered mental status   History of CVA (cerebrovascular accident) - by CT scan   Agitation   Dementia ([place] )   Pelvic fracture ([place])    Plan: Continue current treatment    LOS: 2 days   Alonza Bogus 09/15/2018, 9:07 AM

## 2018-09-16 ENCOUNTER — Other Ambulatory Visit: Payer: Self-pay

## 2018-09-16 NOTE — Progress Notes (Signed)
Subjective: She remains confused.  She is not been combative.  No other new problems noted.  She is here because of a fall and a fractured pelvis.  She came from assisted living facility and at baseline has severe hearing loss, severe vision loss and dementia  Objective: Vital signs in last 24 hours: Temp:  [98.1 F (36.7 C)-98.8 F (37.1 C)] 98.4 F (36.9 C) (08/30 0555) Pulse Rate:  [71-107] 71 (08/30 0555) Resp:  [16-17] 16 (08/30 0555) BP: (115-129)/(53-79) 115/53 (08/30 0555) SpO2:  [93 %-98 %] 93 % (08/30 0555) Weight change:  Last BM Date: 09/14/18  Intake/Output from previous day: 08/29 0701 - 08/30 0700 In: 120 [P.O.:120] Out: -   PHYSICAL EXAM General appearance: alert and Very hard of hearing Resp: clear to auscultation bilaterally Cardio: regular rate and rhythm, S1, S2 normal, no murmur, click, rub or gallop GI: soft, non-tender; bowel sounds normal; no masses,  no organomegaly Extremities: extremities normal, atraumatic, no cyanosis or edema  Lab Results:  No results found for this or any previous visit (from the past 48 hour(s)).  ABGS No results for input(s): PHART, PO2ART, TCO2, HCO3 in the last 72 hours.  Invalid input(s): PCO2 CULTURES Recent Results (from the past 240 hour(s))  SARS CORONAVIRUS 2 (TAT 6-12 HRS)     Status: None   Collection Time: 09/11/18 11:25 PM  Result Value Ref Range Status   SARS Coronavirus 2 NEGATIVE NEGATIVE Final    Comment: (NOTE) SARS-CoV-2 target nucleic acids are NOT DETECTED. The SARS-CoV-2 RNA is generally detectable in upper and lower respiratory specimens during the acute phase of infection. Negative results do not preclude SARS-CoV-2 infection, do not rule out co-infections with other pathogens, and should not be used as the sole basis for treatment or other patient management decisions. Negative results must be combined with clinical observations, patient history, and epidemiological information. The  expected result is Negative. Fact Sheet for Patients: SugarRoll.be Fact Sheet for Healthcare Providers: https://www.woods-mathews.com/ This test is not yet approved or cleared by the [place] FDA and  has been authorized for detection and/or diagnosis of SARS-CoV-2 by FDA under an Emergency Use Authorization (EUA). This EUA will remain  in effect (meaning this test can be used) for the duration of the COVID-19 declaration under Section 56 4(b)(1) of the Act, 21 U.S.C. section 360bbb-3(b)(1), unless the authorization is terminated or revoked sooner. Performed at [facility], [place] [address] [place], [place] [address]    Studies/Results: No results found.  Medications:  Prior to Admission:  Medications Prior to Admission  Medication Sig Dispense Refill Last Dose  . acetaminophen (TYLENOL) 500 MG tablet Take 1,000 mg by mouth at bedtime.    09/10/2018 at Unknown time  . amLODipine (NORVASC) 10 MG tablet Take 1 tablet (10 mg total) by mouth daily. 30 tablet 5 09/11/2018 at Unknown time  . benazepril (LOTENSIN) 20 MG tablet Take 1 tablet (20 mg total) by mouth daily. 30 tablet 5 09/11/2018 at Unknown time  . bisacodyl (DULCOLAX) 10 MG suppository Place 1 suppository (10 mg total) rectally daily as needed for moderate constipation. 12 suppository 0 unknown  . conjugated estrogens (PREMARIN) vaginal cream Place 1 Applicatorful vaginally at bedtime.   09/10/2018 at Unknown time  . LORazepam (ATIVAN) 0.5 MG tablet Take 1 tablet (0.5 mg total) by mouth every 4 (four) hours as needed for anxiety or sedation. 30 tablet 0 unknown  . metoprolol tartrate (LOPRESSOR) 25 MG tablet Take 1 tablet (25 mg  total) by mouth 2 (two) times daily. 60 tablet 5 09/11/2018 at 800  . mirabegron ER (MYRBETRIQ) 25 MG TB24 tablet Take 25 mg by mouth every morning.    09/11/2018 at Unknown time  . sertraline (ZOLOFT) 50 MG tablet Take 50 mg by mouth every morning.     09/11/2018 at Unknown time  . traZODone (DESYREL) 50 MG tablet Take 50 mg by mouth at bedtime.   09/10/2018 at Unknown time   Scheduled: . amLODipine  10 mg Oral Daily  . benazepril  20 mg Oral Daily  . enoxaparin (LOVENOX) injection  30 mg Subcutaneous Q24H  . feeding supplement (ENSURE ENLIVE)  237 mL Oral BID BM  . metoprolol tartrate  25 mg Oral BID  . mirabegron ER  25 mg Oral q morning - 10a  . sertraline  50 mg Oral q morning - 10a  . traZODone  50 mg Oral QHS   Continuous: . sodium chloride     [id number] **OR** acetaminophen, bisacodyl, LORazepam, LORazepam, morphine injection, ondansetron **OR** ondansetron (ZOFRAN) IV, oxyCODONE  Assesment: She was admitted with pelvic fracture.  She does not need surgery.  Her situation has been complicated by her dementia and episodes of being combative.  It was recommended that she go to skilled care facility but she has not been approved by her insurance company.  Her assisted living facility is not certain that they can manage her but we are trying to work with them to see if something can be worked out when she is ready to be discharged  She has dementia and that is complicated by the fact that she has very hard of hearing and poor vision so it is easy for her to get confused Active Problems:   Essential hypertension, benign   Altered mental status   History of CVA (cerebrovascular accident) - by CT scan   Agitation   Dementia ([place])   Pelvic fracture ([place])    Plan: Continue current treatments    LOS: 3 days   [name] 09/16/2018, 9:23 AM

## 2018-09-17 MED ORDER — OXYCODONE HCL 5 MG PO TABS: 5.0000 mg | ORAL_TABLET | Freq: Four times a day (QID) | ORAL | 0 refills | Status: AC | PRN

## 2018-09-17 NOTE — Progress Notes (Signed)
Physical Therapy Treatment Patient Details Name: [name] MRN: [id number] DOB: 10-21-1920 Today's Date: 09/17/2018    History of Present Illness [name]  is a 85 y.o. female, with history of dementia, hearing loss, hypertension, CVA was brought to ED from assisted-living facility.  Patient had an unwitnessed fall at the facility.  She complained of increasing pain in the right hip and thigh but apparently has been ambulatory at the facility.  Patient has dementia and hearing impaired, so history is limited.    PT Comments    Patient requires less assistance for bed mobility, sit to stands and transfers with less c/o pain in right hip/groin area when completing functional activities.  Patient demonstrates increased endurance/distance for gait training without loss of balance and minor c/o discomfort right hip when weightbearing.  Patient tolerated sitting up in chair after therapy - RN notified.  Patient will benefit from continued physical therapy in hospital and recommended venue below to increase strength, balance, endurance for safe ADLs and gait.    Follow Up Recommendations  SNF;Supervision/Assistance - 24 hour;Supervision for mobility/OOB     Equipment Recommendations  Rolling walker with 5" wheels    Recommendations for Other Services       Precautions / Restrictions Precautions Precautions: Fall Precaution Comments: right inferior pubic ramus fracture Restrictions Weight Bearing Restrictions: Yes RUE Weight Bearing: Weight bearing as tolerated RLE Weight Bearing: Weight bearing as tolerated    Mobility  Bed Mobility Overal bed mobility: Needs Assistance Bed Mobility: Rolling;Sidelying to Sit Rolling: Supervision Sidelying to sit: Supervision;Min guard       General bed mobility comments: requires less assistance for sitting up at bedside  Transfers Overall transfer level: Needs assistance Equipment used: Rolling walker (2 wheeled) Transfers: Sit  to/from [facility] Sit to Stand: Min assist Stand pivot transfers: Min assist       General transfer comment: increased tolerance for weightbearing on RLE  Ambulation/Gait Ambulation/Gait assistance: Min assist Gait Distance (Feet): 45 Feet Assistive device: Rolling walker (2 wheeled) Gait Pattern/deviations: Decreased step length - right;Decreased step length - left;Decreased stance time - right;Decreased stride length;Antalgic Gait velocity: decreased   General Gait Details: increased endurance/distance for ambulation with labored cadence, increased tolerance for weightbearing on RLE without loss of balance, limited for ambulation due to c/o fatigue   Stairs             Wheelchair Mobility    Modified Rankin (Stroke Patients Only)       Balance Overall balance assessment: Needs assistance Sitting-balance support: Feet supported;No upper extremity supported Sitting balance-Leahy Scale: Good     Standing balance support: Bilateral upper extremity supported;During functional activity Standing balance-Leahy Scale: Fair Standing balance comment: using RW                            Cognition Arousal/Alertness: Awake/alert Behavior During Therapy: Anxious;WFL for tasks assessed/performed Overall Cognitive Status: History of cognitive impairments - at baseline                                 General Comments: Patient cooperative, very [facility]      Exercises      General Comments        Pertinent Vitals/Pain Pain Assessment: Faces Faces Pain Scale: Hurts a little bit Pain Location: right hip/groin area Pain Descriptors / Indicators: Grimacing;Sore Pain Intervention(s): Limited activity within patient's  tolerance;Monitored during session    Home Living                      Prior Function            PT Goals (current goals can now be found in the care plan section) Acute Rehab PT Goals Patient Stated  Goal: not stated PT Goal Formulation: With patient Time For Goal Achievement: 09/25/18 Progress towards PT goals: Progressing toward goals    Frequency    Min 3X/week      PT Plan Current plan remains appropriate    Co-evaluation              AM-PAC PT "6 Clicks" Mobility   Outcome Measure  Help needed turning from your back to your side while in a flat bed without using bedrails?: None Help needed moving from lying on your back to sitting on the side of a flat bed without using bedrails?: A Little Help needed moving to and from a bed to a chair (including a wheelchair)?: A Little Help needed standing up from a chair using your arms (e.g., wheelchair or bedside chair)?: A Little Help needed to walk in hospital room?: A Little Help needed climbing 3-5 steps with a railing? : A Lot 6 Click Score: 18    End of Session   Activity Tolerance: Patient tolerated treatment well;Patient limited by fatigue Patient left: in chair;with call bell/phone within reach Nurse Communication: Mobility status PT Visit Diagnosis: Unsteadiness on feet (R26.81);Other abnormalities of gait and mobility (R26.89);Muscle weakness (generalized) (M62.81);Difficulty in walking, not elsewhere classified (R26.2)     Time: [id number] PT Time Calculation (min) (ACUTE ONLY): 21 min  Charges:  $Therapeutic Activity: 8-22 mins                     2:56 PM, 09/17/18 Lonell Grandchild, MPT Physical Therapist with [facility] [phone] office [phone] mobile phone

## 2018-09-17 NOTE — Care Management Important Message (Signed)
Important Message  Patient Details  Name: [name] MRN: [id number] Date of Birth: 10/04/1920   Medicare Important Message Given:  Yes     Tommy Medal 09/17/2018, 3:32 PM

## 2018-09-17 NOTE — Progress Notes (Signed)
Subjective: She is resting quietly.  No episodes of severe agitation for the last 36 hours or so.  No other new problems noted  Objective: Vital signs in last 24 hours: Temp:  [98 F (36.7 C)-98.7 F (37.1 C)] 98.5 F (36.9 C) (08/31 0639) Pulse Rate:  [61-84] 61 (08/31 0639) Resp:  [16-17] 16 (08/31 0639) BP: (110-138)/(55-82) 110/55 (08/31 0639) SpO2:  [93 %-99 %] 96 % (08/31 0639) Weight change:  Last BM Date: 09/14/18  Intake/Output from previous day: 08/30 0701 - 08/31 0700 In: 233 [P.O.:233] Out: 2 [Urine:2]  PHYSICAL EXAM General appearance: Awake, alert, very hard of hearing, confused Resp: clear to auscultation bilaterally Cardio: regular rate and rhythm, S1, S2 normal, no murmur, click, rub or gallop GI: soft, non-tender; bowel sounds normal; no masses,  no organomegaly Extremities: extremities normal, atraumatic, no cyanosis or edema  Lab Results:  No results found for this or any previous visit (from the past 48 hour(s)).  ABGS No results for input(s): PHART, PO2ART, TCO2, HCO3 in the last 72 hours.  Invalid input(s): PCO2 CULTURES Recent Results (from the past 240 hour(s))  SARS CORONAVIRUS 2 (TAT 6-12 HRS)     Status: None   Collection Time: 09/11/18 11:25 PM  Result Value Ref Range Status   SARS Coronavirus 2 NEGATIVE NEGATIVE Final    Comment: (NOTE) SARS-CoV-2 target nucleic acids are NOT DETECTED. The SARS-CoV-2 RNA is generally detectable in upper and lower respiratory specimens during the acute phase of infection. Negative results do not preclude SARS-CoV-2 infection, do not rule out co-infections with other pathogens, and should not be used as the sole basis for treatment or other patient management decisions. Negative results must be combined with clinical observations, patient history, and epidemiological information. The expected result is Negative. Fact Sheet for Patients: SugarRoll.be Fact Sheet for  Healthcare Providers: https://www.woods-mathews.com/ This test is not yet approved or cleared by the [place] FDA and  has been authorized for detection and/or diagnosis of SARS-CoV-2 by FDA under an Emergency Use Authorization (EUA). This EUA will remain  in effect (meaning this test can be used) for the duration of the COVID-19 declaration under Section 56 4(b)(1) of the Act, 21 U.S.C. section 360bbb-3(b)(1), unless the authorization is terminated or revoked sooner. Performed at [facility], [place] [address] [place], [place] [address]    Studies/Results: No results found.  Medications:  Prior to Admission:  Medications Prior to Admission  Medication Sig Dispense Refill Last Dose  . acetaminophen (TYLENOL) 500 MG tablet Take 1,000 mg by mouth at bedtime.    09/10/2018 at Unknown time  . amLODipine (NORVASC) 10 MG tablet Take 1 tablet (10 mg total) by mouth daily. 30 tablet 5 09/11/2018 at Unknown time  . benazepril (LOTENSIN) 20 MG tablet Take 1 tablet (20 mg total) by mouth daily. 30 tablet 5 09/11/2018 at Unknown time  . bisacodyl (DULCOLAX) 10 MG suppository Place 1 suppository (10 mg total) rectally daily as needed for moderate constipation. 12 suppository 0 unknown  . conjugated estrogens (PREMARIN) vaginal cream Place 1 Applicatorful vaginally at bedtime.   09/10/2018 at Unknown time  . LORazepam (ATIVAN) 0.5 MG tablet Take 1 tablet (0.5 mg total) by mouth every 4 (four) hours as needed for anxiety or sedation. 30 tablet 0 unknown  . metoprolol tartrate (LOPRESSOR) 25 MG tablet Take 1 tablet (25 mg total) by mouth 2 (two) times daily. 60 tablet 5 09/11/2018 at 800  . mirabegron ER (MYRBETRIQ) 25 MG TB24 tablet  Take 25 mg by mouth every morning.    09/11/2018 at Unknown time  . sertraline (ZOLOFT) 50 MG tablet Take 50 mg by mouth every morning.    09/11/2018 at Unknown time  . traZODone (DESYREL) 50 MG tablet Take 50 mg by mouth at bedtime.   09/10/2018 at Unknown  time   Scheduled: . amLODipine  10 mg Oral Daily  . benazepril  20 mg Oral Daily  . enoxaparin (LOVENOX) injection  30 mg Subcutaneous Q24H  . feeding supplement (ENSURE ENLIVE)  237 mL Oral BID BM  . metoprolol tartrate  25 mg Oral BID  . mirabegron ER  25 mg Oral q morning - 10a  . sertraline  50 mg Oral q morning - 10a  . traZODone  50 mg Oral QHS   Continuous: . sodium chloride     [id number] **OR** acetaminophen, bisacodyl, LORazepam, LORazepam, morphine injection, ondansetron **OR** ondansetron (ZOFRAN) IV, oxyCODONE  Assesment: She was admitted with a fall and pelvic fracture.  She seems to be doing fairly well with that.  He was recommended by physical therapy that she have skilled care facility placement but her insurance company will not authorize.  I did peer to peer discussion with the doctor from the insurance company but he still felt that she was not appropriate for rehab.  She has dementia and has had trouble with altered mental status being combative and confused but her combativeness has resolved.  She has hypertension that is stable   Active Problems:   Essential hypertension, benign   Altered mental status   History of CVA (cerebrovascular accident) - by CT scan   Agitation   Dementia ([facility])   Pelvic fracture ([facility])    Plan: Disposition is a problem.  She is not approved for rehab.  Her assisted living facility is not certain that they can manage her.  Her daughter has offered to private pay for more help.  I will see what case management can do to help.  I will go ahead and make arrangements in case we can come up with disposition today    LOS: 4 days   Alonza Bogus 09/17/2018, 8:30 AM

## 2018-09-18 NOTE — TOC Progression Note (Signed)
Transition of Care [facility]) - Progression Note    Patient Details  Name: [name] MRN: [id number] Date of Birth: 11/22/1920  Transition of Care [facility]) CM/SW [place], [place] Phone Number: 09/18/2018, 11:38 AM  Clinical Narrative:   Yesterday afternoon, Tammy from [facility] came to visit, and confirmed what she had been saying all along, that patient cannot return to [facility], but requires higher level of care despite ability to walk 45 feet yesterday and then sit in chair for a couple of hours. Sent info to [facility] and [facility] for long term MCD consideration.  Also sent info. To [facility] in [place] after talking to [place] in intake.  Her number is [phone]. Daughter lives in [place].  [facility], previously [facility], in [place] currently has no MCD beds.    Expected Discharge Plan: Assisted Living Barriers to Discharge: Other (comment)(Need clarification as to plan-SNF v return to ALF)  Expected Discharge Plan and Services Expected Discharge Plan: Assisted Living   Discharge Planning Services: CM Consult   Living arrangements for the past 2 months: [place] Expected Discharge Date: 09/13/18                                     Social Determinants of Health (SDOH) Interventions    Readmission Risk Interventions Readmission Risk Prevention Plan 05/31/2018  Transportation Screening Complete  Palliative Care Screening Not Applicable  Medication Review (RN Care Manager) Complete  Some recent data might be hidden

## 2018-09-18 NOTE — Progress Notes (Signed)
Subjective: She continues to do somewhat better.  She is more alert.  Communication is difficult because of her hearing.  Her assisted living facility has not agreed to take her back because I think that they cannot manage her situation.  Objective: Vital signs in last 24 hours: Temp:  [97.5 F (36.4 C)-98.2 F (36.8 C)] 97.7 F (36.5 C) (09/01 0522) Pulse Rate:  [62-83] 62 (09/01 0522) Resp:  [16-20] 16 (09/01 0522) BP: (114-153)/(60-75) 114/60 (09/01 0522) SpO2:  [97 %-99 %] 97 % (09/01 0522) Weight change:  Last BM Date: 09/14/18  Intake/Output from previous day: 08/31 0701 - 09/01 0700 In: 120 [P.O.:120] Out: -   PHYSICAL EXAM General appearance: alert and no distress Resp: clear to auscultation bilaterally Cardio: regular rate and rhythm, S1, S2 normal, no murmur, click, rub or gallop GI: soft, non-tender; bowel sounds normal; no masses,  no organomegaly Extremities: extremities normal, atraumatic, no cyanosis or edema  Lab Results:  No results found for this or any previous visit (from the past 48 hour(s)).  ABGS No results for input(s): PHART, PO2ART, TCO2, HCO3 in the last 72 hours.  Invalid input(s): PCO2 CULTURES Recent Results (from the past 240 hour(s))  SARS CORONAVIRUS 2 (TAT 6-12 HRS)     Status: None   Collection Time: 09/11/18 11:25 PM  Result Value Ref Range Status   SARS Coronavirus 2 NEGATIVE NEGATIVE Final    Comment: (NOTE) SARS-CoV-2 target nucleic acids are NOT DETECTED. The SARS-CoV-2 RNA is generally detectable in upper and lower respiratory specimens during the acute phase of infection. Negative results do not preclude SARS-CoV-2 infection, do not rule out co-infections with other pathogens, and should not be used as the sole basis for treatment or other patient management decisions. Negative results must be combined with clinical observations, patient history, and epidemiological information. The expected result is Negative. Fact Sheet  for Patients: SugarRoll.be Fact Sheet for Healthcare Providers: https://www.woods-mathews.com/ This test is not yet approved or cleared by the [place] FDA and  has been authorized for detection and/or diagnosis of SARS-CoV-2 by FDA under an Emergency Use Authorization (EUA). This EUA will remain  in effect (meaning this test can be used) for the duration of the COVID-19 declaration under Section 56 4(b)(1) of the Act, 21 U.S.C. section 360bbb-3(b)(1), unless the authorization is terminated or revoked sooner. Performed at [facility], [place] [address] [place], [place] [address]    Studies/Results: No results found.  Medications:  Prior to Admission:  Medications Prior to Admission  Medication Sig Dispense Refill Last Dose  . acetaminophen (TYLENOL) 500 MG tablet Take 1,000 mg by mouth at bedtime.    09/10/2018 at Unknown time  . amLODipine (NORVASC) 10 MG tablet Take 1 tablet (10 mg total) by mouth daily. 30 tablet 5 09/11/2018 at Unknown time  . benazepril (LOTENSIN) 20 MG tablet Take 1 tablet (20 mg total) by mouth daily. 30 tablet 5 09/11/2018 at Unknown time  . bisacodyl (DULCOLAX) 10 MG suppository Place 1 suppository (10 mg total) rectally daily as needed for moderate constipation. 12 suppository 0 unknown  . conjugated estrogens (PREMARIN) vaginal cream Place 1 Applicatorful vaginally at bedtime.   09/10/2018 at Unknown time  . LORazepam (ATIVAN) 0.5 MG tablet Take 1 tablet (0.5 mg total) by mouth every 4 (four) hours as needed for anxiety or sedation. 30 tablet 0 unknown  . metoprolol tartrate (LOPRESSOR) 25 MG tablet Take 1 tablet (25 mg total) by mouth 2 (two) times daily. [place]  tablet 5 09/11/2018 at 800  . mirabegron ER (MYRBETRIQ) 25 MG TB24 tablet Take 25 mg by mouth every morning.    09/11/2018 at Unknown time  . sertraline (ZOLOFT) 50 MG tablet Take 50 mg by mouth every morning.    09/11/2018 at Unknown time  . traZODone  (DESYREL) 50 MG tablet Take 50 mg by mouth at bedtime.   09/10/2018 at Unknown time   Scheduled: . amLODipine  10 mg Oral Daily  . benazepril  20 mg Oral Daily  . enoxaparin (LOVENOX) injection  30 mg Subcutaneous Q24H  . feeding supplement (ENSURE ENLIVE)  237 mL Oral BID BM  . metoprolol tartrate  25 mg Oral BID  . mirabegron ER  25 mg Oral q morning - 10a  . sertraline  50 mg Oral q morning - 10a  . traZODone  50 mg Oral QHS   Continuous: . sodium chloride     [id number] **OR** acetaminophen, bisacodyl, LORazepam, LORazepam, morphine injection, ondansetron **OR** ondansetron (ZOFRAN) IV, oxyCODONE  Assesment: She was admitted with a fall and a pelvic fracture.  She became very agitated and combative initially but she has calm down and is back at her normal baseline.  She is able to hold a conversation although it is difficult because of her hearing loss.  She does not need surgical management of her fracture.  She has hypertension which is stable  She has dementia and she is doing okay now.  She has very poor vision and very poor hearing. Active Problems:   Essential hypertension, benign   Altered mental status   History of CVA (cerebrovascular accident) - by CT scan   Agitation   Dementia ([place])   Pelvic fracture ([place])    Plan: Apparently now she will have to go to long-term skilled care facility.    LOS: 5 days   Alonza Bogus 09/18/2018, 8:21 AM

## 2018-09-19 LAB — CREATININE, SERUM
Creatinine, Ser: 0.8 mg/dL (ref 0.44–1.00)
GFR calc Af Amer: >60 mL/min (ref >60)
GFR calc non Af Amer: >60 mL/min (ref >60)

## 2018-09-19 NOTE — Progress Notes (Signed)
Physical Therapy Treatment Patient Details Name: [name] MRN: [id number] DOB: Aug 24, 1920 Today's Date: 09/19/2018    History of Present Illness [name]  is a 85 y.o. female, with history of dementia, hearing loss, hypertension, CVA was brought to ED from assisted-living facility.  Patient had an unwitnessed fall at the facility.  She complained of increasing pain in the right hip and thigh but apparently has been ambulatory at the facility.  Patient has dementia and hearing impaired, so history is limited.    PT Comments    Patient presents confused and required increased assistance to sit up at bedside because of that, otherwise once on feet had no c/o pain and demonstrated increased tolerance/distance for ambulation with good return for weightbearing on RLE.  Patient tolerated sitting up in chair after therapy - RN/NT notified.  Patient will benefit from continued physical therapy in hospital and recommended venue below to increase strength, balance, endurance for safe ADLs and gait.    Follow Up Recommendations  SNF;Supervision/Assistance - 24 hour;Supervision for mobility/OOB     Equipment Recommendations  Rolling walker with 5" wheels    Recommendations for Other Services       Precautions / Restrictions Precautions Precautions: Fall Precaution Comments: right inferior pubic ramus fracture Restrictions RLE Weight Bearing: Weight bearing as tolerated    Mobility  Bed Mobility Overal bed mobility: Needs Assistance Bed Mobility: Supine to Sit;Sidelying to Sit Rolling: Min assist;Mod assist       Sit to sidelying: Mod assist General bed mobility comments: required more assistance possibly due to confusion when attemting to follow directions  Transfers Overall transfer level: Needs assistance Equipment used: Rolling walker (2 wheeled) Transfers: Sit to/from [facility] Sit to Stand: Min guard Stand pivot transfers: Min guard       General  transfer comment: increased BLE strength for sit to stands and transfers  Ambulation/Gait Ambulation/Gait assistance: Min guard;Min assist Gait Distance (Feet): 55 Feet Assistive device: Rolling walker (2 wheeled) Gait Pattern/deviations: Decreased step length - right;Decreased step length - left;Decreased stride length Gait velocity: decreased   General Gait Details: increased endurance/distance for ambulation without c/o pain RLE, required mod/max verbal/tactile cueing to follow instructions, required verbal/tactile to use RW and not attempt to use siderail in hallway   Stairs             Wheelchair Mobility    Modified Rankin (Stroke Patients Only)       Balance Overall balance assessment: Needs assistance Sitting-balance support: Feet supported;No upper extremity supported Sitting balance-Leahy Scale: Fair Sitting balance - Comments: fair/good seated at bedside   Standing balance support: Bilateral upper extremity supported;During functional activity Standing balance-Leahy Scale: Fair Standing balance comment: using RW                            Cognition Arousal/Alertness: Awake/alert Behavior During Therapy: Flat affect Overall Cognitive Status: History of cognitive impairments - at baseline                                 General Comments: Patient cooperative, very [facility]      Exercises      General Comments        Pertinent Vitals/Pain Pain Assessment: No/denies pain    Home Living  Prior Function            PT Goals (current goals can now be found in the care plan section) Acute Rehab PT Goals Patient Stated Goal: not stated Time For Goal Achievement: 09/25/18 Progress towards PT goals: Progressing toward goals    Frequency    Min 3X/week      PT Plan Current plan remains appropriate    Co-evaluation              AM-PAC PT "6 Clicks" Mobility   Outcome Measure  Help  needed turning from your back to your side while in a flat bed without using bedrails?: A Lot Help needed moving from lying on your back to sitting on the side of a flat bed without using bedrails?: A Lot Help needed moving to and from a bed to a chair (including a wheelchair)?: A Little Help needed standing up from a chair using your arms (e.g., wheelchair or bedside chair)?: A Little Help needed to walk in hospital room?: A Little Help needed climbing 3-5 steps with a railing? : A Lot 6 Click Score: 15    End of Session   Activity Tolerance: Patient tolerated treatment well;Patient limited by fatigue Patient left: in chair;with call bell/phone within reach;with chair alarm set Nurse Communication: Mobility status PT Visit Diagnosis: Unsteadiness on feet (R26.81);Other abnormalities of gait and mobility (R26.89);Muscle weakness (generalized) (M62.81);Difficulty in walking, not elsewhere classified (R26.2)     Time: [id number] PT Time Calculation (min) (ACUTE ONLY): 25 min  Charges:  $Gait Training: 8-22 mins $Therapeutic Activity: 8-22 mins                     3:01 PM, 09/19/18 Lonell Grandchild, MPT Physical Therapist with [facility] [phone] office [phone] mobile phone

## 2018-09-19 NOTE — Progress Notes (Signed)
No open incision or wound to head noted for measurement.  Right posterior scalp has bruise 8 by 3 cm.

## 2018-09-19 NOTE — Clinical Social Work Note (Signed)
[facility] indicated that patient it would cost [phone] to transport to [place]. [facility] stated that it would cost $250 to transport the 91.8 miles to facility. Fees were discussed with daughter, Tally Joe. Ms. Mercy Moore stated that she wanted to utilized [facility]. Advised to contact them regarding payment and confirming payment and that [facility] had to be notified by lunch on 09/20/2018. Ms. Mercy Moore stated that she would contact them to schedule and pay for Friday 09/21/2018 transport.      [place], Clydene Pugh, LCSW

## 2018-09-19 NOTE — Care Management Important Message (Signed)
Important Message  Patient Details  Name: [name] MRN: [id number] Date of Birth: [birth year]   Medicare Important Message Given:  Yes     Tommy Medal 09/19/2018, 1:50 PM

## 2018-09-19 NOTE — Progress Notes (Signed)
Nutrition Follow-up  DOCUMENTATION CODES:   Not applicable  INTERVENTION:  -Continue Ensure Enlive po BID, each supplement provides 350 kcal and 20 grams of protein -Continue Magic cup TID with meals, each supplement provides 290 kcal and 9 grams of protein -Recommend feeding assistance with meals to encourage oral intake -Daily weights  NUTRITION DIAGNOSIS:   Inadequate oral intake related to lethargy/confusion, wound healing(dementia; right pubic ramus fx) as evidenced by meal completion < 50%, per patient/family report(per nursing report).  Ongoing  GOAL:   Patient will meet greater than or equal to 90% of their needs  Unmet; progressing  MONITOR:   PO intake, Supplement acceptance, Labs, Weight trends  REASON FOR ASSESSMENT:   Low [place]    ASSESSMENT:   85 year old female presenting from nursing facility s/p unwitnessed fall. Past medical history significant of dementia, hearing loss, HTN, CVA, anxiety,  and colitis. Patient admitted with right pubic ramus fx; no surgical intervention recommended.  Patient anticipated d/c 9/4 to [facility] location to be closer to daughter who lives in [place] per social work note.   Patient reported with ongoing confusion and poor po (0-50% of meals documented 8/31-9/2) Patient oral intake may benefit from encouragement at meal times; recommend feeding assistance  No new weight since admission 8/26 43.6 kg  Medication and labs reviewed   Diet Order:   Diet Order            DIET - DYS 1 Room service appropriate? Yes; Fluid consistency: Thin  Diet effective now              EDUCATION NEEDS:   No education needs have been identified at this time  Skin:  Skin Assessment: Reviewed RN Assessment  Last BM:  8/31  Height:   Ht Readings from Last 1 Encounters:  09/12/18 5' (1.524 m)    Weight:   Wt Readings from Last 1 Encounters:  09/12/18 43.6 kg    Ideal Body Weight:  45.5 kg  BMI:  Body mass  index is 18.77 kg/m.  Estimated Nutritional Needs:   Kcal:  1200-1310  Protein:  60-65  Fluid:  >1.2L   Lajuan Lines, RD, LDN [facility] Telephone [phone] After Hours/Weekend Pager: [phone]

## 2018-09-19 NOTE — Discharge Summary (Signed)
Physician Discharge Summary  Patient ID: [name] MRN: [id number] DOB/AGE: Oct 13, 1920 85 y.o. Primary Care Physician:Janessa Mickle, Percell Miller, MD Admit date: 09/11/2018 Discharge date: 09/21/2018    Discharge Diagnoses:   Active Problems:   Essential hypertension, benign   Altered mental status   History of CVA (cerebrovascular accident) - by CT scan   Agitation   Dementia (HCC)   Pelvic fracture (HCC)   Allergies as of 09/19/2018      Reactions   Sulfa Antibiotics    Not listed on MAR provided by Nursing facility      Medication List    TAKE these medications   acetaminophen 500 MG tablet Commonly known as: TYLENOL Take 1,000 mg by mouth at bedtime.   amLODipine 10 MG tablet Commonly known as: NORVASC Take 1 tablet (10 mg total) by mouth daily.   benazepril 20 MG tablet Commonly known as: LOTENSIN Take 1 tablet (20 mg total) by mouth daily.   bisacodyl 10 MG suppository Commonly known as: DULCOLAX Place 1 suppository (10 mg total) rectally daily as needed for moderate constipation.   conjugated estrogens vaginal cream Commonly known as: PREMARIN Place 1 Applicatorful vaginally at bedtime.   LORazepam 0.5 MG tablet Commonly known as: ATIVAN Take 1 tablet (0.5 mg total) by mouth every 4 (four) hours as needed for anxiety or sedation.   metoprolol tartrate 25 MG tablet Commonly known as: LOPRESSOR Take 1 tablet (25 mg total) by mouth 2 (two) times daily.   Myrbetriq 25 MG Tb24 tablet Generic drug: mirabegron ER Take 25 mg by mouth every morning.   oxyCODONE 5 MG immediate release tablet Commonly known as: Oxy IR/ROXICODONE Take 1 tablet (5 mg total) by mouth every 6 (six) hours as needed for severe pain.   sertraline 50 MG tablet Commonly known as: ZOLOFT Take 50 mg by mouth every morning.   traZODone 50 MG tablet Commonly known as: DESYREL Take 50 mg by mouth at bedtime.       Discharged Condition: Improved    Consults: Orthopedics, Dr.  Aline Brochure  Significant Diagnostic Studies: Ct Head Wo Contrast  Result Date: 09/11/2018 CLINICAL DATA:  Head trauma EXAM: CT HEAD WITHOUT CONTRAST CT CERVICAL SPINE WITHOUT CONTRAST TECHNIQUE: Multidetector CT imaging of the head and cervical spine was performed following the standard protocol without intravenous contrast. Multiplanar CT image reconstructions of the cervical spine were also generated. COMPARISON:  CT head May 29, 2018, CT head and cervical spine March 28, 2018 cyst FINDINGS: CT HEAD FINDINGS Brain: Stable regions of gliosis in the inferior left frontal lobe, right cerebellar hemisphere and bilateral occipital lobes. No evidence of acute infarction, hemorrhage, hydrocephalus, extra-axial collection or mass lesion/mass effect. Symmetric prominence of the ventricles, cisterns and sulci compatible with parenchymal volume loss. Patchy areas of white matter hypoattenuation are most compatible with chronic microvascular angiopathy. Vascular: Atherosclerotic calcification of the carotid siphons and intradural vertebral arteries. Skull: Right parietal scalp swelling and infiltration without subjacent calvarial fracture or other acute osseous abnormality. No suspicious osseous lesions. Hyperostosis frontalis interna, a benign incidental finding. Sinuses/Orbits: Minimal pansinus mural disease. No air-fluid levels. Likely senescent scleral plaques. Prior lens extractions. Orbital structures are otherwise unremarkable. Other: None. CT CERVICAL SPINE FINDINGS Alignment: Degenerative exaggeration of the normal cervical lordosis without traumatic listhesis. No abnormal facet widening. Normal alignment of the craniocervical and atlantoaxial articulations. Skull base and vertebrae: No acute fracture. No primary bone lesion or focal pathologic process. Soft tissues and spinal canal: No pre or paravertebral fluid or swelling.  No visible canal hematoma. Disc levels: Multilevel intervertebral disc height loss with  spondylitic endplate changes. Multilevel posterior disc osteophyte complexes efface the ventral thecal sac without significant spinal canal stenosis. Uncinate spurring and facet hypertrophic changes result in at most moderate right foraminal narrowing at C3-4 and C4-5. No other significant canal or foraminal stenosis. Upper chest: Heterogeneous multinodular thyroid. Largest nodule in the posterior left gland measures up to 2.2 cm. Atheromatous plaque within the cervical carotids and great vessels. Other: None. IMPRESSION: 1. No acute intracranial abnormality. 2. Right parietal scalp swelling and infiltration without subjacent calvarial fracture. 3. Areas of gliosis in the right cerebellum, bilateral occipital lobes and inferior left frontal lobe compatible remote infarct. Background of volume loss and chronic microvascular ischemic changes with intracranial atherosclerosis. 4. No acute cervical spine fracture. 5. Multilevel cervical spondylosis detailed above. Maximal changes C3-4 C4-5. 6. Multinodular thyroid gland. Largest nodule measuring 2.2 cm in left lobe. Could consider follow-up thyroid ultrasound following discussion with the patient on the clinical utility of this exam. This follows ACR consensus guidelines: Managing Incidental Thyroid Nodules Detected on Imaging: White Paper of the ACR Incidental Thyroid Findings Committee. J Am Coll Radiol 2015; 12:143-150. Electronically Signed   By: Lovena Le M.D.   On: 09/11/2018 20:36   Ct Cervical Spine Wo Contrast  Result Date: 09/11/2018 CLINICAL DATA:  Head trauma EXAM: CT HEAD WITHOUT CONTRAST CT CERVICAL SPINE WITHOUT CONTRAST TECHNIQUE: Multidetector CT imaging of the head and cervical spine was performed following the standard protocol without intravenous contrast. Multiplanar CT image reconstructions of the cervical spine were also generated. COMPARISON:  CT head May 29, 2018, CT head and cervical spine March 28, 2018 cyst FINDINGS: CT HEAD FINDINGS  Brain: Stable regions of gliosis in the inferior left frontal lobe, right cerebellar hemisphere and bilateral occipital lobes. No evidence of acute infarction, hemorrhage, hydrocephalus, extra-axial collection or mass lesion/mass effect. Symmetric prominence of the ventricles, cisterns and sulci compatible with parenchymal volume loss. Patchy areas of white matter hypoattenuation are most compatible with chronic microvascular angiopathy. Vascular: Atherosclerotic calcification of the carotid siphons and intradural vertebral arteries. Skull: Right parietal scalp swelling and infiltration without subjacent calvarial fracture or other acute osseous abnormality. No suspicious osseous lesions. Hyperostosis frontalis interna, a benign incidental finding. Sinuses/Orbits: Minimal pansinus mural disease. No air-fluid levels. Likely senescent scleral plaques. Prior lens extractions. Orbital structures are otherwise unremarkable. Other: None. CT CERVICAL SPINE FINDINGS Alignment: Degenerative exaggeration of the normal cervical lordosis without traumatic listhesis. No abnormal facet widening. Normal alignment of the craniocervical and atlantoaxial articulations. Skull base and vertebrae: No acute fracture. No primary bone lesion or focal pathologic process. Soft tissues and spinal canal: No pre or paravertebral fluid or swelling. No visible canal hematoma. Disc levels: Multilevel intervertebral disc height loss with spondylitic endplate changes. Multilevel posterior disc osteophyte complexes efface the ventral thecal sac without significant spinal canal stenosis. Uncinate spurring and facet hypertrophic changes result in at most moderate right foraminal narrowing at C3-4 and C4-5. No other significant canal or foraminal stenosis. Upper chest: Heterogeneous multinodular thyroid. Largest nodule in the posterior left gland measures up to 2.2 cm. Atheromatous plaque within the cervical carotids and great vessels. Other: None.  IMPRESSION: 1. No acute intracranial abnormality. 2. Right parietal scalp swelling and infiltration without subjacent calvarial fracture. 3. Areas of gliosis in the right cerebellum, bilateral occipital lobes and inferior left frontal lobe compatible remote infarct. Background of volume loss and chronic microvascular ischemic changes with intracranial atherosclerosis. 4. No  acute cervical spine fracture. 5. Multilevel cervical spondylosis detailed above. Maximal changes C3-4 C4-5. 6. Multinodular thyroid gland. Largest nodule measuring 2.2 cm in left lobe. Could consider follow-up thyroid ultrasound following discussion with the patient on the clinical utility of this exam. This follows ACR consensus guidelines: Managing Incidental Thyroid Nodules Detected on Imaging: White Paper of the ACR Incidental Thyroid Findings Committee. J Am Coll Radiol 2015; 12:143-150. Electronically Signed   By: Lovena Le M.D.   On: 09/11/2018 20:36   Dg Femur Min 2 Views Right  Result Date: 09/11/2018 CLINICAL DATA:  Right hip pain after fall. EXAM: RIGHT FEMUR 2 VIEWS COMPARISON:  Radiograph of March 28, 2018. FINDINGS: Status post right total hip arthroplasty with surgical internal fixation of the distal right femur. No definite evidence of fracture is seen involving the femur. Nondisplaced fracture is seen involving the right inferior pubic ramus. IMPRESSION: Postsurgical changes as described above. No acute abnormality seen involving the right femur. Nondisplaced right inferior pubic ramus fracture is noted. Electronically Signed   By: Marijo Conception M.D.   On: 09/11/2018 20:43   Dg Hips Bilat W Or Wo Pelvis 3-4 Views  Result Date: 09/11/2018 CLINICAL DATA:  Right hip pain after fall. EXAM: DG HIP (WITH OR WITHOUT PELVIS) 3-4V BILAT COMPARISON:  None. FINDINGS: Status post right total hip arthroplasty. Left hip appears normal. Nondisplaced fracture is seen involving right inferior pubic ramus. No other bony abnormality  is noted. Calcified degenerating fibroids are noted in the pelvis. Diffuse osteopenia is noted. IMPRESSION: Nondisplaced fracture is seen involving right inferior pubic ramus. Status post right total hip arthroplasty. Electronically Signed   By: Marijo Conception M.D.   On: 09/11/2018 20:46    Lab Results: Basic Metabolic Panel: Recent Labs    09/19/18 0435  CREATININE 0.80   Liver Function Tests: No results for input(s): AST, ALT, ALKPHOS, BILITOT, PROT, ALBUMIN in the last 72 hours.   CBC: No results for input(s): WBC, NEUTROABS, HGB, HCT, MCV, PLT in the last 72 hours.  Recent Results (from the past 240 hour(s))  SARS CORONAVIRUS 2 (TAT 6-12 HRS)     Status: None   Collection Time: 09/11/18 11:25 PM  Result Value Ref Range Status   SARS Coronavirus 2 NEGATIVE NEGATIVE Final    Comment: (NOTE) SARS-CoV-2 target nucleic acids are NOT DETECTED. The SARS-CoV-2 RNA is generally detectable in upper and lower respiratory specimens during the acute phase of infection. Negative results do not preclude SARS-CoV-2 infection, do not rule out co-infections with other pathogens, and should not be used as the sole basis for treatment or other patient management decisions. Negative results must be combined with clinical observations, patient history, and epidemiological information. The expected result is Negative. Fact Sheet for Patients: SugarRoll.be Fact Sheet for Healthcare Providers: https://www.woods-mathews.com/ This test is not yet approved or cleared by the [place] FDA and  has been authorized for detection and/or diagnosis of SARS-CoV-2 by FDA under an Emergency Use Authorization (EUA). This EUA will remain  in effect (meaning this test can be used) for the duration of the COVID-19 declaration under Section 56 4(b)(1) of the Act, 21 U.S.C. section 360bbb-3(b)(1), unless the authorization is terminated or revoked sooner. Performed at  [facility], [place] [address], [place], [place] [address]      Hospital Course: This is a 85 year old who had been in her usual state of poor health at an assisted living facility.  She fell and did not have  immediate complaints but later complained of hip pain.  She was brought to the emergency department found to have a pelvic fracture.  She was admitted for treatment.  She had consultation with Dr. Aline Brochure who felt that she did not need any surgery.  She became combative which frequently happens when she comes to the hospital.  She improved over the next several days.  It was recommended that she go to skilled care facility but her insurance company denied that.  We attempted to get her back to her current assisted living facility but they felt they could not manage her.  She is being set up to go to another facility.  COVID testing is negative  Discharge Exam: Blood pressure 114/65, pulse (!) 59, temperature 98 F (36.7 C), temperature source Oral, resp. rate 16, height 5' (1.524 m), weight 43.6 kg, SpO2 97 %. She is confused.  Awake and alert however.  Not combative.  Chest is clear.  She is very hard of hearing.  She has very poor vision.  Disposition: To long-term care facility    Follow-up Information    Schedule an appointment as soon as possible for a visit  with Sinda Du, MD.   Specialty: Pulmonary Disease Why: As needed Contact information: [address] [place] [address] [phone]           Signed: Alonza Bogus   09/21/2018

## 2018-09-19 NOTE — TOC Progression Note (Addendum)
Transition of Care [facility]) - Progression Note    Patient Details  Name: [name] MRN: [id number] Date of Birth: 03/27/1920  Transition [facility]) CM/SW Contact  Shade Flood, LCSW Phone Number: 09/19/2018, 11:32 AM  Clinical Narrative:     TOC following. Per Marden Noble at [facility], he is working with pt's daughter to get pt into the [facility] location since that is near her in [place]. They may be able to admit pt as soon as tomorrow. Daughter apparently will pay for transportation.   Covering TOC will follow up tomorrow.  12:37 Per Marden Noble at [facility], pt will need a new negative COVID test before she can admit to [facility]. Messaged Dr. Luan Pulling to update. Anticipating earliest dc will be Friday. Will need to coordinate transport with EMS ahead of time due to distance and coordinate payment with daughter as the full transport will not be covered by insurance.  Expected Discharge Plan: Assisted Living Barriers to Discharge: Other (comment)(Need clarification as to plan-SNF v return to ALF)  Expected Discharge Plan and Services Expected Discharge Plan: Assisted Living   Discharge Planning Services: CM Consult   Living arrangements for the past 2 months: [place] Expected Discharge Date: 09/13/18                                     Social Determinants of Health (SDOH) Interventions    Readmission Risk Interventions Readmission Risk Prevention Plan 05/31/2018  Transportation Screening Complete  Palliative Care Screening Not Applicable  Medication Review (RN Care Manager) Complete  Some recent data might be hidden

## 2018-09-19 NOTE — Progress Notes (Signed)
Subjective: She remains confused.  Her assisted living facility says that they will not be able to take her back.  We are looking for another long-term care facility for her  Objective: Vital signs in last 24 hours: Temp:  [98 F (36.7 C)-98.3 F (36.8 C)] 98 F (36.7 C) (09/02 0509) Pulse Rate:  [59-66] 59 (09/02 0509) Resp:  [16-20] 16 (09/02 0509) BP: (114-144)/(50-66) 114/65 (09/02 0509) SpO2:  [96 %-98 %] 97 % (09/02 0509) Weight change:  Last BM Date: 09/17/18  Intake/Output from previous day: 09/01 0701 - 09/02 0700 In: 120 [P.O.:120] Out: -   PHYSICAL EXAM General appearance: alert and Hard of hearing and confused Resp: clear to auscultation bilaterally Cardio: regular rate and rhythm, S1, S2 normal, no murmur, click, rub or gallop GI: soft, non-tender; bowel sounds normal; no masses,  no organomegaly Extremities: extremities normal, atraumatic, no cyanosis or edema  Lab Results:  Results for orders placed or performed during the hospital encounter of 09/11/18 (from the past 48 hour(s))  Creatinine, serum     Status: None   Collection Time: 09/19/18  4:35 AM  Result Value Ref Range   Creatinine, Ser 0.80 0.44 - 1.00 mg/dL   GFR calc non Af Amer >60 >60 mL/min   GFR calc Af Amer >60 >60 mL/min    Comment: Performed at [facility], [address] [place], [place] [address]    ABGS No results for input(s): PHART, PO2ART, TCO2, HCO3 in the last 72 hours.  Invalid input(s): PCO2 CULTURES Recent Results (from the past 240 hour(s))  SARS CORONAVIRUS 2 (TAT 6-12 HRS)     Status: None   Collection Time: 09/11/18 11:25 PM  Result Value Ref Range Status   SARS Coronavirus 2 NEGATIVE NEGATIVE Final    Comment: (NOTE) SARS-CoV-2 target nucleic acids are NOT DETECTED. The SARS-CoV-2 RNA is generally detectable in upper and lower respiratory specimens during the acute phase of infection. Negative results do not preclude SARS-CoV-2 infection, do not rule  out co-infections with other pathogens, and should not be used as the sole basis for treatment or other patient management decisions. Negative results must be combined with clinical observations, patient history, and epidemiological information. The expected result is Negative. Fact Sheet for Patients: SugarRoll.be Fact Sheet for Healthcare Providers: https://www.woods-mathews.com/ This test is not yet approved or cleared by the [place] FDA and  has been authorized for detection and/or diagnosis of SARS-CoV-2 by FDA under an Emergency Use Authorization (EUA). This EUA will remain  in effect (meaning this test can be used) for the duration of the COVID-19 declaration under Section 56 4(b)(1) of the Act, 21 U.S.C. section 360bbb-3(b)(1), unless the authorization is terminated or revoked sooner. Performed at [facility], [place] [address], [place], [place] [address]    Studies/Results: No results found.  Medications:  Prior to Admission:  Medications Prior to Admission  Medication Sig Dispense Refill Last Dose  . acetaminophen (TYLENOL) 500 MG tablet Take 1,000 mg by mouth at bedtime.    09/10/2018 at Unknown time  . amLODipine (NORVASC) 10 MG tablet Take 1 tablet (10 mg total) by mouth daily. 30 tablet 5 09/11/2018 at Unknown time  . benazepril (LOTENSIN) 20 MG tablet Take 1 tablet (20 mg total) by mouth daily. 30 tablet 5 09/11/2018 at Unknown time  . bisacodyl (DULCOLAX) 10 MG suppository Place 1 suppository (10 mg total) rectally daily as needed for moderate constipation. 12 suppository 0 unknown  . conjugated estrogens (PREMARIN) vaginal  cream Place 1 Applicatorful vaginally at bedtime.   09/10/2018 at Unknown time  . LORazepam (ATIVAN) 0.5 MG tablet Take 1 tablet (0.5 mg total) by mouth every 4 (four) hours as needed for anxiety or sedation. 30 tablet 0 unknown  . metoprolol tartrate (LOPRESSOR) 25 MG tablet Take 1 tablet (25 mg  total) by mouth 2 (two) times daily. 60 tablet 5 09/11/2018 at 800  . mirabegron ER (MYRBETRIQ) 25 MG TB24 tablet Take 25 mg by mouth every morning.    09/11/2018 at Unknown time  . sertraline (ZOLOFT) 50 MG tablet Take 50 mg by mouth every morning.    09/11/2018 at Unknown time  . traZODone (DESYREL) 50 MG tablet Take 50 mg by mouth at bedtime.   09/10/2018 at Unknown time   Scheduled: . amLODipine  10 mg Oral Daily  . benazepril  20 mg Oral Daily  . enoxaparin (LOVENOX) injection  30 mg Subcutaneous Q24H  . feeding supplement (ENSURE ENLIVE)  237 mL Oral BID BM  . metoprolol tartrate  25 mg Oral BID  . mirabegron ER  25 mg Oral q morning - 10a  . sertraline  50 mg Oral q morning - 10a  . traZODone  50 mg Oral QHS   Continuous: . sodium chloride     [id number] **OR** acetaminophen, bisacodyl, LORazepam, LORazepam, morphine injection, ondansetron **OR** ondansetron (ZOFRAN) IV, oxyCODONE  Assesment: She was admitted with a pelvic fracture after a fall.  She became agitated and combative when she was admitted but that has resolved.  She is now back to her baseline mental status and is confused.  She has been able to walk 45 feet yesterday.  Her assisted living facility says they can take her back.  She is going to need to go to another long-term care facility.  She has dementia which is unchanged  She has had trouble with anxiety and depression and she is on treatment for that  She has hypertension and her blood pressure has been fluctuating Active Problems:   Essential hypertension, benign   Altered mental status   History of CVA (cerebrovascular accident) - by CT scan   Agitation   Dementia ([facility])   Pelvic fracture ([place])    Plan: Discharge once arrangements are made    LOS: 6 days   [name] 09/19/2018, 8:27 AM

## 2018-09-20 LAB — NOVEL CORONAVIRUS, NAA (HOSP ORDER, SEND-OUT TO REF LAB; TAT 18-24 HRS): SARS-CoV-2, NAA: NOT DETECTED

## 2018-09-20 NOTE — Progress Notes (Signed)
Subjective: She is overall about the same.  We are awaiting approval for her to be transferred to long-term care facility.  Still confused but more alert and not combative  Objective: Vital signs in last 24 hours: Temp:  [97.9 F (36.6 C)-98.3 F (36.8 C)] 97.9 F (36.6 C) (09/03 0446) Pulse Rate:  [63-82] 63 (09/03 0446) Resp:  [16-20] 16 (09/03 0446) BP: (116-146)/(59-65) 116/59 (09/03 0446) SpO2:  [97 %-98 %] 97 % (09/03 0446) Weight:  [42.1 kg] 42.1 kg (09/03 0500) Weight change:  Last BM Date: (pt unable to state)  Intake/Output from previous day: 09/02 0701 - 09/03 0700 In: 420 [P.O.:420] Out: -   PHYSICAL EXAM General appearance: Confused Resp: clear to auscultation bilaterally Cardio: regular rate and rhythm, S1, S2 normal, no murmur, click, rub or gallop GI: soft, non-tender; bowel sounds normal; no masses,  no organomegaly Extremities: extremities normal, atraumatic, no cyanosis or edema  Lab Results:  Results for orders placed or performed during the hospital encounter of 09/11/18 (from the past 48 hour(s))  Creatinine, serum     Status: None   Collection Time: 09/19/18  4:35 AM  Result Value Ref Range   Creatinine, Ser 0.80 0.44 - 1.00 mg/dL   GFR calc non Af Amer >60 >60 mL/min   GFR calc Af Amer >60 >60 mL/min    Comment: Performed at [facility], [address], [place], [place] [address]    ABGS No results for input(s): PHART, PO2ART, TCO2, HCO3 in the last 72 hours.  Invalid input(s): PCO2 CULTURES Recent Results (from the past 240 hour(s))  SARS CORONAVIRUS 2 (TAT 6-12 HRS)     Status: None   Collection Time: 09/11/18 11:25 PM  Result Value Ref Range Status   SARS Coronavirus 2 NEGATIVE NEGATIVE Final    Comment: (NOTE) SARS-CoV-2 target nucleic acids are NOT DETECTED. The SARS-CoV-2 RNA is generally detectable in upper and lower respiratory specimens during the acute phase of infection. Negative results do not preclude SARS-CoV-2 infection, do  not rule out co-infections with other pathogens, and should not be used as the sole basis for treatment or other patient management decisions. Negative results must be combined with clinical observations, patient history, and epidemiological information. The expected result is Negative. Fact Sheet for Patients: SugarRoll.be Fact Sheet for Healthcare Providers: https://www.woods-mathews.com/ This test is not yet approved or cleared by the [place] FDA and  has been authorized for detection and/or diagnosis of SARS-CoV-2 by FDA under an Emergency Use Authorization (EUA). This EUA will remain  in effect (meaning this test can be used) for the duration of the COVID-19 declaration under Section 56 4(b)(1) of the Act, 21 U.S.C. section 360bbb-3(b)(1), unless the authorization is terminated or revoked sooner. Performed at [facility], [place] [address], [place], [place] [address]    Studies/Results: No results found.  Medications:  Prior to Admission:  Medications Prior to Admission  Medication Sig Dispense Refill Last Dose  . acetaminophen (TYLENOL) 500 MG tablet Take 1,000 mg by mouth at bedtime.    09/10/2018 at Unknown time  . amLODipine (NORVASC) 10 MG tablet Take 1 tablet (10 mg total) by mouth daily. 30 tablet 5 09/11/2018 at Unknown time  . benazepril (LOTENSIN) 20 MG tablet Take 1 tablet (20 mg total) by mouth daily. 30 tablet 5 09/11/2018 at Unknown time  . bisacodyl (DULCOLAX) 10 MG suppository Place 1 suppository (10 mg total) rectally daily as needed for moderate constipation. 12 suppository 0 unknown  .  conjugated estrogens (PREMARIN) vaginal cream Place 1 Applicatorful vaginally at bedtime.   09/10/2018 at Unknown time  . LORazepam (ATIVAN) 0.5 MG tablet Take 1 tablet (0.5 mg total) by mouth every 4 (four) hours as needed for anxiety or sedation. 30 tablet 0 unknown  . metoprolol tartrate (LOPRESSOR) 25 MG tablet Take 1 tablet  (25 mg total) by mouth 2 (two) times daily. 60 tablet 5 09/11/2018 at 800  . mirabegron ER (MYRBETRIQ) 25 MG TB24 tablet Take 25 mg by mouth every morning.    09/11/2018 at Unknown time  . sertraline (ZOLOFT) 50 MG tablet Take 50 mg by mouth every morning.    09/11/2018 at Unknown time  . traZODone (DESYREL) 50 MG tablet Take 50 mg by mouth at bedtime.   09/10/2018 at Unknown time   Scheduled: . amLODipine  10 mg Oral Daily  . benazepril  20 mg Oral Daily  . enoxaparin (LOVENOX) injection  30 mg Subcutaneous Q24H  . feeding supplement (ENSURE ENLIVE)  237 mL Oral BID BM  . metoprolol tartrate  25 mg Oral BID  . mirabegron ER  25 mg Oral q morning - 10a  . sertraline  50 mg Oral q morning - 10a  . traZODone  50 mg Oral QHS   Continuous: . sodium chloride     [id number] **OR** acetaminophen, bisacodyl, LORazepam, LORazepam, morphine injection, ondansetron **OR** ondansetron (ZOFRAN) IV, oxyCODONE  Assesment: She was admitted with a fall and a fracture in the pelvis.  She had orthopedic consult and it was felt that she did not need surgical treatment.  She has dementia and she became confused and combative after admission but that is better now.  She has hypertension which is well controlled  She has overactive bladder and that seems about the same  She is very hard of hearing and has poor vision which makes communication difficult  She has had trouble with depression and that seems better Active Problems:   Essential hypertension, benign   Altered mental status   History of CVA (cerebrovascular accident) - by CT scan   Agitation   Dementia ([place])   Pelvic fracture ([place])    Plan: Transfer to long-term care facility once arrangements are made.  COVID testing underway    LOS: 7 days   Alonza Bogus 09/20/2018, 8:10 AM

## 2018-09-21 NOTE — Progress Notes (Signed)
Subjective: She is overall about the same.  She is calm.  Confused.  Very hard of hearing.  Objective: Vital signs in last 24 hours: Temp:  [98.2 F (36.8 C)-98.4 F (36.9 C)] 98.4 F (36.9 C) (09/04 0518) Pulse Rate:  [63-69] 63 (09/04 0518) Resp:  [16-17] 17 (09/04 0518) BP: (109-137)/(50-63) 129/59 (09/04 0518) SpO2:  [97 %-98 %] 98 % (09/04 0518) Weight:  [41.8 kg] 41.8 kg (09/04 0500) Weight change: -0.3 kg Last BM Date: (pt uanble to state)  Intake/Output from previous day: 09/03 0701 - 09/04 0700 In: 240 [P.O.:240] Out: -   PHYSICAL EXAM General appearance: alert and Confused Resp: clear to auscultation bilaterally Cardio: regular rate and rhythm, S1, S2 normal, no murmur, click, rub or gallop GI: soft, non-tender; bowel sounds normal; no masses,  no organomegaly Extremities: extremities normal, atraumatic, no cyanosis or edema  Lab Results:  Results for orders placed or performed during the hospital encounter of 09/11/18 (from the past 48 hour(s))  Novel Coronavirus, NAA (hospital order; send-out to ref lab)     Status: None   Collection Time: 09/19/18  5:56 PM   Specimen: Nasopharyngeal Swab; Respiratory  Result Value Ref Range   SARS-CoV-2, NAA NOT DETECTED NOT DETECTED    Comment: (NOTE) This nucleic acid amplification test was developed and its performance characteristics determined by [employer]. Nucleic acid amplification tests include PCR and TMA. This test has not been FDA cleared or approved. This test has been authorized by FDA under an Emergency Use Authorization (EUA). This test is only authorized for the duration of time the declaration that circumstances exist justifying the authorization of the emergency use of in vitro diagnostic tests for detection of SARS-CoV-2 virus and/or diagnosis of COVID-19 infection under section 564(b)(1) of the Act, 21 U.S.C. [id number]) (1), unless the authorization is terminated or revoked sooner. When  diagnostic testing is negative, the possibility of a false negative result should be considered in the context of a patient's recent exposures and the presence of clinical signs and symptoms consistent with COVID-19. An individual without symptoms of COVID- 19 and who is not shedding SARS-CoV-2 vi rus would expect to have a negative (not detected) result in this assay. Performed At: [facility] [place], [place] [id number] Rush Farmer MD [id number]    Coronavirus Source NASOPHARYNGEAL     Comment: Performed at [facility], [address], [place], [place] [address]    ABGS No results for input(s): PHART, PO2ART, TCO2, HCO3 in the last 72 hours.  Invalid input(s): PCO2 CULTURES Recent Results (from the past 240 hour(s))  SARS CORONAVIRUS 2 (TAT 6-12 HRS)     Status: None   Collection Time: 09/11/18 11:25 PM  Result Value Ref Range Status   SARS Coronavirus 2 NEGATIVE NEGATIVE Final    Comment: (NOTE) SARS-CoV-2 target nucleic acids are NOT DETECTED. The SARS-CoV-2 RNA is generally detectable in upper and lower respiratory specimens during the acute phase of infection. Negative results do not preclude SARS-CoV-2 infection, do not rule out co-infections with other pathogens, and should not be used as the sole basis for treatment or other patient management decisions. Negative results must be combined with clinical observations, patient history, and epidemiological information. The expected result is Negative. Fact Sheet for Patients: SugarRoll.be Fact Sheet for Healthcare Providers: https://www.woods-mathews.com/ This test is not yet approved or cleared by the [place] FDA and  has been authorized for detection and/or diagnosis of SARS-CoV-2 by FDA under an Emergency Use  Authorization (EUA). This EUA will remain  in effect (meaning this test can be used) for the duration of the COVID-19 declaration under  Section 56 4(b)(1) of the Act, 21 U.S.C. section 360bbb-3(b)(1), unless the authorization is terminated or revoked sooner. Performed at [facility], [place] [address] [place], [place] [address]   Novel Coronavirus, NAA (hospital order; send-out to ref lab)     Status: None   Collection Time: 09/19/18  5:56 PM   Specimen: Nasopharyngeal Swab; Respiratory  Result Value Ref Range Status   SARS-CoV-2, NAA NOT DETECTED NOT DETECTED Final    Comment: (NOTE) This nucleic acid amplification test was developed and its performance characteristics determined by [employer], [employer] and [employer]. Nucleic acid amplification tests include PCR and TMA. This test has not been FDA cleared or approved. This test has been authorized by FDA under an Emergency Use Authorization (EUA). This test is only authorized for the duration of time the declaration that circumstances exist justifying the authorization of the emergency use of in vitro diagnostic tests for detection of SARS-CoV-2 virus and/or diagnosis of COVID-19 infection under section 564(b)(1) of the Act, 21 U.S.C. [id number]) (1), unless the authorization is terminated or revoked sooner. When diagnostic testing is negative, the possibility of a false negative result should be considered in the context of a patient's recent exposures and the presence of clinical signs and symptoms consistent with COVID-19. An individual without symptoms of COVID- 19 and who is not shedding SARS-CoV-2 vi rus would expect to have a negative (not detected) result in this assay. Performed At: [facility] [place], [place] [id number] Rush Farmer MD [id number]    [place]  Final    Comment: Performed at [facility], [address], [place], [place] [address]   Studies/Results: No results found.  Medications:  Prior to Admission:  Medications Prior to Admission  Medication Sig Dispense Refill Last Dose  .  acetaminophen (TYLENOL) 500 MG tablet Take 1,000 mg by mouth at bedtime.    09/10/2018 at Unknown time  . amLODipine (NORVASC) 10 MG tablet Take 1 tablet (10 mg total) by mouth daily. 30 tablet 5 09/11/2018 at Unknown time  . benazepril (LOTENSIN) 20 MG tablet Take 1 tablet (20 mg total) by mouth daily. 30 tablet 5 09/11/2018 at Unknown time  . bisacodyl (DULCOLAX) 10 MG suppository Place 1 suppository (10 mg total) rectally daily as needed for moderate constipation. 12 suppository 0 unknown  . conjugated estrogens (PREMARIN) vaginal cream Place 1 Applicatorful vaginally at bedtime.   09/10/2018 at Unknown time  . LORazepam (ATIVAN) 0.5 MG tablet Take 1 tablet (0.5 mg total) by mouth every 4 (four) hours as needed for anxiety or sedation. 30 tablet 0 unknown  . metoprolol tartrate (LOPRESSOR) 25 MG tablet Take 1 tablet (25 mg total) by mouth 2 (two) times daily. 60 tablet 5 09/11/2018 at 800  . mirabegron ER (MYRBETRIQ) 25 MG TB24 tablet Take 25 mg by mouth every morning.    09/11/2018 at Unknown time  . sertraline (ZOLOFT) 50 MG tablet Take 50 mg by mouth every morning.    09/11/2018 at Unknown time  . traZODone (DESYREL) 50 MG tablet Take 50 mg by mouth at bedtime.   09/10/2018 at Unknown time   Scheduled: . amLODipine  10 mg Oral Daily  . benazepril  20 mg Oral Daily  . enoxaparin (LOVENOX) injection  30 mg Subcutaneous Q24H  . feeding supplement (ENSURE ENLIVE)  237 mL Oral BID BM  .  metoprolol tartrate  25 mg Oral BID  . mirabegron ER  25 mg Oral q morning - 10a  . sertraline  50 mg Oral q morning - 10a  . traZODone  50 mg Oral QHS   Continuous: . sodium chloride     [id number] **OR** acetaminophen, bisacodyl, LORazepam, LORazepam, morphine injection, ondansetron **OR** ondansetron (ZOFRAN) IV, oxyCODONE  Assesment: She was admitted with a pelvic fracture.  She is doing okay with that.  She has dementia and became confused and combative.  That has resolved.  She is still confused but  not combative.  She was felt not to need operative treatment.  She had initially been planned to go back to her assisted living facility but they felt they could not manage her.  Arrangements have been made for her to go to another long-term facility. Active Problems:   Essential hypertension, benign   Altered mental status   History of CVA (cerebrovascular accident) - by CT scan   Agitation   Dementia ([place])   Pelvic fracture [facility])    Plan: Discharge today if arrangements are complete    LOS: 8 days   Alonza Bogus 09/21/2018, 8:06 AM

## 2018-09-21 NOTE — Progress Notes (Signed)
Report called to [name], nurse at [facility] in [place], [place] and all questions answered.  Patient awaiting transport from [employer] [employer] to transport to facility. Patient's daughter, [name], made aware of patient's discharge.

## 2018-09-21 NOTE — Clinical Social Work Note (Signed)
Patient's discharge clinicals including updated Covid testing sent to [facility]. Marden Noble will send documentation to partner facility, [facility].   Daughter, [name], advised of discharge.   [facility] scheduled to pick patient up at 10:30 to transport to facility.   RN, Quillian Quince, to call report.  LCSW signing off.

## 2018-09-23 DIAGNOSIS — I10 Essential (primary) hypertension: Secondary | ICD-10-CM | POA: Diagnosis not present

## 2018-09-23 DIAGNOSIS — N3281 Overactive bladder: Secondary | ICD-10-CM | POA: Diagnosis not present

## 2018-09-23 DIAGNOSIS — F0150 Vascular dementia without behavioral disturbance: Secondary | ICD-10-CM | POA: Diagnosis not present

## 2018-09-23 DIAGNOSIS — S3289XD Fracture of other parts of pelvis, subsequent encounter for fracture with routine healing: Secondary | ICD-10-CM | POA: Diagnosis not present

## 2018-09-23 DIAGNOSIS — G4700 Insomnia, unspecified: Secondary | ICD-10-CM | POA: Diagnosis not present

## 2018-09-23 DIAGNOSIS — F339 Major depressive disorder, recurrent, unspecified: Secondary | ICD-10-CM | POA: Diagnosis not present

## 2018-09-23 DIAGNOSIS — M6281 Muscle weakness (generalized): Secondary | ICD-10-CM | POA: Diagnosis not present

## 2018-09-23 DIAGNOSIS — I639 Cerebral infarction, unspecified: Secondary | ICD-10-CM | POA: Diagnosis not present

## 2018-09-23 DIAGNOSIS — R451 Restlessness and agitation: Secondary | ICD-10-CM | POA: Diagnosis not present

## 2018-09-24 DIAGNOSIS — R2681 Unsteadiness on feet: Secondary | ICD-10-CM | POA: Diagnosis not present

## 2018-09-24 DIAGNOSIS — Z8673 Personal history of transient ischemic attack (TIA), and cerebral infarction without residual deficits: Secondary | ICD-10-CM | POA: Diagnosis not present

## 2018-09-24 DIAGNOSIS — R262 Difficulty in walking, not elsewhere classified: Secondary | ICD-10-CM | POA: Diagnosis not present

## 2018-09-24 DIAGNOSIS — S32501D Unspecified fracture of right pubis, subsequent encounter for fracture with routine healing: Secondary | ICD-10-CM | POA: Diagnosis not present

## 2018-09-24 DIAGNOSIS — R41841 Cognitive communication deficit: Secondary | ICD-10-CM | POA: Diagnosis not present

## 2018-09-24 DIAGNOSIS — R1312 Dysphagia, oropharyngeal phase: Secondary | ICD-10-CM | POA: Diagnosis not present

## 2018-09-25 DIAGNOSIS — F339 Major depressive disorder, recurrent, unspecified: Secondary | ICD-10-CM | POA: Diagnosis not present

## 2018-09-25 DIAGNOSIS — N3281 Overactive bladder: Secondary | ICD-10-CM | POA: Diagnosis not present

## 2018-09-25 DIAGNOSIS — I639 Cerebral infarction, unspecified: Secondary | ICD-10-CM | POA: Diagnosis not present

## 2018-09-25 DIAGNOSIS — I10 Essential (primary) hypertension: Secondary | ICD-10-CM | POA: Diagnosis not present

## 2018-09-25 DIAGNOSIS — R451 Restlessness and agitation: Secondary | ICD-10-CM | POA: Diagnosis not present

## 2018-09-25 DIAGNOSIS — F0150 Vascular dementia without behavioral disturbance: Secondary | ICD-10-CM | POA: Diagnosis not present

## 2018-09-25 DIAGNOSIS — S3289XD Fracture of other parts of pelvis, subsequent encounter for fracture with routine healing: Secondary | ICD-10-CM | POA: Diagnosis not present

## 2018-09-25 DIAGNOSIS — G4700 Insomnia, unspecified: Secondary | ICD-10-CM | POA: Diagnosis not present

## 2018-09-25 DIAGNOSIS — M6281 Muscle weakness (generalized): Secondary | ICD-10-CM | POA: Diagnosis not present

## 2018-09-27 DIAGNOSIS — M6281 Muscle weakness (generalized): Secondary | ICD-10-CM | POA: Diagnosis not present

## 2018-09-27 DIAGNOSIS — S3289XD Fracture of other parts of pelvis, subsequent encounter for fracture with routine healing: Secondary | ICD-10-CM | POA: Diagnosis not present

## 2018-09-27 DIAGNOSIS — I10 Essential (primary) hypertension: Secondary | ICD-10-CM | POA: Diagnosis not present

## 2018-09-27 DIAGNOSIS — F0150 Vascular dementia without behavioral disturbance: Secondary | ICD-10-CM | POA: Diagnosis not present

## 2018-09-27 DIAGNOSIS — R451 Restlessness and agitation: Secondary | ICD-10-CM | POA: Diagnosis not present

## 2018-09-27 DIAGNOSIS — F339 Major depressive disorder, recurrent, unspecified: Secondary | ICD-10-CM | POA: Diagnosis not present

## 2018-09-27 DIAGNOSIS — N3281 Overactive bladder: Secondary | ICD-10-CM | POA: Diagnosis not present

## 2018-09-27 DIAGNOSIS — G4700 Insomnia, unspecified: Secondary | ICD-10-CM | POA: Diagnosis not present

## 2018-09-27 DIAGNOSIS — I639 Cerebral infarction, unspecified: Secondary | ICD-10-CM | POA: Diagnosis not present

## 2018-09-28 DIAGNOSIS — G4700 Insomnia, unspecified: Secondary | ICD-10-CM | POA: Diagnosis not present

## 2018-09-28 DIAGNOSIS — I6350 Cerebral infarction due to unspecified occlusion or stenosis of unspecified cerebral artery: Secondary | ICD-10-CM | POA: Diagnosis not present

## 2018-09-28 DIAGNOSIS — F339 Major depressive disorder, recurrent, unspecified: Secondary | ICD-10-CM | POA: Diagnosis not present

## 2018-09-28 DIAGNOSIS — N393 Stress incontinence (female) (male): Secondary | ICD-10-CM | POA: Diagnosis not present

## 2018-09-28 DIAGNOSIS — M6281 Muscle weakness (generalized): Secondary | ICD-10-CM | POA: Diagnosis not present

## 2018-09-28 DIAGNOSIS — S32810D Multiple fractures of pelvis with stable disruption of pelvic ring, subsequent encounter for fracture with routine healing: Secondary | ICD-10-CM | POA: Diagnosis not present

## 2018-09-28 DIAGNOSIS — F0390 Unspecified dementia without behavioral disturbance: Secondary | ICD-10-CM | POA: Diagnosis not present

## 2018-09-29 DIAGNOSIS — S32810D Multiple fractures of pelvis with stable disruption of pelvic ring, subsequent encounter for fracture with routine healing: Secondary | ICD-10-CM | POA: Diagnosis not present

## 2018-09-29 DIAGNOSIS — R451 Restlessness and agitation: Secondary | ICD-10-CM | POA: Diagnosis not present

## 2018-09-29 DIAGNOSIS — I6350 Cerebral infarction due to unspecified occlusion or stenosis of unspecified cerebral artery: Secondary | ICD-10-CM | POA: Diagnosis not present

## 2018-09-29 DIAGNOSIS — F0150 Vascular dementia without behavioral disturbance: Secondary | ICD-10-CM | POA: Diagnosis not present

## 2018-09-29 DIAGNOSIS — F339 Major depressive disorder, recurrent, unspecified: Secondary | ICD-10-CM | POA: Diagnosis not present

## 2018-09-29 DIAGNOSIS — S3289XD Fracture of other parts of pelvis, subsequent encounter for fracture with routine healing: Secondary | ICD-10-CM | POA: Diagnosis not present

## 2018-09-29 DIAGNOSIS — F0390 Unspecified dementia without behavioral disturbance: Secondary | ICD-10-CM | POA: Diagnosis not present

## 2018-09-29 DIAGNOSIS — I10 Essential (primary) hypertension: Secondary | ICD-10-CM | POA: Diagnosis not present

## 2018-09-29 DIAGNOSIS — J449 Chronic obstructive pulmonary disease, unspecified: Secondary | ICD-10-CM | POA: Diagnosis not present

## 2018-09-29 DIAGNOSIS — G4700 Insomnia, unspecified: Secondary | ICD-10-CM | POA: Diagnosis not present

## 2018-09-29 DIAGNOSIS — N3281 Overactive bladder: Secondary | ICD-10-CM | POA: Diagnosis not present

## 2018-09-29 DIAGNOSIS — M6281 Muscle weakness (generalized): Secondary | ICD-10-CM | POA: Diagnosis not present

## 2018-10-03 DIAGNOSIS — N3281 Overactive bladder: Secondary | ICD-10-CM | POA: Diagnosis not present

## 2018-10-03 DIAGNOSIS — M6281 Muscle weakness (generalized): Secondary | ICD-10-CM | POA: Diagnosis not present

## 2018-10-03 DIAGNOSIS — G4700 Insomnia, unspecified: Secondary | ICD-10-CM | POA: Diagnosis not present

## 2018-10-03 DIAGNOSIS — F339 Major depressive disorder, recurrent, unspecified: Secondary | ICD-10-CM | POA: Diagnosis not present

## 2018-10-03 DIAGNOSIS — F0390 Unspecified dementia without behavioral disturbance: Secondary | ICD-10-CM | POA: Diagnosis not present

## 2018-10-03 DIAGNOSIS — I6350 Cerebral infarction due to unspecified occlusion or stenosis of unspecified cerebral artery: Secondary | ICD-10-CM | POA: Diagnosis not present

## 2018-10-03 DIAGNOSIS — S32810D Multiple fractures of pelvis with stable disruption of pelvic ring, subsequent encounter for fracture with routine healing: Secondary | ICD-10-CM | POA: Diagnosis not present

## 2018-10-03 DIAGNOSIS — I10 Essential (primary) hypertension: Secondary | ICD-10-CM | POA: Diagnosis not present

## 2018-10-06 DIAGNOSIS — S32810D Multiple fractures of pelvis with stable disruption of pelvic ring, subsequent encounter for fracture with routine healing: Secondary | ICD-10-CM | POA: Diagnosis not present

## 2018-10-06 DIAGNOSIS — F339 Major depressive disorder, recurrent, unspecified: Secondary | ICD-10-CM | POA: Diagnosis not present

## 2018-10-06 DIAGNOSIS — M6281 Muscle weakness (generalized): Secondary | ICD-10-CM | POA: Diagnosis not present

## 2018-10-06 DIAGNOSIS — N3281 Overactive bladder: Secondary | ICD-10-CM | POA: Diagnosis not present

## 2018-10-06 DIAGNOSIS — G4700 Insomnia, unspecified: Secondary | ICD-10-CM | POA: Diagnosis not present

## 2018-10-06 DIAGNOSIS — I10 Essential (primary) hypertension: Secondary | ICD-10-CM | POA: Diagnosis not present

## 2018-10-06 DIAGNOSIS — R451 Restlessness and agitation: Secondary | ICD-10-CM | POA: Diagnosis not present

## 2018-10-06 DIAGNOSIS — I6350 Cerebral infarction due to unspecified occlusion or stenosis of unspecified cerebral artery: Secondary | ICD-10-CM | POA: Diagnosis not present

## 2018-10-06 DIAGNOSIS — F0390 Unspecified dementia without behavioral disturbance: Secondary | ICD-10-CM | POA: Diagnosis not present

## 2018-10-10 DIAGNOSIS — F0390 Unspecified dementia without behavioral disturbance: Secondary | ICD-10-CM | POA: Diagnosis not present

## 2018-10-10 DIAGNOSIS — R451 Restlessness and agitation: Secondary | ICD-10-CM | POA: Diagnosis not present

## 2018-10-10 DIAGNOSIS — G4700 Insomnia, unspecified: Secondary | ICD-10-CM | POA: Diagnosis not present

## 2018-10-10 DIAGNOSIS — F339 Major depressive disorder, recurrent, unspecified: Secondary | ICD-10-CM | POA: Diagnosis not present

## 2018-10-10 DIAGNOSIS — R410 Disorientation, unspecified: Secondary | ICD-10-CM | POA: Diagnosis not present

## 2018-10-11 DIAGNOSIS — I10 Essential (primary) hypertension: Secondary | ICD-10-CM | POA: Diagnosis not present

## 2018-10-13 DIAGNOSIS — I10 Essential (primary) hypertension: Secondary | ICD-10-CM | POA: Diagnosis not present

## 2018-10-13 DIAGNOSIS — N3281 Overactive bladder: Secondary | ICD-10-CM | POA: Diagnosis not present

## 2018-10-13 DIAGNOSIS — G4700 Insomnia, unspecified: Secondary | ICD-10-CM | POA: Diagnosis not present

## 2018-10-13 DIAGNOSIS — F0390 Unspecified dementia without behavioral disturbance: Secondary | ICD-10-CM | POA: Diagnosis not present

## 2018-10-13 DIAGNOSIS — F339 Major depressive disorder, recurrent, unspecified: Secondary | ICD-10-CM | POA: Diagnosis not present

## 2018-10-13 DIAGNOSIS — M6281 Muscle weakness (generalized): Secondary | ICD-10-CM | POA: Diagnosis not present

## 2018-10-13 DIAGNOSIS — S32810D Multiple fractures of pelvis with stable disruption of pelvic ring, subsequent encounter for fracture with routine healing: Secondary | ICD-10-CM | POA: Diagnosis not present

## 2018-10-13 DIAGNOSIS — I6350 Cerebral infarction due to unspecified occlusion or stenosis of unspecified cerebral artery: Secondary | ICD-10-CM | POA: Diagnosis not present

## 2018-10-16 DIAGNOSIS — F0150 Vascular dementia without behavioral disturbance: Secondary | ICD-10-CM | POA: Diagnosis not present

## 2018-10-16 DIAGNOSIS — F064 Anxiety disorder due to known physiological condition: Secondary | ICD-10-CM | POA: Diagnosis not present

## 2018-10-16 DIAGNOSIS — F320 Major depressive disorder, single episode, mild: Secondary | ICD-10-CM | POA: Diagnosis not present

## 2018-10-18 DIAGNOSIS — R1312 Dysphagia, oropharyngeal phase: Secondary | ICD-10-CM | POA: Diagnosis not present

## 2018-10-18 DIAGNOSIS — R262 Difficulty in walking, not elsewhere classified: Secondary | ICD-10-CM | POA: Diagnosis not present

## 2018-10-18 DIAGNOSIS — R2681 Unsteadiness on feet: Secondary | ICD-10-CM | POA: Diagnosis not present

## 2018-10-18 DIAGNOSIS — R41841 Cognitive communication deficit: Secondary | ICD-10-CM | POA: Diagnosis not present

## 2018-10-18 DIAGNOSIS — S32501D Unspecified fracture of right pubis, subsequent encounter for fracture with routine healing: Secondary | ICD-10-CM | POA: Diagnosis not present

## 2018-10-22 DIAGNOSIS — I6350 Cerebral infarction due to unspecified occlusion or stenosis of unspecified cerebral artery: Secondary | ICD-10-CM | POA: Diagnosis not present

## 2018-10-22 DIAGNOSIS — M6281 Muscle weakness (generalized): Secondary | ICD-10-CM | POA: Diagnosis not present

## 2018-10-22 DIAGNOSIS — I10 Essential (primary) hypertension: Secondary | ICD-10-CM | POA: Diagnosis not present

## 2018-10-22 DIAGNOSIS — N3281 Overactive bladder: Secondary | ICD-10-CM | POA: Diagnosis not present

## 2018-10-22 DIAGNOSIS — K5909 Other constipation: Secondary | ICD-10-CM | POA: Diagnosis not present

## 2018-11-08 DIAGNOSIS — G4700 Insomnia, unspecified: Secondary | ICD-10-CM | POA: Diagnosis not present

## 2018-11-08 DIAGNOSIS — F339 Major depressive disorder, recurrent, unspecified: Secondary | ICD-10-CM | POA: Diagnosis not present

## 2018-11-08 DIAGNOSIS — F0150 Vascular dementia without behavioral disturbance: Secondary | ICD-10-CM | POA: Diagnosis not present

## 2018-11-10 DIAGNOSIS — F339 Major depressive disorder, recurrent, unspecified: Secondary | ICD-10-CM | POA: Diagnosis not present

## 2018-11-10 DIAGNOSIS — R451 Restlessness and agitation: Secondary | ICD-10-CM | POA: Diagnosis not present

## 2018-11-10 DIAGNOSIS — F0150 Vascular dementia without behavioral disturbance: Secondary | ICD-10-CM | POA: Diagnosis not present

## 2018-11-10 DIAGNOSIS — F419 Anxiety disorder, unspecified: Secondary | ICD-10-CM | POA: Diagnosis not present

## 2018-11-13 DIAGNOSIS — F339 Major depressive disorder, recurrent, unspecified: Secondary | ICD-10-CM | POA: Diagnosis not present

## 2018-11-13 DIAGNOSIS — G4700 Insomnia, unspecified: Secondary | ICD-10-CM | POA: Diagnosis not present

## 2018-11-13 DIAGNOSIS — F419 Anxiety disorder, unspecified: Secondary | ICD-10-CM | POA: Diagnosis not present

## 2018-11-13 DIAGNOSIS — R451 Restlessness and agitation: Secondary | ICD-10-CM | POA: Diagnosis not present

## 2018-11-13 DIAGNOSIS — F0150 Vascular dementia without behavioral disturbance: Secondary | ICD-10-CM | POA: Diagnosis not present

## 2018-11-24 DIAGNOSIS — R399 Unspecified symptoms and signs involving the genitourinary system: Secondary | ICD-10-CM | POA: Diagnosis not present

## 2018-11-27 DIAGNOSIS — N390 Urinary tract infection, site not specified: Secondary | ICD-10-CM | POA: Diagnosis not present

## 2018-12-10 DIAGNOSIS — G4700 Insomnia, unspecified: Secondary | ICD-10-CM | POA: Diagnosis not present

## 2018-12-10 DIAGNOSIS — R1030 Lower abdominal pain, unspecified: Secondary | ICD-10-CM | POA: Diagnosis not present

## 2018-12-10 DIAGNOSIS — R4182 Altered mental status, unspecified: Secondary | ICD-10-CM | POA: Diagnosis not present

## 2018-12-10 DIAGNOSIS — F339 Major depressive disorder, recurrent, unspecified: Secondary | ICD-10-CM | POA: Diagnosis not present

## 2018-12-10 DIAGNOSIS — F0150 Vascular dementia without behavioral disturbance: Secondary | ICD-10-CM | POA: Diagnosis not present

## 2018-12-10 DIAGNOSIS — F419 Anxiety disorder, unspecified: Secondary | ICD-10-CM | POA: Diagnosis not present

## 2018-12-11 DIAGNOSIS — I10 Essential (primary) hypertension: Secondary | ICD-10-CM | POA: Diagnosis not present

## 2018-12-24 DIAGNOSIS — F064 Anxiety disorder due to known physiological condition: Secondary | ICD-10-CM | POA: Diagnosis not present

## 2018-12-24 DIAGNOSIS — F0150 Vascular dementia without behavioral disturbance: Secondary | ICD-10-CM | POA: Diagnosis not present

## 2018-12-24 DIAGNOSIS — F320 Major depressive disorder, single episode, mild: Secondary | ICD-10-CM | POA: Diagnosis not present

## 2018-12-24 DIAGNOSIS — F339 Major depressive disorder, recurrent, unspecified: Secondary | ICD-10-CM | POA: Diagnosis not present

## 2018-12-24 DIAGNOSIS — G4700 Insomnia, unspecified: Secondary | ICD-10-CM | POA: Diagnosis not present

## 2018-12-26 DIAGNOSIS — M6281 Muscle weakness (generalized): Secondary | ICD-10-CM | POA: Diagnosis not present

## 2018-12-26 DIAGNOSIS — G4700 Insomnia, unspecified: Secondary | ICD-10-CM | POA: Diagnosis not present

## 2018-12-26 DIAGNOSIS — I6350 Cerebral infarction due to unspecified occlusion or stenosis of unspecified cerebral artery: Secondary | ICD-10-CM | POA: Diagnosis not present

## 2018-12-26 DIAGNOSIS — K5909 Other constipation: Secondary | ICD-10-CM | POA: Diagnosis not present

## 2018-12-26 DIAGNOSIS — R451 Restlessness and agitation: Secondary | ICD-10-CM | POA: Diagnosis not present

## 2018-12-26 DIAGNOSIS — I10 Essential (primary) hypertension: Secondary | ICD-10-CM | POA: Diagnosis not present

## 2018-12-26 DIAGNOSIS — N3281 Overactive bladder: Secondary | ICD-10-CM | POA: Diagnosis not present

## 2018-12-28 DIAGNOSIS — N390 Urinary tract infection, site not specified: Secondary | ICD-10-CM | POA: Diagnosis not present

## 2018-12-28 DIAGNOSIS — F320 Major depressive disorder, single episode, mild: Secondary | ICD-10-CM | POA: Diagnosis not present

## 2018-12-28 DIAGNOSIS — R451 Restlessness and agitation: Secondary | ICD-10-CM | POA: Diagnosis not present

## 2018-12-28 DIAGNOSIS — F064 Anxiety disorder due to known physiological condition: Secondary | ICD-10-CM | POA: Diagnosis not present

## 2018-12-28 DIAGNOSIS — G4700 Insomnia, unspecified: Secondary | ICD-10-CM | POA: Diagnosis not present

## 2018-12-29 DIAGNOSIS — F320 Major depressive disorder, single episode, mild: Secondary | ICD-10-CM | POA: Diagnosis not present

## 2018-12-29 DIAGNOSIS — F064 Anxiety disorder due to known physiological condition: Secondary | ICD-10-CM | POA: Diagnosis not present

## 2018-12-29 DIAGNOSIS — R451 Restlessness and agitation: Secondary | ICD-10-CM | POA: Diagnosis not present

## 2018-12-29 DIAGNOSIS — G4700 Insomnia, unspecified: Secondary | ICD-10-CM | POA: Diagnosis not present

## 2018-12-29 DIAGNOSIS — F0391 Unspecified dementia with behavioral disturbance: Secondary | ICD-10-CM | POA: Diagnosis not present

## 2018-12-31 DIAGNOSIS — F0151 Vascular dementia with behavioral disturbance: Secondary | ICD-10-CM | POA: Diagnosis not present

## 2018-12-31 DIAGNOSIS — F331 Major depressive disorder, recurrent, moderate: Secondary | ICD-10-CM | POA: Diagnosis not present

## 2018-12-31 DIAGNOSIS — G4701 Insomnia due to medical condition: Secondary | ICD-10-CM | POA: Diagnosis not present

## 2018-12-31 DIAGNOSIS — F064 Anxiety disorder due to known physiological condition: Secondary | ICD-10-CM | POA: Diagnosis not present

## 2019-01-07 DIAGNOSIS — F339 Major depressive disorder, recurrent, unspecified: Secondary | ICD-10-CM | POA: Diagnosis not present

## 2019-02-18 DEATH — deceased

## 2019-08-12 IMAGING — CT CT HEAD WITHOUT CONTRAST
4 of 6 series · 16 of 47 positions shown, 18 images · non-contrast
Comparison: 03/28/2018

CLINICAL DATA: Altered level of consciousness, unexplained. History
of acute delirium. History of dementia, and hypertension.

EXAM:
CT HEAD WITHOUT CONTRAST
TECHNIQUE: Contiguous axial images were obtained from the base of the skull
through the vertex without intravenous contrast.

[Series 3: head w o · axial · 0.39mm/px · z∈[+1460,+1570]mm · 7 of 30 slices shown, 9 images (1 of 2)]
[im 4/30  brain]
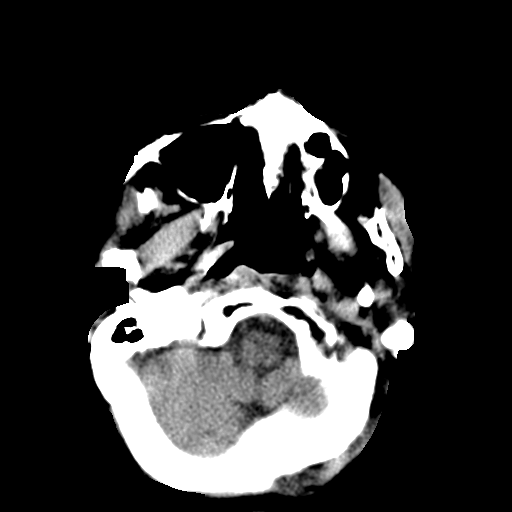
[im 4/30  bone]
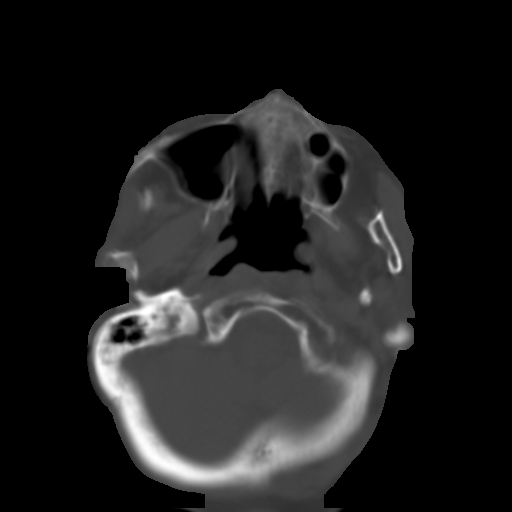
[im 8/30  brain]
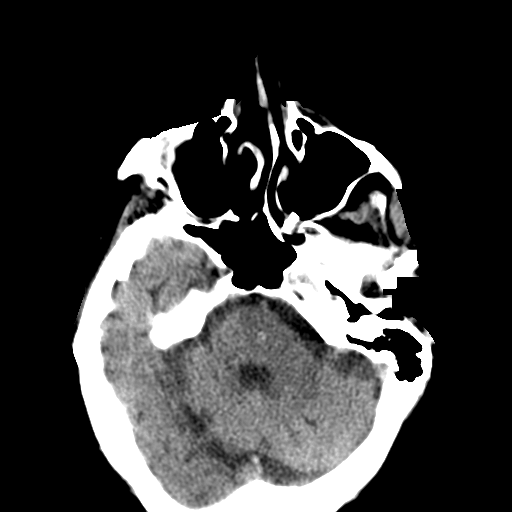
[im 11/30  brain]
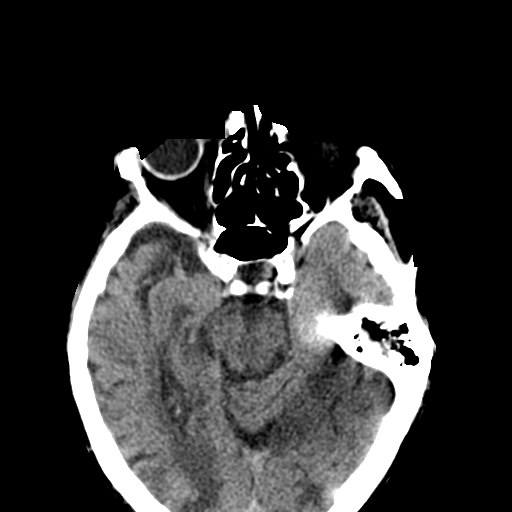
[im 15/30  brain]
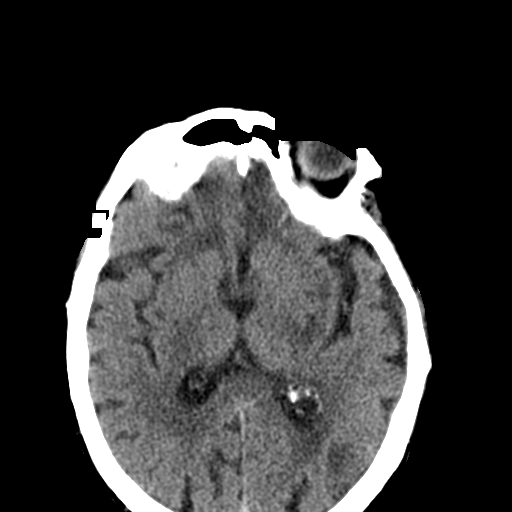
[im 19/30  brain]
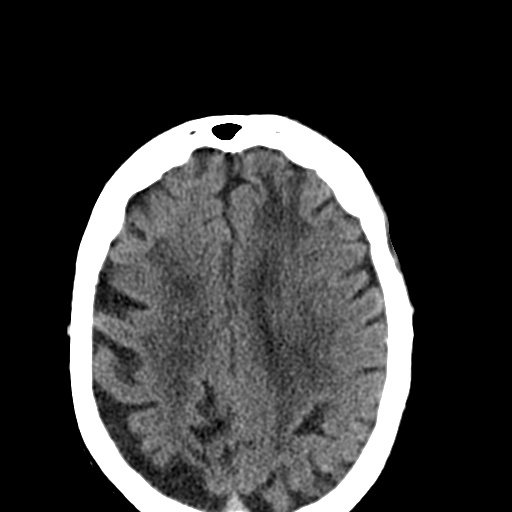
[im 19/30  bone]
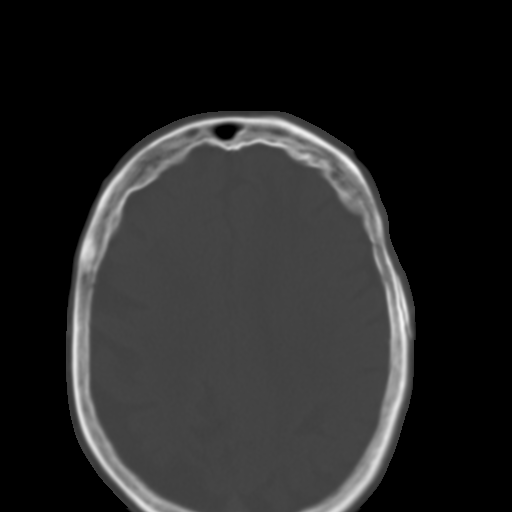
[im 22/30  brain]
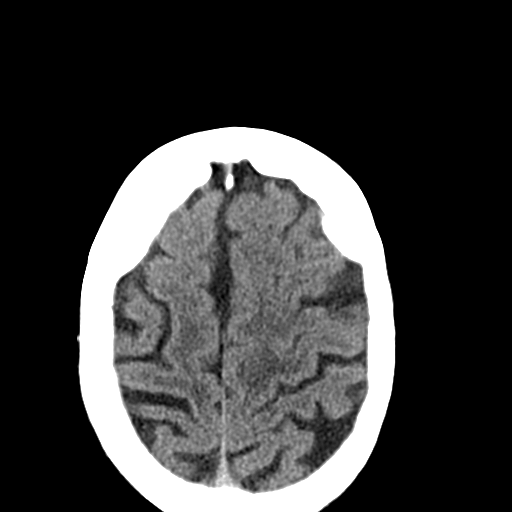
[im 26/30  brain]
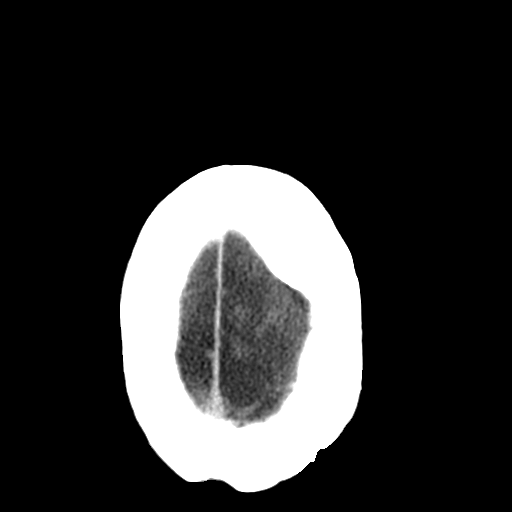

[Series 5: coronal soft · coronal · 0.33mm/px · 3 of 74 slices shown]
[im 25/74  brain]
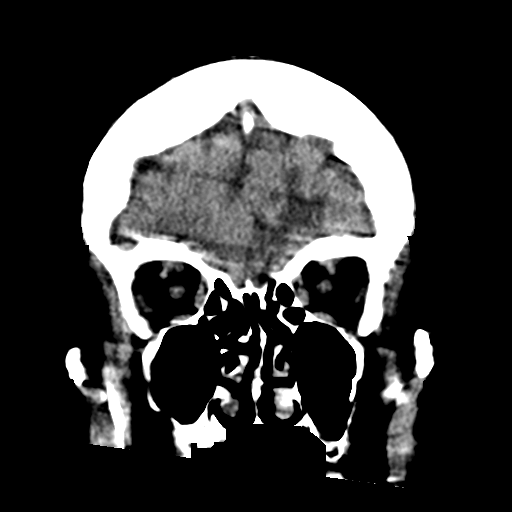
[im 33/74  brain]
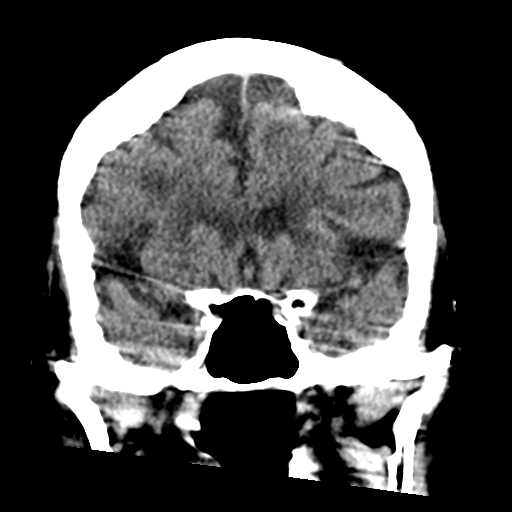
[im 41/74  brain]
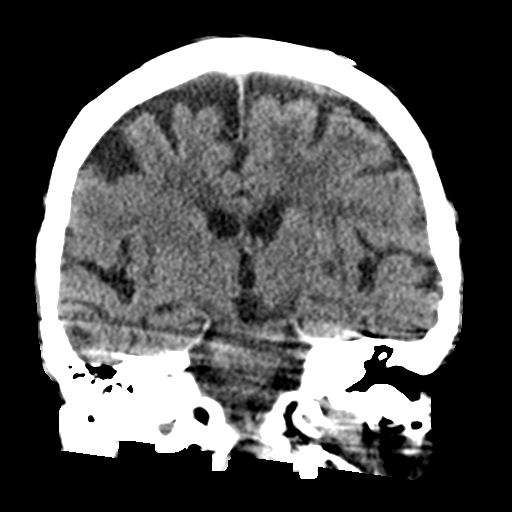

[Series 6: sagittal soft · sagittal · 0.34mm/px · 3 of 51 slices shown]
[im 17/51  brain]
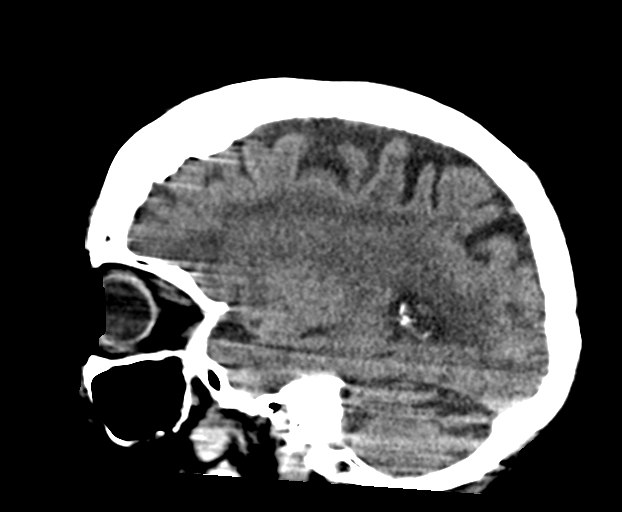
[im 26/51  brain]
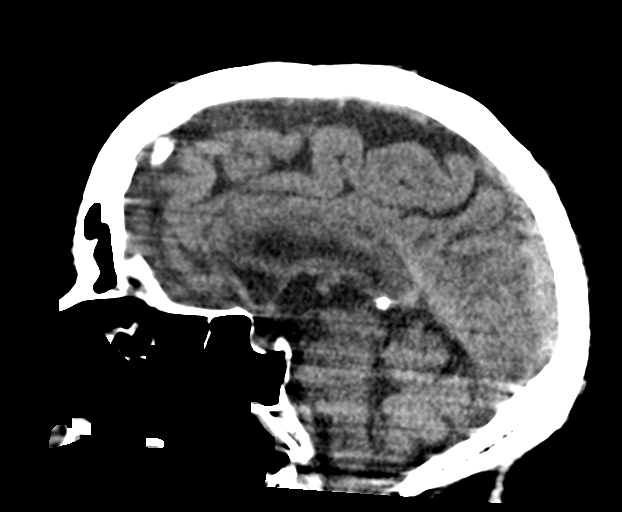
[im 34/51  brain]
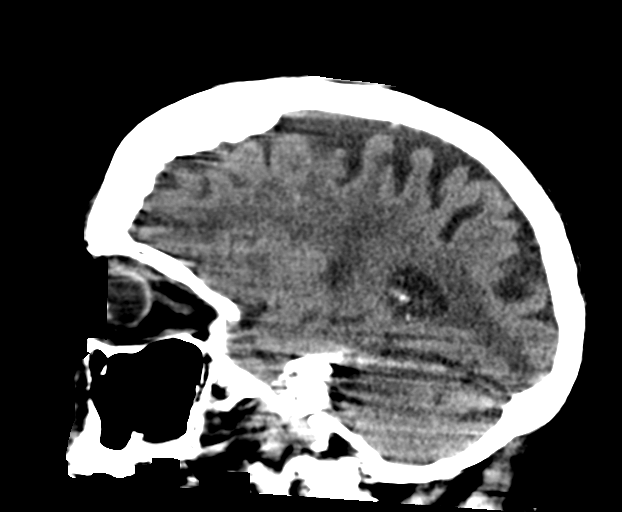

[Series 8: head w o · axial · 0.39mm/px · z∈[+1458,+1493]mm · 3 of 30 slices shown (2 of 2)]
[im 4/30  brain]
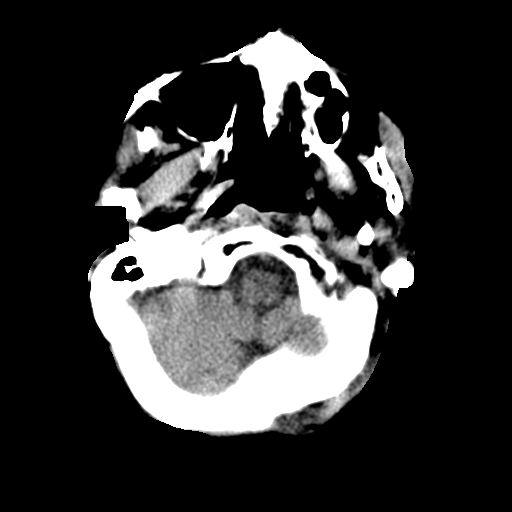
[im 8/30  brain]
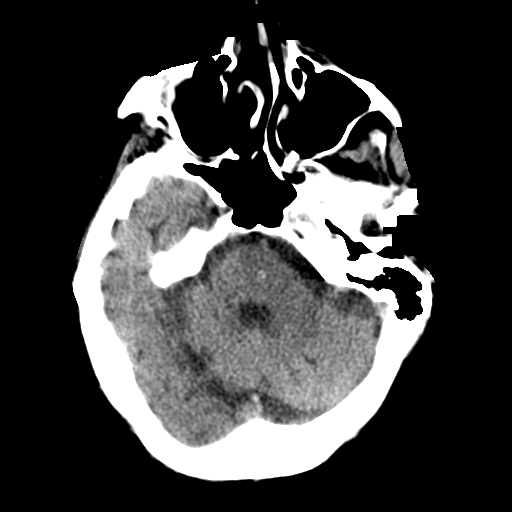
[im 11/30  brain]
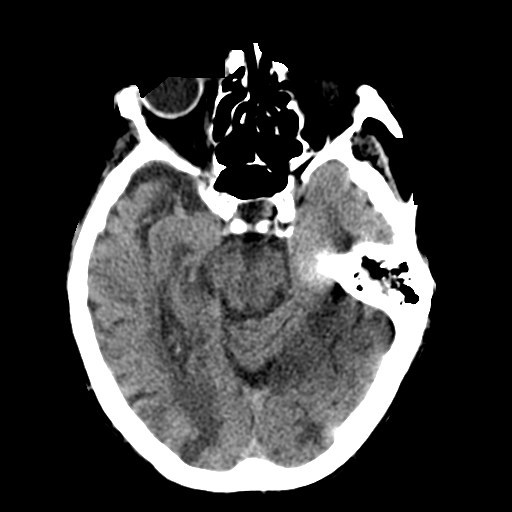

[16 of 47 positions shown; findings below may reference images not displayed]

FINDINGS: Significant motion degradation despite best attempts of the
technologist. The study is of marginal diagnostic utility.

Brain: Advanced atrophy with multilobar infarcts including LEFT
frontal, LEFT posterior limb internal capsule and white matter,
RIGHT basal ganglia, and RIGHT occipital lobes, but no definite
acute infarct, hemorrhage, mass lesion, hydrocephalus, or
extra-axial fluid. Advanced chronic microvascular ischemic change is
superimposed.

Vascular: Calcification of the cavernous internal carotid arteries
consistent with cerebrovascular atherosclerotic disease. No signs of
intracranial large vessel occlusion.

Skull: Calvarium grossly intact.

Sinuses/Orbits: No acute finding.

Other: None
IMPRESSION: Significantly motion degraded scan demonstrates no definite acute
finding. One can be fairly confident that no hemorrhage is present.

Compared with priors, age advanced atrophy, chronic multilobar
infarcts, and small vessel disease appear grossly stable.

## 2019-08-12 IMAGING — DX DG ABDOMEN ACUTE W/ 1V CHEST
1 series · 1 of 1 positions shown · non-contrast
Comparison: 03/28/2018.

CLINICAL DATA: Nausea and vomiting. Combative patient.

EXAM:
DG ABDOMEN ACUTE W/ 1V CHEST

[abdomen kub]
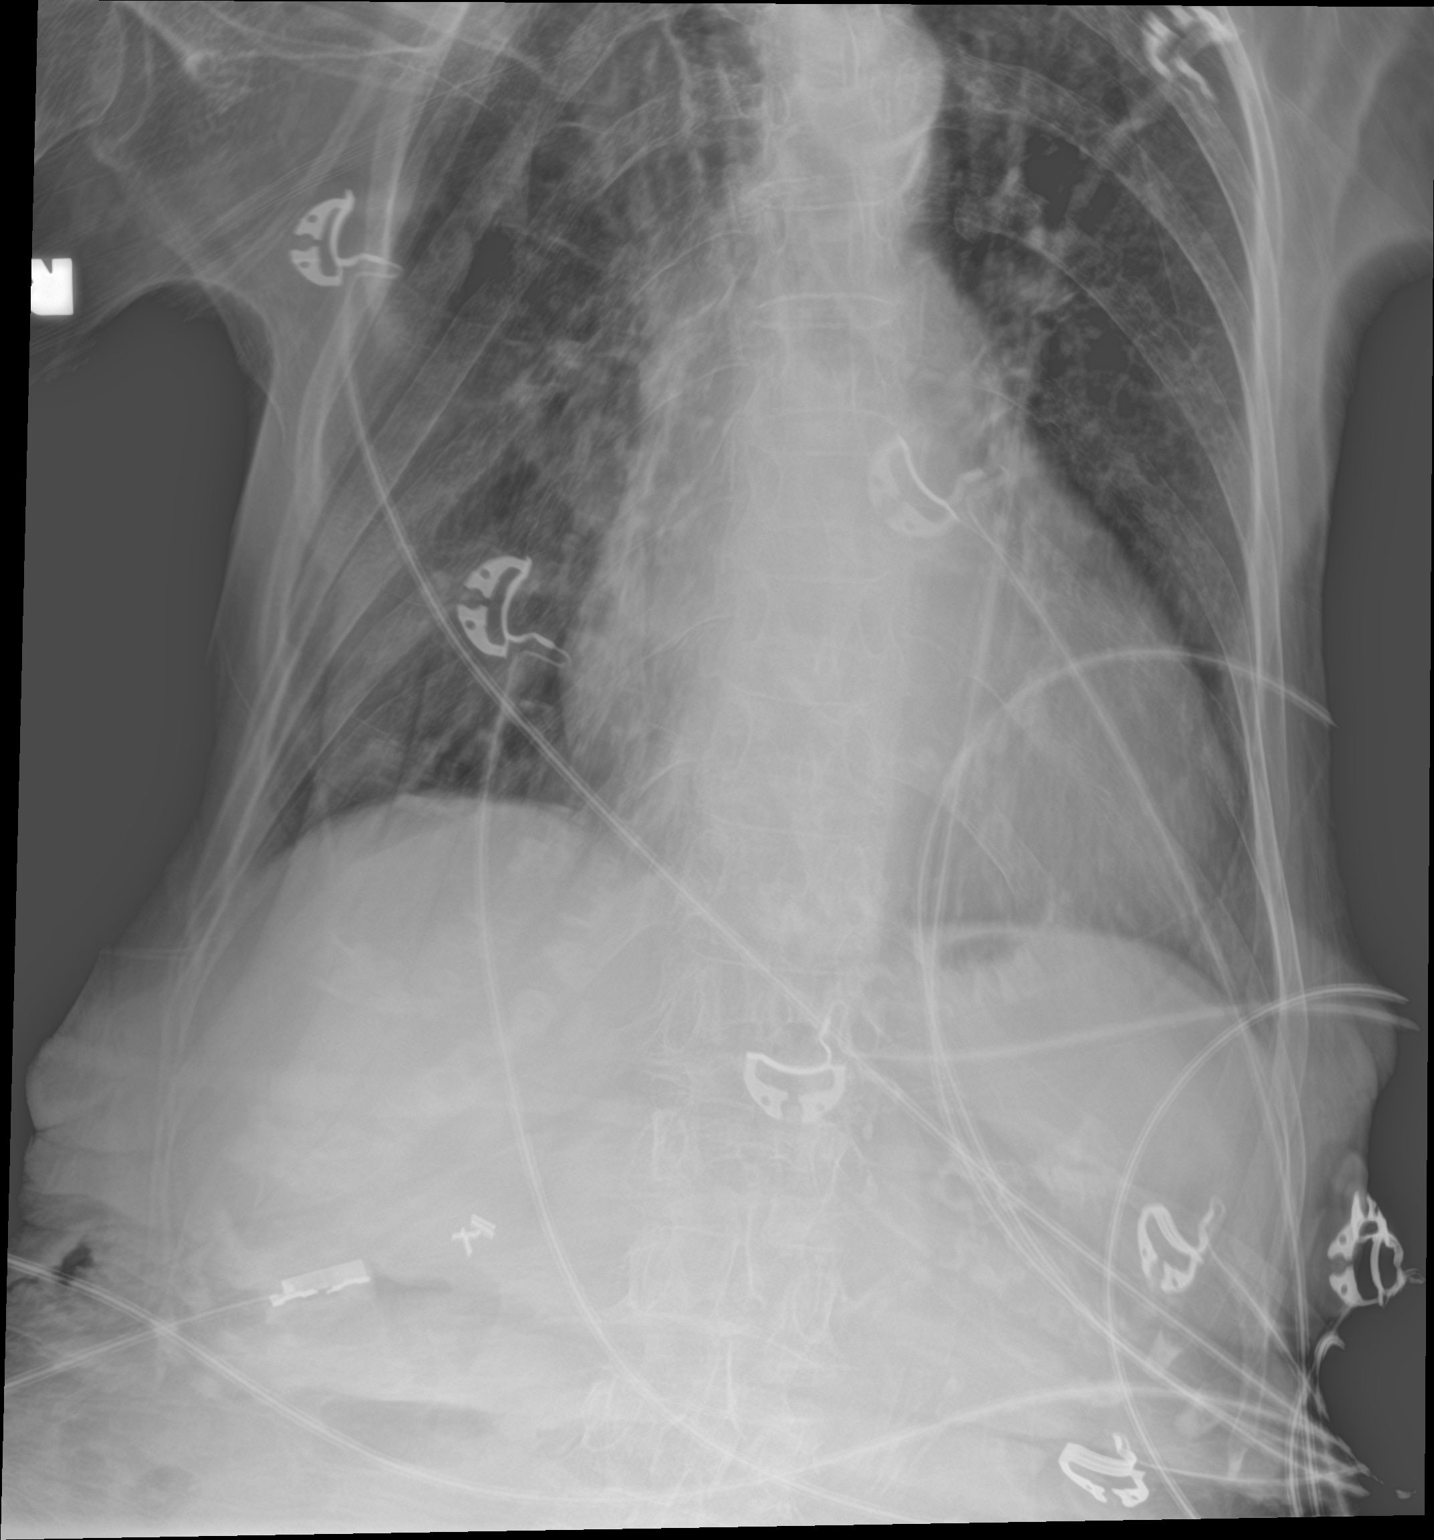

[1 of 1 positions shown; findings below may reference images not displayed]

FINDINGS: The heart is enlarged. There is mild vascular congestion. Skeletal
osteopenia is noted. There is calcified thoracic and abdominal
aorta. There is a moderate stool burden but no obstruction or free
air. Cholecystectomy clips. Calcified fibroids. Previous RIGHT hip
replacement. Chronic radiodense LEFT mid abdominal calcification,
stable.
IMPRESSION: 1. Cardiomegaly with mild vascular congestion.
2. Moderate stool burden without obstruction or free air.
